# Patient Record
Sex: Male | Born: 1950 | Race: Black or African American | Hispanic: No | Marital: Married | Smoking: Current every day smoker
Health system: Southern US, Community
[De-identification: ages and names within clinical notes are randomized; demographics above are authoritative.]

## PROBLEM LIST (undated history)

## (undated) DIAGNOSIS — I10 Essential (primary) hypertension: Secondary | ICD-10-CM

## (undated) DIAGNOSIS — I639 Cerebral infarction, unspecified: Secondary | ICD-10-CM

## (undated) HISTORY — PX: TOTAL HIP ARTHROPLASTY: SHX124

---

## 1998-07-22 ENCOUNTER — Encounter: Admission: RE | Admit: 1998-07-22 | Discharge: 1998-08-04 | Payer: Self-pay

## 2003-02-20 ENCOUNTER — Encounter: Payer: Self-pay | Admitting: Occupational Medicine

## 2003-02-20 ENCOUNTER — Encounter: Admission: RE | Admit: 2003-02-20 | Discharge: 2003-02-20 | Payer: Self-pay | Admitting: Occupational Medicine

## 2004-05-20 ENCOUNTER — Ambulatory Visit: Payer: Self-pay | Admitting: Urology

## 2005-01-14 ENCOUNTER — Ambulatory Visit: Payer: Self-pay | Admitting: Urology

## 2007-03-15 ENCOUNTER — Ambulatory Visit: Payer: Self-pay | Admitting: General Practice

## 2007-04-04 ENCOUNTER — Ambulatory Visit: Payer: Self-pay | Admitting: Orthopaedic Surgery

## 2007-04-12 ENCOUNTER — Ambulatory Visit: Payer: Self-pay | Admitting: Orthopaedic Surgery

## 2007-04-18 ENCOUNTER — Ambulatory Visit: Payer: Self-pay | Admitting: Orthopaedic Surgery

## 2011-03-09 ENCOUNTER — Emergency Department: Payer: Self-pay | Admitting: Emergency Medicine

## 2011-03-12 ENCOUNTER — Emergency Department: Payer: Self-pay | Admitting: Internal Medicine

## 2012-01-04 ENCOUNTER — Ambulatory Visit: Payer: Self-pay | Admitting: Unknown Physician Specialty

## 2013-06-28 ENCOUNTER — Ambulatory Visit: Payer: Self-pay | Admitting: Unknown Physician Specialty

## 2017-03-25 ENCOUNTER — Encounter: Payer: Self-pay | Admitting: Emergency Medicine

## 2017-03-25 ENCOUNTER — Emergency Department
Admission: EM | Admit: 2017-03-25 | Discharge: 2017-03-25 | Disposition: A | Discharge status: Home / Self Care | Payer: BLUE CROSS/BLUE SHIELD | Attending: Emergency Medicine | Admitting: Emergency Medicine

## 2017-03-25 DIAGNOSIS — H9203 Otalgia, bilateral: Secondary | ICD-10-CM | POA: Diagnosis present

## 2017-03-25 DIAGNOSIS — F17210 Nicotine dependence, cigarettes, uncomplicated: Secondary | ICD-10-CM | POA: Insufficient documentation

## 2017-03-25 DIAGNOSIS — H60503 Unspecified acute noninfective otitis externa, bilateral: Secondary | ICD-10-CM | POA: Diagnosis not present

## 2017-03-25 DIAGNOSIS — J019 Acute sinusitis, unspecified: Secondary | ICD-10-CM | POA: Insufficient documentation

## 2017-03-25 MED ORDER — AMOXICILLIN-POT CLAVULANATE 875-125 MG PO TABS
1.0000 | ORAL_TABLET | Freq: Once | ORAL | Status: AC
  Administered 2017-03-25: 1 via ORAL
  Filled 2017-03-25: qty 1

## 2017-03-25 MED ORDER — CIPROFLOXACIN-DEXAMETHASONE 0.3-0.1 % OT SUSP
4.0000 [drp] | Freq: Once | OTIC | Status: AC
  Administered 2017-03-25: 4 [drp] via OTIC
  Filled 2017-03-25: qty 7.5

## 2017-03-25 MED ORDER — AMOXICILLIN-POT CLAVULANATE 875-125 MG PO TABS: 1.0000 | ORAL_TABLET | Freq: Two times a day (BID) | ORAL | 0 refills | Status: AC

## 2017-03-25 NOTE — ED Notes (Signed)
Reviewed d/c instructions, follow-up care, prescriptions with patient. Patient verbalized understanding.  

## 2017-03-25 NOTE — ED Provider Notes (Signed)
[facility] Provider Note    First MD Initiated Contact with Patient 03/25/17 [phone]     (approximate)  I have reviewed the triage vital signs and the nursing notes.   HISTORY  Chief Complaint Otalgia   HPI Mark Durham is a 66 y.o. male presents to the emergency department with bilateral earache 1 week as well as nasal congestion. Patient denies any cough no dyspnea. Patient denies any fever. Patient states that there are "sores" in his ear.Patient states his current pain score is 10 out of 10   Past medical history none There are no active problems to display for this patient.   Past surgical history None  Prior to Admission medications   Medication Sig Start Date End Date Taking? Authorizing Provider  amoxicillin-clavulanate (AUGMENTIN) 875-125 MG tablet Take 1 tablet by mouth 2 (two) times daily. 03/25/17 04/04/17  Darci Current, MD    Allergies no known drug allergies  No family history on file.  Social History Social History  Substance Use Topics  . Smoking status: Current Every Day Smoker  . Smokeless tobacco: Never Used  . Alcohol use No    Review of Systems Constitutional: No fever/chills Eyes: No visual changes. ENT: No sore throat. Cardiovascular: Denies chest pain. Respiratory: Denies shortness of breath. Gastrointestinal: No abdominal pain.  No nausea, no vomiting.  No diarrhea.  No constipation. Genitourinary: Negative for dysuria. Musculoskeletal: Negative for neck pain.  Negative for back pain. Integumentary: Negative for rash. Neurological: Negative for headaches, focal weakness or numbness.   ____________________________________________   PHYSICAL EXAM:  VITAL SIGNS: ED Triage Vitals  Enc Vitals Group     BP 03/25/17 0431 (!) 173/105     Pulse Rate 03/25/17 0431 96     Resp 03/25/17 0431 19     Temp 03/25/17 0432 98.6 F (37 C)     Temp Source 03/25/17 0432 Oral     SpO2  03/25/17 0431 97 %     Weight 03/25/17 0426 97.5 kg (215 lb)     Height 03/25/17 0426 1.778 m ( )     Head Circumference --      Peak Flow --      Pain Score 03/25/17 0426 10     Pain Loc --      Pain Edu? --      Excl. in GC? --     Constitutional: Alert and oriented. Well appearing and in no acute distress. Eyes: Conjunctivae are normal.  Head: Atraumatic. Ears:  external auditory canal erythema and exudate noted bilaterally. Nose: No congestion/rhinnorhea. Mouth/Throat: Mucous membranes are moist.  Oropharynx non-erythematous. Neck: No stridor.   Cardiovascular: Normal rate, regular rhythm. Good peripheral circulation. Grossly normal heart sounds. Respiratory: Normal respiratory effort.  No retractions. Lungs CTAB. Gastrointestinal: Soft and nontender. No distention.  Musculoskeletal: No lower extremity tenderness nor edema. No gross deformities of extremities. Neurologic:  Normal speech and language. No gross focal neurologic deficits are appreciated.  Skin:  Skin is warm, dry and intact. No rash noted.    Procedures   ____________________________________________   INITIAL IMPRESSION / ASSESSMENT AND PLAN / ED COURSE  Pertinent labs & imaging results that were available during my care of the patient were reviewed by me and considered in my medical decision making (see chart for details).  Ciprodex and Augmentin given.      ____________________________________________  FINAL CLINICAL IMPRESSION(S) / ED DIAGNOSES  Final diagnoses:  Acute otitis externa  of both ears, unspecified type  Acute sinusitis, recurrence not specified, unspecified location     MEDICATIONS GIVEN DURING THIS VISIT:  Medications  ciprofloxacin-dexamethasone (CIPRODEX) 0.3-0.1 % OTIC (EAR) suspension 4 drop (not administered)  amoxicillin-clavulanate (AUGMENTIN) 875-125 MG per tablet 1 tablet (not administered)     NEW OUTPATIENT MEDICATIONS STARTED DURING THIS VISIT:  New  Prescriptions   AMOXICILLIN-CLAVULANATE (AUGMENTIN) 875-125 MG TABLET    Take 1 tablet by mouth 2 (two) times daily.    Modified Medications   No medications on file    Discontinued Medications   No medications on file     Note:  This document was prepared using Dragon voice recognition software and may include unintentional dictation errors.    Darci Current, MD 03/25/17 [phone]

## 2017-03-25 NOTE — ED Triage Notes (Signed)
Patient ambulatory to triage with steady gait, without difficulty or distress noted; pt reports bilat earache x week with no recent illness; st "feels stopped up"

## 2017-09-20 ENCOUNTER — Encounter: Payer: Self-pay | Admitting: Emergency Medicine

## 2017-09-20 ENCOUNTER — Other Ambulatory Visit: Payer: Self-pay

## 2017-09-20 DIAGNOSIS — R2231 Localized swelling, mass and lump, right upper limb: Secondary | ICD-10-CM | POA: Insufficient documentation

## 2017-09-20 DIAGNOSIS — Z5321 Procedure and treatment not carried out due to patient leaving prior to being seen by health care provider: Secondary | ICD-10-CM | POA: Diagnosis not present

## 2017-09-20 LAB — CBC WITH DIFFERENTIAL/PLATELET
BASOS ABS: 0.1 10*3/uL (ref 0–0.1)
BASOS PCT: 1 %
Eosinophils Absolute: 0.2 10*3/uL (ref 0–0.7)
Eosinophils Relative: 2 %
HCT: 42.8 % (ref 40.0–52.0)
Hemoglobin: 14 g/dL (ref 13.0–18.0)
Lymphocytes Relative: 34 %
Lymphs Abs: 2.9 10*3/uL (ref 1.0–3.6)
MCH: 27.5 pg (ref 26.0–34.0)
MCHC: 32.7 g/dL (ref 32.0–36.0)
MCV: 84 fL (ref 80.0–100.0)
MONO ABS: 0.6 10*3/uL (ref 0.2–1.0)
Monocytes Relative: 7 %
NEUTROS ABS: 4.8 10*3/uL (ref 1.4–6.5)
NEUTROS PCT: 56 %
Platelets: 260 10*3/uL (ref 150–440)
RBC: 5.1 MIL/uL (ref 4.40–5.90)
RDW: 14.2 % (ref 11.5–14.5)
WBC: 8.5 10*3/uL (ref 3.8–10.6)

## 2017-09-20 NOTE — ED Triage Notes (Signed)
Patient ambulatory to triage with steady gait, without difficulty or distress noted; pt reports possible spider bite after moving boxes last wk; area of redness noted to right FA just above wrist with scabbed center; area of redness marked in triage

## 2017-09-21 ENCOUNTER — Emergency Department
Admission: EM | Admit: 2017-09-21 | Discharge: 2017-09-21 | Disposition: A | Discharge status: Home / Self Care | Payer: BLUE CROSS/BLUE SHIELD | Attending: Emergency Medicine | Admitting: Emergency Medicine

## 2017-09-21 LAB — COMPREHENSIVE METABOLIC PANEL
ALT: 24 U/L (ref 17–63)
ANION GAP: 10 (ref 5–15)
AST: 23 U/L (ref 15–41)
Albumin: 3.9 g/dL (ref 3.5–5.0)
Alkaline Phosphatase: 98 U/L (ref 38–126)
BILIRUBIN TOTAL: 0.7 mg/dL (ref 0.3–1.2)
BUN: 15 mg/dL (ref 6–20)
CO2: 26 mmol/L (ref 22–32)
Calcium: 8.9 mg/dL (ref 8.9–10.3)
Chloride: 104 mmol/L (ref 101–111)
Creatinine, Ser: 1.18 mg/dL (ref 0.61–1.24)
GFR calc Af Amer: >60 mL/min (ref >60)
Glucose, Bld: 111 mg/dL — ABNORMAL HIGH (ref 65–99)
POTASSIUM: 3.4 mmol/L — AB (ref 3.5–5.1)
Sodium: 140 mmol/L (ref 135–145)
TOTAL PROTEIN: 8.3 g/dL — AB (ref 6.5–8.1)

## 2018-02-14 DIAGNOSIS — Z86010 Personal history of colonic polyps: Secondary | ICD-10-CM | POA: Insufficient documentation

## 2018-02-14 DIAGNOSIS — Z8719 Personal history of other diseases of the digestive system: Secondary | ICD-10-CM | POA: Insufficient documentation

## 2018-11-13 DIAGNOSIS — M179 Osteoarthritis of knee, unspecified: Secondary | ICD-10-CM | POA: Insufficient documentation

## 2019-11-23 DIAGNOSIS — G8929 Other chronic pain: Secondary | ICD-10-CM | POA: Insufficient documentation

## 2020-01-24 DIAGNOSIS — M1611 Unilateral primary osteoarthritis, right hip: Secondary | ICD-10-CM | POA: Insufficient documentation

## 2020-03-04 DIAGNOSIS — Z96641 Presence of right artificial hip joint: Secondary | ICD-10-CM | POA: Insufficient documentation

## 2020-07-31 ENCOUNTER — Encounter: Payer: Self-pay | Admitting: Emergency Medicine

## 2020-07-31 ENCOUNTER — Emergency Department
Admission: EM | Admit: 2020-07-31 | Discharge: 2020-07-31 | Disposition: A | Discharge status: Home / Self Care | Payer: BC Managed Care – PPO | Attending: Emergency Medicine | Admitting: Emergency Medicine

## 2020-07-31 ENCOUNTER — Other Ambulatory Visit: Payer: Self-pay

## 2020-07-31 ENCOUNTER — Emergency Department: Payer: BC Managed Care – PPO

## 2020-07-31 DIAGNOSIS — Z96641 Presence of right artificial hip joint: Secondary | ICD-10-CM | POA: Insufficient documentation

## 2020-07-31 DIAGNOSIS — Z79899 Other long term (current) drug therapy: Secondary | ICD-10-CM | POA: Insufficient documentation

## 2020-07-31 DIAGNOSIS — I10 Essential (primary) hypertension: Secondary | ICD-10-CM | POA: Diagnosis not present

## 2020-07-31 DIAGNOSIS — H60332 Swimmer's ear, left ear: Secondary | ICD-10-CM | POA: Insufficient documentation

## 2020-07-31 DIAGNOSIS — R42 Dizziness and giddiness: Secondary | ICD-10-CM | POA: Insufficient documentation

## 2020-07-31 DIAGNOSIS — F17200 Nicotine dependence, unspecified, uncomplicated: Secondary | ICD-10-CM | POA: Insufficient documentation

## 2020-07-31 DIAGNOSIS — H9202 Otalgia, left ear: Secondary | ICD-10-CM | POA: Diagnosis present

## 2020-07-31 HISTORY — DX: Essential (primary) hypertension: I10

## 2020-07-31 LAB — COMPREHENSIVE METABOLIC PANEL
ALT: 16 U/L (ref 0–44)
AST: 16 U/L (ref 15–41)
Albumin: 3.5 g/dL (ref 3.5–5.0)
Alkaline Phosphatase: 106 U/L (ref 38–126)
Anion gap: 8 (ref 5–15)
BUN: 14 mg/dL (ref 8–23)
CO2: 27 mmol/L (ref 22–32)
Calcium: 9.3 mg/dL (ref 8.9–10.3)
Chloride: 106 mmol/L (ref 98–111)
Creatinine, Ser: 0.9 mg/dL (ref 0.61–1.24)
GFR, Estimated: >60 mL/min (ref >60)
Glucose, Bld: 105 mg/dL — ABNORMAL HIGH (ref 70–99)
Potassium: 3.2 mmol/L — ABNORMAL LOW (ref 3.5–5.1)
Sodium: 141 mmol/L (ref 135–145)
Total Bilirubin: 0.9 mg/dL (ref 0.3–1.2)
Total Protein: 8 g/dL (ref 6.5–8.1)

## 2020-07-31 LAB — CBC WITH DIFFERENTIAL/PLATELET
Abs Immature Granulocytes: 0.02 10*3/uL (ref 0.00–0.07)
Basophils Absolute: 0 10*3/uL (ref 0.0–0.1)
Basophils Relative: 0 %
Eosinophils Absolute: 0.2 10*3/uL (ref 0.0–0.5)
Eosinophils Relative: 3 %
HCT: 37.7 % — ABNORMAL LOW (ref 39.0–52.0)
Hemoglobin: 11.3 g/dL — ABNORMAL LOW (ref 13.0–17.0)
Immature Granulocytes: 0 %
Lymphocytes Relative: 39 %
Lymphs Abs: 2.8 10*3/uL (ref 0.7–4.0)
MCH: 23 pg — ABNORMAL LOW (ref 26.0–34.0)
MCHC: 30 g/dL (ref 30.0–36.0)
MCV: 76.8 fL — ABNORMAL LOW (ref 80.0–100.0)
Monocytes Absolute: 0.8 10*3/uL (ref 0.1–1.0)
Monocytes Relative: 11 %
Neutro Abs: 3.4 10*3/uL (ref 1.7–7.7)
Neutrophils Relative %: 47 %
Platelets: 269 10*3/uL (ref 150–400)
RBC: 4.91 MIL/uL (ref 4.22–5.81)
RDW: 15.6 % — ABNORMAL HIGH (ref 11.5–15.5)
WBC: 7.3 10*3/uL (ref 4.0–10.5)
nRBC: 0 % (ref 0.0–0.2)

## 2020-07-31 LAB — APTT: aPTT: 32 seconds (ref 24–36)

## 2020-07-31 LAB — TROPONIN I (HIGH SENSITIVITY)
Troponin I (High Sensitivity): 7 ng/L (ref <18)
Troponin I (High Sensitivity): 7 ng/L (ref <18)

## 2020-07-31 LAB — PROTIME-INR
INR: 1.1 (ref 0.8–1.2)
Prothrombin Time: 14.1 seconds (ref 11.4–15.2)

## 2020-07-31 MED ORDER — FENTANYL CITRATE (PF) 100 MCG/2ML IJ SOLN
50.0000 ug | Freq: Once | INTRAMUSCULAR | Status: AC
  Administered 2020-07-31: 50 ug via INTRAVENOUS
  Filled 2020-07-31: qty 2

## 2020-07-31 MED ORDER — ONDANSETRON HCL 4 MG/2ML IJ SOLN
4.0000 mg | Freq: Once | INTRAMUSCULAR | Status: AC
  Administered 2020-07-31: 4 mg via INTRAVENOUS
  Filled 2020-07-31: qty 2

## 2020-07-31 MED ORDER — CIPROFLOXACIN-DEXAMETHASONE 0.3-0.1 % OT SUSP: 4.0000 [drp] | Freq: Two times a day (BID) | OTIC | 0 refills | Status: AC

## 2020-07-31 MED ORDER — AMLODIPINE BESYLATE 5 MG PO TABS: 5.0000 mg | ORAL_TABLET | Freq: Every day | ORAL | 11 refills | Status: DC

## 2020-07-31 MED ORDER — IOHEXOL 350 MG/ML SOLN
75.0000 mL | Freq: Once | INTRAVENOUS | Status: AC | PRN
  Administered 2020-07-31: 75 mL via INTRAVENOUS

## 2020-07-31 NOTE — ED Provider Notes (Signed)
[facility] Emergency Department Provider Note   ____________________________________________   Event Date/Time   First MD Initiated Contact with Patient 07/31/20 [phone]     (approximate)  I have reviewed the triage vital signs and the nursing notes.   HISTORY  Chief Complaint No chief complaint on file.    HPI Mark Durham is a 70 y.o. male with a stated past medical history of hypertension who presents for left earache.  Patient states that his left ear has been aching/throbbing for the last 2 days.  Patient states this pain has been worsening over this time and is now 8/10 and up the left side of his head.  Patient has been trying over-the-counter eardrops for symptomatic control with minimal improvement.  Patient states that palpation worsens this pain.  Patient also endorses transient episodic orthostatic lightheadedness.  Patient states that it lasts approximately 2-4 minutes and resolve spontaneously after standing up from seated position.  Patient states that he is only on lisinopril for his blood pressure and he does take it every day.  Patient states that he takes his blood pressure every day and notes that the systolic blood pressures are usually 180-220.  Patient currently denies any vision changes, tinnitus, difficulty speaking, facial droop, sore throat, chest pain, shortness of breath, abdominal pain, nausea/vomiting/diarrhea, dysuria, or weakness/numbness/paresthesias in any extremity         Past Medical History:  Diagnosis Date  . Hypertension     There are no problems to display for this patient.   Past Surgical History:  Procedure Laterality Date  . TOTAL HIP ARTHROPLASTY Right     Prior to Admission medications   Medication Sig Start Date End Date Taking? Authorizing Provider  amLODipine (NORVASC) 5 MG tablet Take 1 tablet (5 mg total) by mouth daily. 07/31/20 07/31/21 Yes Johathon Overturf, Clent Jacks, MD  ciprofloxacin-dexamethasone  (CIPRODEX) OTIC suspension Place 4 drops into the left ear 2 (two) times daily for 10 days. 07/31/20 08/10/20 Yes Merwyn Katos, MD    Allergies Patient has no known allergies.  No family history on file.  Social History Social History   Tobacco Use  . Smoking status: Current Every Day Smoker  . Smokeless tobacco: Never Used  Vaping Use  . Vaping Use: Never used  Substance Use Topics  . Alcohol use: No    Review of Systems Constitutional: No fever/chills Eyes: No visual changes. ENT: No sore throat. Ears: Endorses left ear pain Cardiovascular: Denies chest pain. Respiratory: Denies shortness of breath. Gastrointestinal: No abdominal pain.  No nausea, no vomiting.  No diarrhea. Genitourinary: Negative for dysuria. Musculoskeletal: Negative for acute arthralgias Skin: Negative for rash. Neurological: Negative for headaches, weakness/numbness/paresthesias in any extremity Psychiatric: Negative for suicidal ideation/homicidal ideation   ____________________________________________   PHYSICAL EXAM:  VITAL SIGNS: ED Triage Vitals  Enc Vitals Group     BP 07/31/20 0342 (!) 203/110     Pulse Rate 07/31/20 0342 88     Resp 07/31/20 0342 18     Temp 07/31/20 0342 97.8 F (36.6 C)     Temp Source 07/31/20 0342 Oral     SpO2 07/31/20 0342 96 %     Weight 07/31/20 0346 220 lb (99.8 kg)     Height 07/31/20 0346 5\' 10"  (1.778 m)     Head Circumference --      Peak Flow --      Pain Score 07/31/20 0345 10     Pain Loc --  Pain Edu? --      Excl. in GC? --    Constitutional: Alert and oriented. Well appearing and in no acute distress. Eyes: Conjunctivae are normal. PERRL. Head: Atraumatic. Ears: Left ear with external auditory canal erythematous, edematous, indurated, and with severe tenderness to palpation Nose: No congestion/rhinnorhea. Mouth/Throat: Mucous membranes are moist. Neck: No stridor Cardiovascular: Grossly normal heart sounds.  Good peripheral  circulation. Respiratory: Normal respiratory effort.  No retractions. Gastrointestinal: Soft and nontender. No distention. Musculoskeletal: No obvious deformities Neurologic:  Normal speech and language. No gross focal neurologic deficits are appreciated. Skin:  Skin is warm and dry. No rash noted. Psychiatric: Mood and affect are normal. Speech and behavior are normal.  ____________________________________________   LABS (all labs ordered are listed, but only abnormal results are displayed)  Labs Reviewed  CBC WITH DIFFERENTIAL/PLATELET - Abnormal; Notable for the following components:      Result Value   Hemoglobin 11.3 (*)    HCT 37.7 (*)    MCV 76.8 (*)    MCH 23.0 (*)    RDW 15.6 (*)    All other components within normal limits  COMPREHENSIVE METABOLIC PANEL - Abnormal; Notable for the following components:   Potassium 3.2 (*)    Glucose, Bld 105 (*)    All other components within normal limits  PROTIME-INR  APTT  TROPONIN I (HIGH SENSITIVITY)  TROPONIN I (HIGH SENSITIVITY)   ____________________________________________  EKG  ED ECG REPORT I, Merwyn KatosEvan K Torina Ey, the attending physician, personally viewed and interpreted this ECG.  Date: 07/31/2020 EKG Time: 0343 Rate: 85 Rhythm: normal sinus rhythm QRS Axis: normal Intervals: normal ST/T Wave abnormalities: normal Narrative Interpretation: no evidence of acute ischemia  ____________________________________________  RADIOLOGY  ED MD interpretation: CT angiography of the head and neck shows high-grade left V4, bilateral MCA, and left PCA narrowings  CT without contrast of the head shows no evidence of acute abnormalities including no active hemorrhage, edema, or obvious mass.  Patient does show signs of chronic microvascular disease  Official radiology report(s): CT Angio Head W or Wo Contrast  Result Date: 07/31/2020 CLINICAL DATA:  Nonspecific dizziness.  Headache. EXAM: CT ANGIOGRAPHY HEAD AND NECK  TECHNIQUE: Multidetector CT imaging of the head and neck was performed using the standard protocol during bolus administration of intravenous contrast. Multiplanar CT image reconstructions and MIPs were obtained to evaluate the vascular anatomy. Carotid stenosis measurements (when applicable) are obtained utilizing NASCET criteria, using the distal internal carotid diameter as the denominator. CONTRAST:  75mL OMNIPAQUE IOHEXOL 350 MG/ML SOLN COMPARISON:  Head CT 03/10/2011 FINDINGS: CT HEAD FINDINGS Brain: No evidence of acute infarction, hemorrhage, hydrocephalus, extra-axial collection or mass lesion/mass effect. Brain atrophy with ventriculomegaly, progressed from 2012. Chronic small vessel ischemic type low-density in the cerebral white matter which is confluent in some locations, also progressed. Vascular: See below Skull: Normal. Negative for fracture or focal lesion. Sinuses: Clear. Orbits: History of left ear pain. No convincing mastoid or middle ear opacification. Review of the MIP images confirms the above findings CTA NECK FINDINGS Aortic arch: No acute finding. Normal diameter. Coronary atherosclerosis is present. Right carotid system: Low-density atheromatous wall thickening of the common carotid and proximal ICA. There is a medially directed outpouching from the proximal left ICA which measures 3 mm in diameter. No dissection or flow limiting stenosis. Left carotid system: Atheromatous wall thickening of the common carotid and proximal ICA. No stenosis or ulceration. Vertebral arteries: Mild atheromatous changes to the proximal subclavian arteries. Right  vertebral artery dominance. No vertebral dissection, beading, or flow limiting stenosis. Skeleton: Ordinary degenerative changes in the cervical spine. Other neck: Larger right thyroid lobe without discrete visualized mass. Upper chest: No acute finding. Paraseptal emphysema. Partially covered nodule in the left upper lobe measuring 8 mm. There is an  internal calcification which appears small and eccentric based on the coverage. Review of the MIP images confirms the above findings CTA HEAD FINDINGS Anterior circulation: Diffuse atheromatous change to the carotid siphons. No measured flow reducing stenosis. High-grade bilateral M1 segment stenosis fusiform appearance attributed to atherosclerosis, see coronal reformats. No major branch occlusion is seen. Evaluation of medium and distal vessels is limited by venous timing. Posterior circulation: Right dominant vertebral artery. Atheromatous irregularity of the left V4 segment with high-grade stenosis before the basilar. Mild atheromatous narrowing of the basilar. Bilateral PE a 2/3 segment stenosis, high-grade appearing on the left. No major branch occlusion or aneurysm seen. Venous sinuses: Diffusely patent Anatomic variants: None significant Review of the MIP images confirms the above findings IMPRESSION: Head CT: 1. No acute finding. 2. 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 CTA of the neck: 1. No emergent finding. 2. Atherosclerosis of the cervical carotids without flow reducing stenosis. There is a notable outpouching from the right ICA bulb consistent with ulcerated plaque. 3. Partially covered 8 mm nodule in the left upper lobe. There is an internal calcification favoring granulomatous disease, but partially covered. Recommend non emergent chest CT without contrast. CTA of the head: 1. No emergent finding. 2. Atherosclerosis with high-grade left V4, bilateral MCA and left PCA narrowings. Electronically Signed   By: Marnee Spring M.D.   On: 07/31/2020 06:21   CT Angio Neck W and/or Wo Contrast  Result Date: 07/31/2020 CLINICAL DATA:  Nonspecific dizziness.  Headache. EXAM: CT ANGIOGRAPHY HEAD AND NECK TECHNIQUE: Multidetector CT imaging of the head and neck was performed using the standard protocol during bolus administration of intravenous  contrast. Multiplanar CT image reconstructions and MIPs were obtained to evaluate the vascular anatomy. Carotid stenosis measurements (when applicable) are obtained utilizing NASCET criteria, using the distal internal carotid diameter as the denominator. CONTRAST:  16mL OMNIPAQUE IOHEXOL 350 MG/ML SOLN COMPARISON:  Head CT 03/10/2011 FINDINGS: CT HEAD FINDINGS Brain: No evidence of acute infarction, hemorrhage, hydrocephalus, extra-axial collection or mass lesion/mass effect. Brain atrophy with ventriculomegaly, progressed from 2012. Chronic small vessel ischemic type low-density in the cerebral white matter which is confluent in some locations, also progressed. Vascular: See below Skull: Normal. Negative for fracture or focal lesion. Sinuses: Clear. Orbits: History of left ear pain. No convincing mastoid or middle ear opacification. Review of the MIP images confirms the above findings CTA NECK FINDINGS Aortic arch: No acute finding. Normal diameter. Coronary atherosclerosis is present. Right carotid system: Low-density atheromatous wall thickening of the common carotid and proximal ICA. There is a medially directed outpouching from the proximal left ICA which measures 3 mm in diameter. No dissection or flow limiting stenosis. Left carotid system: Atheromatous wall thickening of the common carotid and proximal ICA. No stenosis or ulceration. Vertebral arteries: Mild atheromatous changes to the proximal subclavian arteries. Right vertebral artery dominance. No vertebral dissection, beading, or flow limiting stenosis. Skeleton: Ordinary degenerative changes in the cervical spine. Other neck: Larger right thyroid lobe without discrete visualized mass. Upper chest: No acute finding. Paraseptal emphysema. Partially covered nodule in the left upper lobe measuring 8 mm. There is an internal calcification which appears small  and eccentric based on the coverage. Review of the MIP images confirms the above findings CTA HEAD  FINDINGS Anterior circulation: Diffuse atheromatous change to the carotid siphons. No measured flow reducing stenosis. High-grade bilateral M1 segment stenosis fusiform appearance attributed to atherosclerosis, see coronal reformats. No major branch occlusion is seen. Evaluation of medium and distal vessels is limited by venous timing. Posterior circulation: Right dominant vertebral artery. Atheromatous irregularity of the left V4 segment with high-grade stenosis before the basilar. Mild atheromatous narrowing of the basilar. Bilateral PE a 2/3 segment stenosis, high-grade appearing on the left. No major branch occlusion or aneurysm seen. Venous sinuses: Diffusely patent Anatomic variants: None significant Review of the MIP images confirms the above findings IMPRESSION: Head CT: 1. No acute finding. 2. 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 CTA of the neck: 1. No emergent finding. 2. Atherosclerosis of the cervical carotids without flow reducing stenosis. There is a notable outpouching from the right ICA bulb consistent with ulcerated plaque. 3. Partially covered 8 mm nodule in the left upper lobe. There is an internal calcification favoring granulomatous disease, but partially covered. Recommend non emergent chest CT without contrast. CTA of the head: 1. No emergent finding. 2. Atherosclerosis with high-grade left V4, bilateral MCA and left PCA narrowings. Electronically Signed   By: Marnee SpringJonathon  Watts M.D.   On: 07/31/2020 06:21    ____________________________________________   PROCEDURES  Procedure(s) performed (including Critical Care):  .1-3 Lead EKG Interpretation Performed by: Merwyn KatosBradler, Talayah Picardi K, MD Authorized by: Merwyn KatosBradler, Cortnee Steinmiller K, MD     Interpretation: normal     ECG rate:  78   ECG rate assessment: normal     Rhythm: sinus rhythm     Ectopy: none     Conduction: normal       ____________________________________________   INITIAL  IMPRESSION / ASSESSMENT AND PLAN / ED COURSE  As part of my medical decision making, I reviewed the following data within the electronic MEDICAL RECORD NUMBER Nursing notes reviewed and incorporated, Labs reviewed, EKG interpreted, Old chart reviewed, Radiograph reviewed and Notes from prior ED visits reviewed and incorporated      Exam and history are most consistent with Otitis Externa. No diabetes, immunosuppression. Low suspicion for mastoiditis, malignant otitis externa, AOM, herpes. Patient also has evidence of high blood pressure. Patient is otherwise asymptomatic without confusion, chest pain, hematuria, or SOB. DDx: CV, AMI, heart failure, renal infarction or failure or other end organ damage.  Disposition: Discussed with patient their elevated blood pressure and need for close outpatient management of their hypertension. Will provide a prescription for amlodipine 5mg  PO daily and arrange for the patient to follow up in a primary care clinic Rx: CiproDex [4 drops instilled into the affected ear twice daily for seven days] for inflammatory relief and infection control.       ____________________________________________   FINAL CLINICAL IMPRESSION(S) / ED DIAGNOSES  Final diagnoses:  Primary hypertension  Lightheadedness  Acute swimmer's ear of left side     ED Discharge Orders         Ordered    amLODipine (NORVASC) 5 MG tablet  Daily        07/31/20 0805    ciprofloxacin-dexamethasone (CIPRODEX) OTIC suspension  2 times daily        07/31/20 0805           Note:  This document was prepared using Dragon voice recognition software and may include unintentional dictation errors.  Merwyn Katos, MD 07/31/20 [phone]

## 2020-07-31 NOTE — ED Triage Notes (Addendum)
Patient ambulatory to triage with steady gait, without difficulty or distress noted; pt reports left earache x 2 days then began radiating to into left side of head "like a headache" at midnight tonight; denies any recent illness; denies any accomp symptoms; pt A&Ox3, MAEW, grips + & strong

## 2020-07-31 NOTE — ED Notes (Signed)
ED Provider at bedside. 

## 2021-02-26 DIAGNOSIS — K219 Gastro-esophageal reflux disease without esophagitis: Secondary | ICD-10-CM | POA: Insufficient documentation

## 2021-04-15 ENCOUNTER — Ambulatory Visit (INDEPENDENT_AMBULATORY_CARE_PROVIDER_SITE_OTHER): Payer: BC Managed Care – PPO | Admitting: Urology

## 2021-04-15 ENCOUNTER — Encounter: Payer: Self-pay | Admitting: Urology

## 2021-04-15 ENCOUNTER — Other Ambulatory Visit: Payer: Self-pay

## 2021-04-15 VITALS — BP 175/88 | HR 88 | Ht 70.5 in | Wt 205.0 lb

## 2021-04-15 DIAGNOSIS — R350 Frequency of micturition: Secondary | ICD-10-CM

## 2021-04-15 DIAGNOSIS — Z125 Encounter for screening for malignant neoplasm of prostate: Secondary | ICD-10-CM

## 2021-04-15 DIAGNOSIS — R399 Unspecified symptoms and signs involving the genitourinary system: Secondary | ICD-10-CM | POA: Diagnosis not present

## 2021-04-15 DIAGNOSIS — N529 Male erectile dysfunction, unspecified: Secondary | ICD-10-CM

## 2021-04-15 LAB — BLADDER SCAN AMB NON-IMAGING

## 2021-04-15 MED ORDER — TADALAFIL 5 MG PO TABS: 5.0000 mg | ORAL_TABLET | Freq: Every day | ORAL | 11 refills | Status: DC

## 2021-04-15 NOTE — Progress Notes (Signed)
   04/15/21 9:29 AM   Mark Durham 1950-08-23 [id number]  CC: Lower urinary tract symptoms, ED, PSA screening  HPI: 70 year old male with the above issues.  He reports about a year of trouble with erections, he has never tried medications for this.  He is unable to achieve erection sufficient for sexual activity at this time.  He also reports a few years of urinary symptoms including nocturia 3 times per night, and some mild to moderate urgency during the day.  He gets mild to moderate ankle swelling at the end of the day.  He denies any gross hematuria, UTIs, or history of retention.  He drinks primarily water during the day with some ginger ale, minimal alcohol intake.  Last PSA was normal at 0.47 in September 2020.  He denies any family history of prostate cancer  Urinalysis today is completely benign, and PVR is normal at 65 mL.   PMH: Past Medical History:  Diagnosis Date   Hypertension     Surgical History: Past Surgical History:  Procedure Laterality Date   TOTAL HIP ARTHROPLASTY Right      Social History:  reports that he has been smoking. He has never used smokeless tobacco. He reports that he does not drink alcohol. No history on file for drug use.  Physical Exam: BP (!) 175/88   Pulse 88   Ht 5' 10.5" (1.791 m)   Wt 205 lb (93 kg)   BMI 29.00 kg/m    Constitutional:  Alert and oriented, No acute distress. Cardiovascular: No clubbing, cyanosis, or edema. Respiratory: Normal respiratory effort, no increased work of breathing. GI: Abdomen is soft, nontender, nondistended, no abdominal masses GU: Uncircumcised phallus with patent meatus, no lesions, testicles 20 cc and descended bilaterally without masses DRE: 30 g, smooth, no nodules or masses   Assessment & Plan:   70 year old male with 1 year of erectile dysfunction as well as a few years of urinary symptoms with urinary frequency/urgency during the day, nocturia 3 times overnight.  Urinalysis and PVR  benign.  I recommended a trial of Cialis for both his erections and urinary symptoms.  We also discussed behavioral strategies at length including avoiding bladder irritants, compression stockings, elevating the legs before bedtime, minimizing fluids before bed, and double voiding prior to bedtime.  Trial of Cialis for ED and BPH 5 mg daily Consider Flomax or OAB medication in the future if worsening urinary symptoms despite Cialis RTC 6 weeks symptom check and PVR, consider a.m. testosterone if persistent ED   Legrand Rams, MD 04/15/2021  [facility] [address] [place], [place] [address] [phone]

## 2021-04-15 NOTE — Patient Instructions (Signed)
Minimize fluids 4 to 5 hours before bedtime, and urinate prior to going to bed.  Avoid sodas and diet drinks, as these can cause urinary urgency and frequency.  Consider wearing compression socks on your lower legs during the day, as this can also help prevent overnight urination.  Also consider elevating your legs in the afternoon which can help prevent overnight urination.  Cialis can help with both the erections and the urinary symptoms.  We will start at the lowest dose of 5 mg, but can consider increasing if needed.  Follow-up in 1 month for symptom check

## 2021-04-16 LAB — URINALYSIS, COMPLETE
Bilirubin, UA: NEGATIVE
Glucose, UA: NEGATIVE
Ketones, UA: NEGATIVE
Leukocytes,UA: NEGATIVE
Nitrite, UA: NEGATIVE
Protein,UA: NEGATIVE
RBC, UA: NEGATIVE
Specific Gravity, UA: 1.02 (ref 1.005–1.030)
Urobilinogen, Ur: 4 mg/dL — ABNORMAL HIGH (ref 0.2–1.0)
pH, UA: 7 (ref 5.0–7.5)

## 2021-04-16 LAB — MICROSCOPIC EXAMINATION
Bacteria, UA: NONE SEEN
Epithelial Cells (non renal): NONE SEEN /hpf (ref 0–10)

## 2021-05-27 ENCOUNTER — Ambulatory Visit: Payer: BC Managed Care – PPO | Admitting: Urology

## 2021-05-28 ENCOUNTER — Encounter: Payer: Self-pay | Admitting: Urology

## 2021-11-16 ENCOUNTER — Other Ambulatory Visit: Payer: Self-pay

## 2021-11-16 DIAGNOSIS — R1032 Left lower quadrant pain: Secondary | ICD-10-CM | POA: Diagnosis present

## 2021-11-16 DIAGNOSIS — E876 Hypokalemia: Secondary | ICD-10-CM | POA: Insufficient documentation

## 2021-11-16 DIAGNOSIS — I10 Essential (primary) hypertension: Secondary | ICD-10-CM | POA: Insufficient documentation

## 2021-11-16 DIAGNOSIS — L03311 Cellulitis of abdominal wall: Secondary | ICD-10-CM | POA: Insufficient documentation

## 2021-11-16 DIAGNOSIS — F17200 Nicotine dependence, unspecified, uncomplicated: Secondary | ICD-10-CM | POA: Insufficient documentation

## 2021-11-16 LAB — COMPREHENSIVE METABOLIC PANEL
ALT: 18 U/L (ref 0–44)
AST: 23 U/L (ref 15–41)
Albumin: 3.8 g/dL (ref 3.5–5.0)
Alkaline Phosphatase: 91 U/L (ref 38–126)
Anion gap: 6 (ref 5–15)
BUN: 12 mg/dL (ref 8–23)
CO2: 26 mmol/L (ref 22–32)
Calcium: 9.3 mg/dL (ref 8.9–10.3)
Chloride: 106 mmol/L (ref 98–111)
Creatinine, Ser: 1.14 mg/dL (ref 0.61–1.24)
GFR, Estimated: >60 mL/min (ref >60)
Glucose, Bld: 130 mg/dL — ABNORMAL HIGH (ref 70–99)
Potassium: 3.3 mmol/L — ABNORMAL LOW (ref 3.5–5.1)
Sodium: 138 mmol/L (ref 135–145)
Total Bilirubin: 1 mg/dL (ref 0.3–1.2)
Total Protein: 8 g/dL (ref 6.5–8.1)

## 2021-11-16 LAB — CBC
HCT: 38.3 % — ABNORMAL LOW (ref 39.0–52.0)
Hemoglobin: 11.1 g/dL — ABNORMAL LOW (ref 13.0–17.0)
MCH: 21.4 pg — ABNORMAL LOW (ref 26.0–34.0)
MCHC: 29 g/dL — ABNORMAL LOW (ref 30.0–36.0)
MCV: 73.8 fL — ABNORMAL LOW (ref 80.0–100.0)
Platelets: 208 10*3/uL (ref 150–400)
RBC: 5.19 MIL/uL (ref 4.22–5.81)
RDW: 16.1 % — ABNORMAL HIGH (ref 11.5–15.5)
WBC: 7.7 10*3/uL (ref 4.0–10.5)
nRBC: 0 % (ref 0.0–0.2)

## 2021-11-16 NOTE — ED Triage Notes (Signed)
Ambulatory to triage with c/o Spider bite to left lower abd. Pt thinks it Happened last week. Pt states he initially thought it was a mosquito bite. Open wound present, with warmth, redness, and edema noted to surrounding area. Pt states wound does drain a bloody like fluid.  ?

## 2021-11-17 ENCOUNTER — Emergency Department
Admission: EM | Admit: 2021-11-17 | Discharge: 2021-11-17 | Disposition: A | Discharge status: Home / Self Care | Payer: BC Managed Care – PPO | Attending: Emergency Medicine | Admitting: Emergency Medicine

## 2021-11-17 DIAGNOSIS — E876 Hypokalemia: Secondary | ICD-10-CM

## 2021-11-17 DIAGNOSIS — L03311 Cellulitis of abdominal wall: Secondary | ICD-10-CM

## 2021-11-17 DIAGNOSIS — I10 Essential (primary) hypertension: Secondary | ICD-10-CM

## 2021-11-17 MED ORDER — AMLODIPINE BESYLATE 5 MG PO TABS: 5.0000 mg | ORAL_TABLET | Freq: Every day | ORAL | 11 refills | Status: DC

## 2021-11-17 MED ORDER — DOXYCYCLINE HYCLATE 100 MG PO CAPS: 100.0000 mg | ORAL_CAPSULE | Freq: Two times a day (BID) | ORAL | 0 refills | Status: AC

## 2021-11-17 MED ORDER — CEFTRIAXONE SODIUM 1 G IJ SOLR
1.0000 g | Freq: Once | INTRAMUSCULAR | Status: AC
  Administered 2021-11-17: 1 g via INTRAMUSCULAR
  Filled 2021-11-17: qty 10

## 2021-11-17 MED ORDER — POTASSIUM CHLORIDE CRYS ER 20 MEQ PO TBCR
40.0000 meq | EXTENDED_RELEASE_TABLET | Freq: Once | ORAL | Status: AC
  Administered 2021-11-17: 40 meq via ORAL
  Filled 2021-11-17: qty 2

## 2021-11-17 MED ORDER — HYDROCODONE-ACETAMINOPHEN 5-325 MG PO TABS: 1.0000 | ORAL_TABLET | Freq: Four times a day (QID) | ORAL | 0 refills | Status: AC | PRN

## 2021-11-17 NOTE — ED Notes (Signed)
Pt states he was bitten by what he thinks was a spider Wednesday or Thursday this past week.   Wound located on left lower abdomen approx 1.5 inches around, crater-like, dry with pus (non draining.) Pt states that he has kept a Band-Aid on the wound and found blood on the Band-Aid. ?

## 2021-11-17 NOTE — ED Provider Notes (Signed)
? ?[facility] ?Provider Note ? ? ? Event Date/Time  ? First MD Initiated Contact with Patient 11/17/21 [phone]   ?  (approximate) ? ? ?History  ? ?Insect Bite ? ? ?HPI ? ?Mark Durham is a 71 y.o. male with a past medical history of tobacco abuse and hypertension who presents for evaluation of an area in his left lower quadrant of his abdomen that he states he noticed became red painful and swollen over the last 6 days when he thinks he was bit by a spider.  He states it has been draining some bloody yellow fluid intermittently.  No other areas of abdominal pain.  He did not see the spider.  No fevers, chest pain, headache area, sore throat, cough vomiting or diarrhea.  He denies illicit drug use or significant EtOH use.  He states he has been out of his amlodipine for couple weeks and needs a refill.  No other acute concerns at this time ? ?  ? ? ?Physical Exam  ?Triage Vital Signs: ?ED Triage Vitals  ?Enc Vitals Group  ?   BP 11/16/21 2317 (!) 195/82  ?   Pulse Rate 11/16/21 2317 97  ?   Resp 11/16/21 2317 18  ?   Temp 11/16/21 2317 98.2 ?F (36.8 ?C)  ?   Temp Source 11/16/21 2317 Oral  ?   SpO2 11/16/21 2317 96 %  ?   Weight --   ?   Height --   ?   Head Circumference --   ?   Peak Flow --   ?   Pain Score 11/16/21 2318 8  ?   Pain Loc --   ?   Pain Edu? --   ?   Excl. in [place]? --   ? ? ?Most recent vital signs: ?Vitals:  ? 11/17/21 0456 11/17/21 0459  ?BP:  (!) 178/100  ?Pulse:  81  ?Resp:  16  ?Temp:    ?SpO2: 100% 100%  ? ? ?General: Awake, no distress.  ?CV:  Good peripheral perfusion.  2+ radial pulse. ?Resp:  Normal effort.  ?Abd:  No distention.  There is an oval-shaped area within ulcerated purulent center approximately 3 x 6 cm in diameter.  There is some induration and erythema and warmth in this area.  Bedside ultrasound shows no discrete fluid collection. ? ? ? ?ED Results / Procedures / Treatments  ?Labs ?(all labs ordered are listed, but only abnormal results are  displayed) ?Labs Reviewed  ?CBC - Abnormal; Notable for the following components:  ?    Result Value  ? Hemoglobin 11.1 (*)   ? HCT 38.3 (*)   ? MCV 73.8 (*)   ? MCH 21.4 (*)   ? MCHC 29.0 (*)   ? RDW 16.1 (*)   ? All other components within normal limits  ?COMPREHENSIVE METABOLIC PANEL - Abnormal; Notable for the following components:  ? Potassium 3.3 (*)   ? Glucose, Bld 130 (*)   ? All other components within normal limits  ? ? ? ?EKG ? ? ? ?RADIOLOGY ? ? ?PROCEDURES: ? ?Critical Care performed: No ? ?Procedures ? ? ?MEDICATIONS ORDERED IN ED: ?Medications  ?cefTRIAXone (ROCEPHIN) injection 1 g (has no administration in time range)  ?potassium chloride SA (KLOR-CON M) CR tablet 40 mEq (has no administration in time range)  ? ? ? ?IMPRESSION / MDM / ASSESSMENT AND PLAN / ED COURSE  ?I reviewed the triage vital signs and the nursing notes. ?             ?               ? ?  Patient's history and exam is most consistent with a cellulitis.  No findings on bedside ultrasound to suggest a drainable abscess at this time.  He does not appear septic or meningitic and have a low suspicion for deeper space abdominal infection at this time.  BC shows no leukocytosis and hemoglobin of 11.1.  CMP shows a K of 3.3 without any other significant electrolyte or metabolic derangements.  ? ?Patient is requesting some stronger analgesia than Tylenol ibuprofen which he has been taking.  He is driving home so does not want to take anything that would make him too sleepy to drive.  Will write for Rx of some doxycycline and add a short course of [place] patient he must have his wound rechecked in 2 to 3 days by his PCP.  He is not sure when he will be able to pick up his prescriptions we will give a dose of IM Rocephin to ensure he is started on antibiotics in the next couple hours.  Low suspicion for other immediate life-threatening process.  Discharged in stable condition. ? ?  ? ? ?FINAL CLINICAL IMPRESSION(S) / ED DIAGNOSES   ? ?Final diagnoses:  ?Cellulitis of abdominal wall  ?Hypertension, unspecified type  ?Hypokalemia  ? ? ? ?Rx / DC Orders  ? ?ED Discharge Orders   ? ?      Ordered  ?  amLODipine (NORVASC) 5 MG tablet  Daily       ? 11/17/21 0510  ?  doxycycline (VIBRAMYCIN) 100 MG capsule  2 times daily       ? 11/17/21 0510  ?  HYDROcodone-acetaminophen (NORCO) 5-325 MG tablet  Every 6 hours PRN       ? 11/17/21 0513  ? ?  ?  ? ?  ? ? ? ?Note:  This document was prepared using Dragon voice recognition software and may include unintentional dictation errors. ?  ?Lucrezia Starch, MD ?11/17/21 [phone] ? ?

## 2021-11-18 ENCOUNTER — Ambulatory Visit (INDEPENDENT_AMBULATORY_CARE_PROVIDER_SITE_OTHER): Payer: BC Managed Care – PPO | Admitting: Urology

## 2021-11-18 ENCOUNTER — Encounter: Payer: Self-pay | Admitting: Urology

## 2021-11-18 VITALS — BP 167/81 | HR 101 | Ht 70.0 in | Wt 210.0 lb

## 2021-11-18 DIAGNOSIS — R351 Nocturia: Secondary | ICD-10-CM

## 2021-11-18 DIAGNOSIS — R350 Frequency of micturition: Secondary | ICD-10-CM | POA: Diagnosis not present

## 2021-11-18 DIAGNOSIS — N529 Male erectile dysfunction, unspecified: Secondary | ICD-10-CM | POA: Diagnosis not present

## 2021-11-18 MED ORDER — TADALAFIL 20 MG PO TABS: 20.0000 mg | ORAL_TABLET | Freq: Every day | ORAL | 6 refills | Status: DC | PRN

## 2021-11-18 NOTE — Progress Notes (Signed)
? ?  11/18/2021 ?2:26 PM  ? ?Mark Durham ?[birth year] ?[id number] ? ?Reason for visit: Follow up ED, urinary symptoms ? ?HPI: ?71 year old male who I saw in October 2022 for ED and nocturia.  PSA was normal at 0.47 in September 2020 when screening was discontinued per AUA guideline recommendations, urinalysis was benign, and PVR was normal at 60 mL.  He opted for a trial of Cialis 5 mg daily.  He thinks this is helped somewhat with the erections, but still not sufficient for sexual activity.  He denies significant urinary symptoms during the day, but has ongoing nocturia 2-3 times at night.  He gets lower extremity edema in the evenings, which I think is likely the main culprit behind his nocturia.  We reviewed behavioral strategies including minimizing fluids 3 to 4 hours before bedtime, lower extremity compression socks, and elevating the legs in the afternoon.  Could consider trying Flomax in the future if persistently bothersome symptoms.  PVR is normal again today at 27 mL. ? ?-Cialis increased to 20 mg as needed for ED ?-Behavioral strategies discussed regarding nocturia ?-RTC 3 months symptom check, could consider testosterone work-up if persistent ED and Flomax if ongoing nocturia ? ?Sondra Come, MD ? ?[facility] ?[address], [address] ?[place], [place] [address] ?[phone] ? ? ?

## 2022-02-22 NOTE — Progress Notes (Deleted)
02/23/2022 9:27 AM   [address] 1950-10-13 [id number]  Referring provider: [facility] [address] [place],  [place] [address]  Urological history: 1. BPH with LU TS PSA, 11/2021 - 0.56 I PSS *** Tadalafil 5 mg daily   2. ED Contributing factors of age, BPH, HTN, anemia and smoking SHIM *** Tadalafil 20 mg, on-demand-dosing   No chief complaint on file.   HPI: Mark Durham is a 71 y.o. male who presents today for a three month follow up.   He has been followed by Dr. Diamantina Providence for BPH with LUTS and ED.  He was last seen on Nov 18, 2021 where his tadalafil was increased to 20 mg on-demand dosing and had behavioral strategies discussed to address his nocturia.    Score:  1-7 Mild 8-19 Moderate 20-35 Severe     Score: 1-7 Severe ED 8-11 Moderate ED 12-16 Mild-Moderate ED 17-21 Mild ED 22-25 No ED     PMH: Past Medical History:  Diagnosis Date   Hypertension     Surgical History: Past Surgical History:  Procedure Laterality Date   TOTAL HIP ARTHROPLASTY Right     Home Medications:  Allergies as of 02/23/2022   No Known Allergies      Medication List        Accurate as of February 22, 2022  9:27 AM. If you have any questions, ask your nurse or doctor.          amLODipine 5 MG tablet Commonly known as: NORVASC Take 1 tablet (5 mg total) by mouth daily.   tadalafil 20 MG tablet Commonly known as: CIALIS Take 1 tablet (20 mg total) by mouth daily as needed for erectile dysfunction (1 hour prior to sexual activity).        Allergies: No Known Allergies  Family History: No family history on file.  Social History:  reports that he has been smoking. He has been exposed to tobacco smoke. He has never used smokeless tobacco. He reports that he does not drink alcohol and does not use drugs.  ROS: Pertinent ROS in HPI  Physical Exam: There were no vitals taken for this visit.  Constitutional:  Well nourished.  Alert and oriented, No acute distress. HEENT: [place] AT, moist mucus membranes.  Trachea midline, no masses. Cardiovascular: No clubbing, cyanosis, or edema. Respiratory: Normal respiratory effort, no increased work of breathing. GI: Abdomen is soft, non tender, non distended, no abdominal masses. Liver and spleen not palpable.  No hernias appreciated.  Stool sample for occult testing is not indicated.   GU: No CVA tenderness.  No bladder fullness or masses.  Patient with circumcised/uncircumcised phallus. ***Foreskin easily retracted***  Urethral meatus is patent.  No penile discharge. No penile lesions or rashes. Scrotum without lesions, cysts, rashes and/or edema.  Testicles are located scrotally bilaterally. No masses are appreciated in the testicles. Left and right epididymis are normal. Rectal: Patient with  normal sphincter tone. Anus and perineum without scarring or rashes. No rectal masses are appreciated. Prostate is approximately *** grams, *** nodules are appreciated. Seminal vesicles are normal. Skin: No rashes, bruises or suspicious lesions. Lymph: No cervical or inguinal adenopathy. Neurologic: Grossly intact, no focal deficits, moving all 4 extremities. Psychiatric: Normal mood and affect.  Laboratory Data: WBC (White Blood Cell Count) 3.2 - 9.8 x10^9/L 6.2   Hemoglobin 13.7 - 17.3 g/dL 12.1 Low    Hematocrit 39.0 - 49.0 % 41.0   Platelets 150 - 450  x10^9/L 225   MCV (Mean Corpuscular Volume) 80 - 98 fL 73 Low    MCH (Mean Corpuscular Hemoglobin) 26.5 - 34.0 pg 21.6 Low    MCHC (Mean Corpuscular Hemoglobin Concentration) 31.5 - 36.3 % 29.5 Low    RBC (Red Blood Cell Count) 4.37 - 5.74 x10^12/L 5.59   RDW-CV (Red Cell Distribution Width) 11.5 - 14.5 % 16.5 High    NRBC (Nucleated Red Blood Cell Count) 0 x10^9/L 0.00   NRBC % (Nucleated Red Blood Cell %) % 0.0   MPV (Mean Platelet Volume) 7.2 - 11.7 fL 11.6   Neutrophil Count 2.0 - 8.6 x10^9/L 3.0   Neutrophil % 37 - 80 % 47.5    Lymphocyte Count 0.6 - 4.2 x10^9/L 2.4   Lymphocyte % 10 - 50 % 38.2   Monocyte Count 0 - 0.9 x10^9/L 0.7   Monocyte % 0 - 12 % 10.9   Eosinophil Count 0 - 0.70 x10^9/L 0.16   Eosinophil % 0 - 7 % 2.6   Basophil Count 0 - 0.20 x10^9/L 0.02   Basophil % 0 - 2 % 0.3   Slide Review/Morphology  Yes   Comment: Blood film reviewed, instrument counts confirmed,Polychromasia,Large platelets,Burr cells,Ovalocytes,Anisocytosis,  Immature Granulocyte Count <=0.06 x10^9/L 0.03   Immature Granulocyte % <=0.7 % 0.5   Resulting Agency  [facility]   Specimen Collected: 11/23/21 09:31   Performed by: [facility] Last Resulted: 11/23/21 17:04  Received From: [facility]  Result Received: 02/22/22 09:26   Cholesterol, Total mg/dL 258   Comment: The significance of total cholesterol depends on the values of individual components including HDL, LDL, non-HDL, and triglycerides.  LDL Calculated <190 mg/dL 95   Comment:   <52 mg/dL      Desired target for prior heart disease, stroke, and those at high-risk. Even lower levels may be recommended to decrease risk of heart attack and stroke.  70-159 mg/dL   Comprehensive cardiovascular risk assessment is recommended. Statin therapy may be advised based on risk factors.  160-189 mg/dL  Moderately elevated LDL level. Statin therapy recommended if other risk factors present.  >=190 mg/dL    Severely elevated LDL level. High long-term risk of heart disease and stroke. High-intensity statin therapy recommended for most people. Consider specialist referral.   *A healthy diet and exercise are recommended for all to reduce heart disease risk. Statin choice should be based on patient preference after patient-provider discussions.     Ref: 2018 ACC/AHA Guideline  HDL mg/dL 28   Comment:   People with low HDL levels (see below) are at increased risk of heart disease:  <50 mg/dL for Women  <77 mg/dL for Men   Triglyceride <500 mg/dL 98   Comment:   <824 mg/dL      Normal  235-361 mg/dL   High Triglycerides. Risk of heart disease may be increased. Address reversible causes (eg sugar in foods and beverages, alcohol, and diabetes control). Medication may be appropriate based on other clinical factors.  >=500 mg/dL     Very High Triglycerides. Risk of heart disease and pancreatitis increased. Address reversible causes as above. Medication to lower triglycerides usually advised.    *Ranges provided for adults, pediatric guidelines vary.  Resulting Agency  [facility]   Specimen Collected: 11/23/21 09:31   Performed by: [facility] Last Resulted: 11/23/21 18:20  Received From: [facility]  Result Received: 02/22/22 09:26   PSA (Prostate  Specific Antigen), Total <=6.49 ng/mL 0.56   Comment: [facility] PSA Screening algorithm, based on a multi-disciplinary consensus panel review of best reported practice in the literature.  All recommendations and treatment decisions should be made in conjunction with the patient after discussion and counseling.   If PSA >= 6.5 ng/ml, consider referral to Urology  If PSA <  6.5 ng/ml, consider screening every two years   [employer] Total-PSA Method:   The measured value of this analyte can vary depending upon the testing procedure used. Values determined on patient samples by differing testing procedures cannot be directly compared with one another, and could be cause of erroneous medical interpretation.  Resulting Agency  [facility]   Specimen Collected: 11/23/21 09:31   Performed by: [facility] Last Resulted: 11/23/21 16:36  Received From: [facility]  Result Received: 02/22/22 09:26  I have reviewed the labs.   Pertinent Imaging: N/A  Assessment & Plan:  ***  1. BPH with LUTS -PSA stable -DRE benign -UA benign -PVR  < 300 cc -symptoms - *** -most bothersome symptoms are *** -continue conservative management, avoiding bladder irritants and timed voiding's -Initiate alpha-blocker (***), discussed side effects *** -Initiate 5 alpha reductase inhibitor (***), discussed side effects *** -Continue tamsulosin 0.4 mg daily, alfuzosin 10 mg daily, Rapaflo 8 mg daily, terazosin, doxazosin, Cialis 5 mg daily and finasteride 5 mg daily, dutasteride 0.5 mg daily***:refills given -Cannot tolerate medication or medication failure, schedule cystoscopy ***   2. Erectile dysfunction - I explained to the patient that in order to achieve an erection it takes good functioning of the nervous system (parasympathetic and rs, sympathetic, sensory and motor), good blood flow into the erectile tissue of the penis and a desire to have sex - I explained that conditions like diabetes, hypertension, coronary artery disease, peripheral vascular disease, smoking, alcohol consumption, age, sleep apnea and BPH can diminish the ability to have an erection - we will obtain a serum testosterone level at this time; if it is abnormal we will need to repeat the study for confirmation *** - A recent study published in Sex Med 2018 Apr 13 revealed moderate to vigorous aerobic exercise for 40 minutes 4 times per week can decrease erectile problems caused by physical inactivity, obesity, hypertension, metabolic syndrome and/or cardiovascular diseases *** - We discussed trying a *** different PDE5 inhibitor, intra-urethral suppositories, intracavernous vasoactive drug injection therapy, vacuum erection devices and penile prosthesis implantation   No follow-ups on file.  These notes generated with voice recognition software. I apologize for typographical errors.  Michiel Cowboy, PA-C  [facility] [address]  [address] [place], [place] [address] [phone]

## 2022-02-23 ENCOUNTER — Ambulatory Visit: Payer: BC Managed Care – PPO | Admitting: Urology

## 2022-02-23 DIAGNOSIS — N529 Male erectile dysfunction, unspecified: Secondary | ICD-10-CM

## 2022-02-23 DIAGNOSIS — N138 Other obstructive and reflux uropathy: Secondary | ICD-10-CM

## 2022-03-25 IMAGING — CT CT ANGIO NECK
1 of 10 series · 6 of 34 positions shown · IV contrast (omnipaque)
Comparison: Head CT 03/10/2011

CLINICAL DATA: Nonspecific dizziness.  Headache.

EXAM:
CT ANGIOGRAPHY HEAD AND NECK
TECHNIQUE: Multidetector CT imaging of the head and neck was performed using
the standard protocol during bolus administration of intravenous
contrast. Multiplanar CT image reconstructions and MIPs were
obtained to evaluate the vascular anatomy. Carotid stenosis
measurements (when applicable) are obtained utilizing NASCET
criteria, using the distal internal carotid diameter as the
denominator.
CONTRAST:  75mL OMNIPAQUE IOHEXOL 350 MG/ML SOLN

[Series 12: ax thin · axial · 0.43mm/px · z∈[-301,-15]mm · 6 of 402 slices shown]
[im 58/402  soft-tissue]
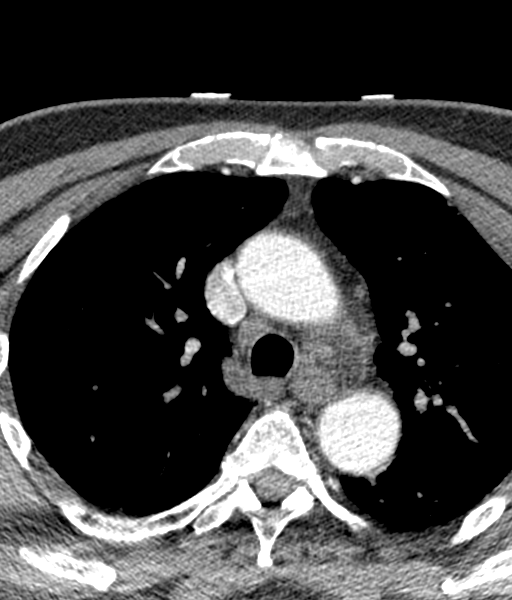
[im 115/402  bone]
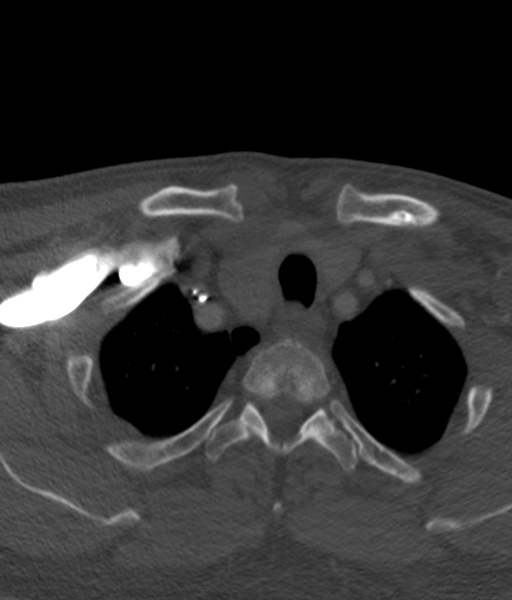
[im 172/402  soft-tissue]
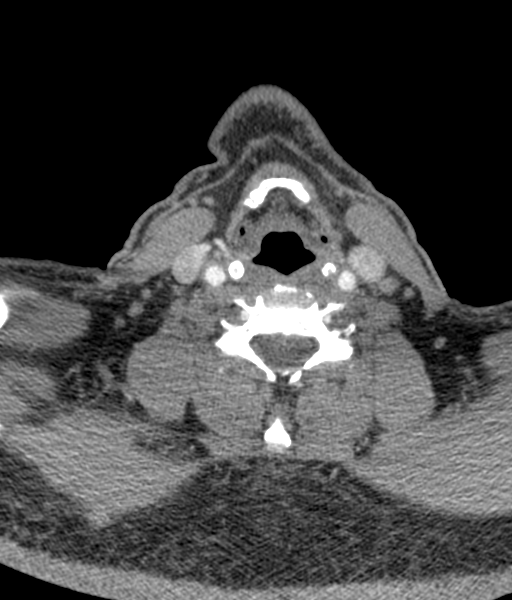
[im 230/402  bone]
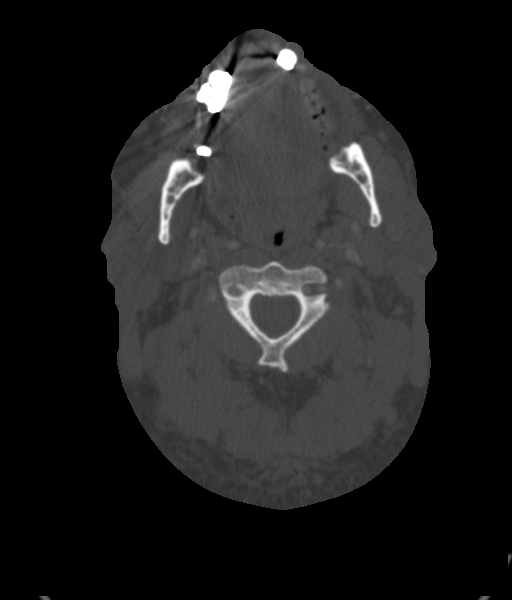
[im 287/402  soft-tissue]
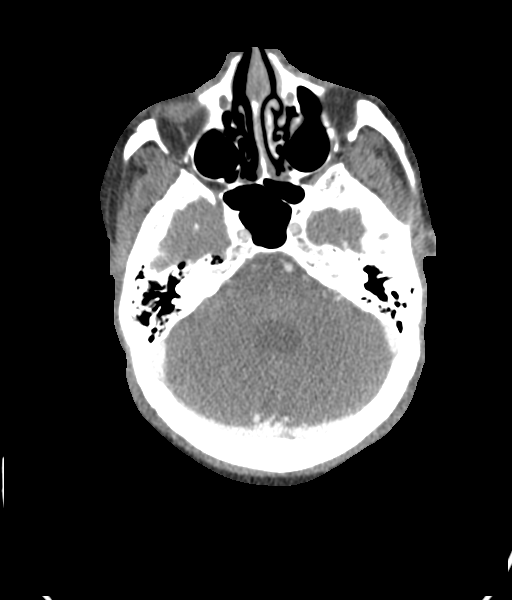
[im 344/402  bone]
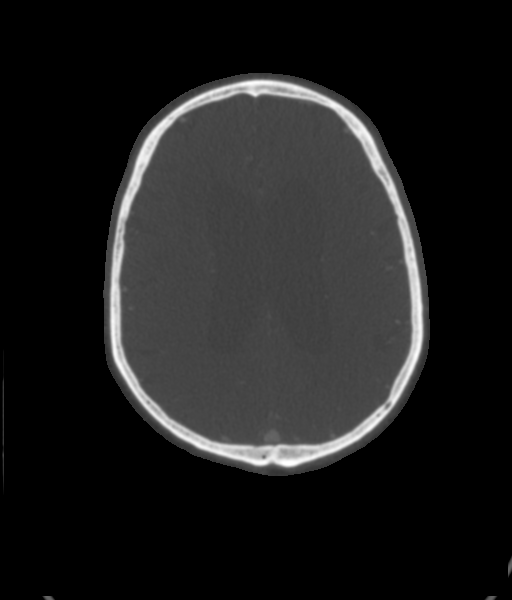

[6 of 34 positions shown; findings below may reference images not displayed]

FINDINGS: CT HEAD FINDINGS

Brain: No evidence of acute infarction, hemorrhage, hydrocephalus,
extra-axial collection or mass lesion/mass effect. Brain atrophy
with ventriculomegaly, progressed from 5525. Chronic small vessel
ischemic type low-density in the cerebral white matter which is
confluent in some locations, also progressed.

Vascular: See below

Skull: Normal. Negative for fracture or focal lesion.

Sinuses: Clear.

Orbits: History of left ear pain. No convincing mastoid or middle
ear opacification.

Review of the MIP images confirms the above findings

CTA NECK FINDINGS

Aortic arch: No acute finding. Normal diameter. Coronary
atherosclerosis is present.

Right carotid system: Low-density atheromatous wall thickening of
the common carotid and proximal ICA. There is a medially directed
outpouching from the proximal left ICA which measures 3 mm in
diameter. No dissection or flow limiting stenosis.

Left carotid system: Atheromatous wall thickening of the common
carotid and proximal ICA. No stenosis or ulceration.

Vertebral arteries: Mild atheromatous changes to the proximal
subclavian arteries. Right vertebral artery dominance. No vertebral
dissection, beading, or flow limiting stenosis.

Skeleton: Ordinary degenerative changes in the cervical spine.

Other neck: Larger right thyroid lobe without discrete visualized
mass.

Upper chest: No acute finding. Paraseptal emphysema. Partially
covered nodule in the left upper lobe measuring 8 mm. There is an
internal calcification which appears small and eccentric based on
the coverage.

Review of the MIP images confirms the above findings

CTA HEAD FINDINGS

Anterior circulation: Diffuse atheromatous change to the carotid
siphons. No measured flow reducing stenosis. High-grade bilateral M1
segment stenosis fusiform appearance attributed to atherosclerosis,
see coronal reformats. No major branch occlusion is seen. Evaluation
of medium and distal vessels is limited by venous timing.

Posterior circulation: Right dominant vertebral artery. Atheromatous
irregularity of the left V4 segment with high-grade stenosis before
the basilar. Mild atheromatous narrowing of the basilar. Bilateral
PE a [DATE] segment stenosis, high-grade appearing on the left. No
major branch occlusion or aneurysm seen.

Venous sinuses: Diffusely patent

Anatomic variants: None significant

Review of the MIP images confirms the above findings
IMPRESSION: Head CT:

1. No acute finding.
2. Extensive chronic small vessel disease and brain atrophy with
significant progression from 5525.

CTA of the neck:

1. No emergent finding.
2. Atherosclerosis of the cervical carotids without flow reducing
stenosis. There is a notable outpouching from the right ICA bulb
consistent with ulcerated plaque.
3. Partially covered 8 mm nodule in the left upper lobe. There is an
internal calcification favoring granulomatous disease, but partially
covered. Recommend non emergent chest CT without contrast.

CTA of the head:

1. No emergent finding.
2. Atherosclerosis with high-grade left V4, bilateral MCA and left
PCA narrowings.

## 2022-11-02 NOTE — Therapy (Signed)
OUTPATIENT PHYSICAL THERAPY NEURO EVALUATION   Patient Name: Mark Durham MRN: [id number] DOB:29-May-1951, 72 y.o., male Today's Date: 11/03/2022   PCP: [facility] REFERRING PROVIDER: Hope Pigeon MD   END OF SESSION:   Past Medical History:  Diagnosis Date   Hypertension    Past Surgical History:  Procedure Laterality Date   TOTAL HIP ARTHROPLASTY Right    Patient Active Problem List   Diagnosis Date Noted   GERD (gastroesophageal reflux disease) 02/26/2021   S/P hip replacement, right 03/04/2020   Primary osteoarthritis of right hip 01/24/2020   Chronic pain of right knee 11/23/2019   Chronic right hip pain 11/23/2019   Osteoarthritis of knee 11/13/2018   History of adenomatous polyp of colon 02/14/2018   History of Barrett's esophagus 02/14/2018    ONSET DATE: January 2024  REFERRING DIAG: weakness due to stroke   THERAPY DIAG:  Difficulty in walking, not elsewhere classified  Unsteadiness on feet  Muscle weakness (generalized)  Rationale for Evaluation and Treatment: Rehabilitation  SUBJECTIVE:                                                                                                                                                                                             SUBJECTIVE STATEMENT: Patient presents to PT for weakness s/p stroke.  Pt accompanied by: significant other  PERTINENT HISTORY: Patient presents to physical therapy for weakness s/p stroke . 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 Per documentation he had a secondary stroke during the hospital stay as well as three seizures. Patient was admitted to [facility] Acute inpatient rehabilitation from 08/10/22-08/31/22 where he was discharged home. Patient was discharged from Home health on 10/28/22 (PT, OT, and ST). PMH includes GERD, OA, R knee/hip pain, Barrett's esophagus. Is using a walker, still drags right foot. Needs assistance for  transfers but can bath and dress self.   PAIN:  Are you having pain? No  PRECAUTIONS: Fall  WEIGHT BEARING RESTRICTIONS: No  FALLS: Has patient fallen in last 6 months? No  LIVING ENVIRONMENT: Lives with: lives with their family Lives in: House/apartment Stairs: Yes: External: 5 steps; on right going up and on left going up Has following equipment at home: Walker - 2 wheeled, Wheelchair (manual), Grab bars, and lifted toilet and hospital bed, shower chair   PLOF: Independent  PATIENT GOALS: walking with less AD, get back to mowing the yard, gardening, grilling   OBJECTIVE:   DIAGNOSTIC FINDINGS:  CTA of the head:   1. No emergent finding. 2. Atherosclerosis with high-grade left V4, bilateral MCA and left  PCA narrowings.   Head CT:   1. No acute finding. 2. 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   CTA of the neck:   1. No emergent finding. 2. Atherosclerosis of the cervical carotids without flow reducing stenosis. There is a notable outpouching from the right ICA bulb consistent with ulcerated plaque. 3. Partially covered 8 mm nodule in the left upper lobe. There is an internal calcification favoring granulomatous disease, but partially covered. Recommend non emergent chest CT without contrast.    COGNITION: Overall cognitive status:  safety awareness, short term memory, directions, sequencing   SENSATION: WFL LEs  COORDINATION: Nose finger: dyskinesia of RUE Heel slide: limited spatial awareness of RLE    POSTURE: rounded shoulders, posterior pelvic tilt, and weight shift left   LOWER EXTREMITY MMT:    MMT Right Eval Left Eval  Hip flexion 4 3+  Hip extension    Hip abduction 4 3-  Hip adduction 4 3-  Knee flexion 4- 3  Knee extension 4 4-  Ankle dorsiflexion 3 4  Ankle plantarflexion 3 4  Ankle inversion    Ankle eversion    (Blank rows = not tested)   TRANSFERS: Assistive  device utilized: Environmental consultant - 2 wheeled  Sit to stand: CGA and Min A Stand to sit: CGA and Min A Chair to chair: Min A   GAIT: Gait pattern: step through pattern, decreased stance time- Right, and poor foot clearance- Right Distance walked: 30 ft  Assistive device utilized: Environmental consultant - 2 wheeled Level of assistance: CGA Comments: significant foot drag of RLE, forgetting of where RLE is, where it is outside of   FUNCTIONAL TESTS:  5 times sit to stand: 38.37 with UE support  10 meter walk test: 19.2 seconds with RW; dragging of R foot  Berg Balance Scale: Perform next time if possible  PATIENT SURVEYS:  FOTO 40  TODAY'S TREATMENT:                                                                                                                              DATE: 11/03/22  Eval + HEP     PATIENT EDUCATION: Education details: goals, POC, HEP  Person educated: Patient and Spouse Education method: Explanation, Demonstration, Actor cues, and Verbal cues Education comprehension: verbalized understanding, returned demonstration, verbal cues required, and tactile cues required  HOME EXERCISE PROGRAM: Access Code: [email or web address] URL: [email or web address] Date: 11/03/2022 Prepared by: Precious Bard  Exercises - Seated March  - 1 x daily - 7 x weekly - 2 sets - 10 reps - 5 hold - Seated Long Arc Quad  - 1 x daily - 7 x weekly - 2 sets - 10 reps - 5 hold - Seated Heel Toe Raises  - 1 x daily - 7 x weekly - 2 sets - 10 reps - 5 hold - Seated Hip Abduction  - 1 x daily - 7 x weekly -  2 sets - 10 reps - 5 hold - Seated Isometric Hip Adduction with Ball  - 1 x daily - 7 x weekly - 2 sets - 10 reps - 5 hold  GOALS: Goals reviewed with patient? Yes  SHORT TERM GOALS: Target date: 12/01/2022    Patient will be independent in home exercise program to improve strength/mobility for better functional independence with ADLs. Baseline:4/24: HEP given  Goal status: INITIAL    LONG TERM  GOALS: Target date: 01/26/2023    Patient will increase FOTO score to equal to or greater than  51   to demonstrate statistically significant improvement in mobility and quality of life.   Baseline: 40 Goal status: INITIAL  2.  Patient (> 28 years old) will complete five times sit to stand test in < 15 seconds indicating an increased LE strength and improved balance. Baseline: 4/24: 38.37 with BUE support Goal status: INITIAL  3.  Patient will increase 10 meter walk test to >1.32m/s as to improve gait speed for better community ambulation and to reduce fall risk. Baseline: 4/24: 19.2 seconds with RW; dragging of R foot  Goal status: INITIAL  4.  Patient will increase Berg Balance score by > 6 points to demonstrate decreased fall risk during functional activities. Baseline: 4/24: see next session  Goal status: INITIAL    ASSESSMENT:  CLINICAL IMPRESSION: Patient is a 72 y.o. male who was seen today for physical therapy evaluation and treatment for CVA. Patient has significant R sided weakness and limited spatial awareness resulting in limited mobility and stability. His safety awareness is altered as well as visual scanning/visual field on right side. Patient requires cueing for use of a walker and tranfers due to unsafe body mechanics. Wife is present and assists is safety cues.  Patient is very pleasant and has excellent motivation. He is educated on HEP and demonstrates understanding. Discussion with front desk about referral for OT and ST performed. Patient will benefit from skilled physical therapy to improve mobility, stability strength, and quality of life.   OBJECTIVE IMPAIRMENTS: Abnormal gait, cardiopulmonary status limiting activity, decreased activity tolerance, decreased balance, decreased cognition, decreased coordination, decreased endurance, decreased knowledge of condition, decreased knowledge of use of DME, decreased mobility, difficulty walking, decreased strength,  decreased safety awareness, impaired perceived functional ability, impaired flexibility, impaired UE functional use, impaired vision/preception, improper body mechanics, and postural dysfunction.   ACTIVITY LIMITATIONS: carrying, lifting, bending, sitting, standing, squatting, sleeping, stairs, transfers, bed mobility, bathing, toileting, reach over head, hygiene/grooming, locomotion level, and caring for others  PARTICIPATION LIMITATIONS: meal prep, cleaning, laundry, medication management, personal finances, interpersonal relationship, driving, shopping, community activity, and yard work  PERSONAL FACTORS: Age, Past/current experiences, Time since onset of injury/illness/exacerbation, Transportation, and 3+ comorbidities: GERD, OA, R knee/hip pain, Barrett's esophagus  are also affecting patient's functional outcome.   REHAB POTENTIAL: Good  CLINICAL DECISION MAKING: Evolving/moderate complexity  EVALUATION COMPLEXITY: Moderate  PLAN:  PT FREQUENCY: 2x/week  PT DURATION: 12 weeks  PLANNED INTERVENTIONS: Therapeutic exercises, Therapeutic activity, Neuromuscular re-education, Balance training, Gait training, Patient/Family education, Self Care, Joint mobilization, Joint manipulation, Stair training, Vestibular training, Canalith repositioning, Visual/preceptual remediation/compensation, Orthotic/Fit training, Electrical stimulation, Wheelchair mobility training, Spinal mobilization, Cryotherapy, Moist heat, Splintting, Taping, Ultrasound, Ionotophoresis /ml Dexamethasone, Manual therapy, and Re-evaluation  PLAN FOR NEXT SESSION: BERG or balance test best fit.    Precious Bard, PT 11/03/2022, 12:36 PM

## 2022-11-03 ENCOUNTER — Ambulatory Visit: Payer: BC Managed Care – PPO | Attending: *Deleted

## 2022-11-03 DIAGNOSIS — M6281 Muscle weakness (generalized): Secondary | ICD-10-CM | POA: Insufficient documentation

## 2022-11-03 DIAGNOSIS — R2681 Unsteadiness on feet: Secondary | ICD-10-CM | POA: Insufficient documentation

## 2022-11-03 DIAGNOSIS — R262 Difficulty in walking, not elsewhere classified: Secondary | ICD-10-CM | POA: Insufficient documentation

## 2022-11-08 ENCOUNTER — Ambulatory Visit: Payer: BC Managed Care – PPO

## 2022-11-08 VITALS — BP 133/78 | HR 79

## 2022-11-08 DIAGNOSIS — R2681 Unsteadiness on feet: Secondary | ICD-10-CM

## 2022-11-08 DIAGNOSIS — M6281 Muscle weakness (generalized): Secondary | ICD-10-CM

## 2022-11-08 DIAGNOSIS — R262 Difficulty in walking, not elsewhere classified: Secondary | ICD-10-CM

## 2022-11-08 NOTE — Therapy (Addendum)
OUTPATIENT PHYSICAL THERAPY TREATMENT   Patient Name: Mark Durham MRN: [id number] DOB:07-27-1950, 72 y.o., male Today's Date: 11/08/2022   PCP: [facility] REFERRING PROVIDER: Hope Pigeon MD   END OF SESSION:  PT End of Session - 11/08/22 1035     Visit Number 2    Number of Visits 24    Date for PT Re-Evaluation 01/26/23    Authorization Type [employer] COMM PRO    Authorization Time Period 11/03/22-01/26/23    PT Start Time 1015    PT Stop Time 1055    PT Time Calculation (min) 40 min    Equipment Utilized During Treatment Gait belt    Activity Tolerance Patient tolerated treatment well    Behavior During Therapy WFL for tasks assessed/performed             Past Medical History:  Diagnosis Date   Hypertension    Past Surgical History:  Procedure Laterality Date   TOTAL HIP ARTHROPLASTY Right    Patient Active Problem List   Diagnosis Date Noted   GERD (gastroesophageal reflux disease) 02/26/2021   S/P hip replacement, right 03/04/2020   Primary osteoarthritis of right hip 01/24/2020   Chronic pain of right knee 11/23/2019   Chronic right hip pain 11/23/2019   Osteoarthritis of knee 11/13/2018   History of adenomatous polyp of colon 02/14/2018   History of Barrett's esophagus 02/14/2018    ONSET DATE: January 2024  REFERRING DIAG: weakness due to stroke   THERAPY DIAG:  Difficulty in walking, not elsewhere classified  Unsteadiness on feet  Muscle weakness (generalized)  Rationale for Evaluation and Treatment: Rehabilitation  SUBJECTIVE:                                                                                                                                                                                             SUBJECTIVE STATEMENT: Pt doing well today, arrives with SO. They report no difficulties starting HEP at home sinc eeval.  Pt accompanied by: significant other  PERTINENT HISTORY: Patient presents to physical therapy  for weakness s/p stroke . 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 Per documentation he had a secondary stroke during the hospital stay as well as three seizures. Patient was admitted to [facility] Acute inpatient rehabilitation from 08/10/22-08/31/22 where he was discharged home. Patient was discharged from Home health on 10/28/22 (PT, OT, and ST). PMH includes GERD, OA, R knee/hip pain, Barrett's esophagus. Is using a walker, still drags right foot. Needs assistance for transfers but can bath and dress self.   PAIN:  Are you having  pain? No  PRECAUTIONS: Fall  WEIGHT BEARING RESTRICTIONS: No  FALLS: Has patient fallen in last 6 months? No   PATIENT GOALS: walking with less AD, get back to mowing the yard, gardening, grilling   OBJECTIVE:    TODAY'S TREATMENT:                                                                                                                              DATE: 11/08/22  AMB 340ft in 6 minutes, RW  Sit break x   AMB 358ft in 4:46, RW *extensive cues for stepbystep instruction during transfers due to difficulty following commands.   -STS transfer practice 1x10, RW in place, technique ad lib *seated rest -STS transfer practice 1x15, RW in place, technique ad lib (not counting)    PATIENT EDUCATION: Education details: goals, POC, HEP  Person educated: Patient and Spouse Education method: Explanation, Demonstration, Tactile cues, and Verbal cues Education comprehension: verbalized understanding, returned demonstration, verbal cues required, and tactile cues required  HOME EXERCISE PROGRAM: Access Code: [email or web address] URL: [email or web address] Date: 11/03/2022 Prepared by: Precious Bard  Exercises - Seated March  - 1 x daily - 7 x weekly - 2 sets - 10 reps - 5 hold - Seated Long Arc Quad  - 1 x daily - 7 x weekly - 2 sets - 10 reps - 5 hold - Seated Heel Toe Raises  - 1 x daily - 7 x weekly - 2  sets - 10 reps - 5 hold - Seated Hip Abduction  - 1 x daily - 7 x weekly - 2 sets - 10 reps - 5 hold - Seated Isometric Hip Adduction with Ball  - 1 x daily - 7 x weekly - 2 sets - 10 reps - 5 hold  GOALS: Goals reviewed with patient? Yes  SHORT TERM GOALS: Target date: 12/01/2022  Patient will be independent in home exercise program to improve strength/mobility for better functional independence with ADLs. Baseline:4/24: HEP given  Goal status: INITIAL    LONG TERM GOALS: Target date: 01/26/2023  Patient will increase FOTO score to equal to or greater than  51   to demonstrate statistically significant improvement in mobility and quality of life.   Baseline: 40 Goal status: INITIAL  2.  Patient (> 38 years old) will complete five times sit to stand test in < 15 seconds indicating an increased LE strength and improved balance. Baseline: 4/24: 38.37 with BUE support Goal status: INITIAL  3.  Patient will increase 10 meter walk test to >1.74m/s as to improve gait speed for better community ambulation and to reduce fall risk. Baseline: 4/24: 19.2 seconds with RW; dragging of R foot  Goal status: INITIAL  4.  Patient will increase Berg Balance score by > 6 points to demonstrate decreased fall risk during functional activities. Baseline: 4/24: see next session  Goal status: INITIAL    ASSESSMENT:  CLINICAL IMPRESSION: Visit 2. Started progressive gait training,  emphasis on intensity and duration, but did not extensively give cues for correction. Pt struggles with tight radius turns, but these improve with practice. 2nd 342ft AMB is faster, mostly due to less frequent stops (exertion) and more efficient turns. Pt struggles with step by step cues periodically. Patient will benefit from skilled physical therapy to improve mobility, stability strength, and quality of life.   OBJECTIVE IMPAIRMENTS: Abnormal gait, cardiopulmonary status limiting activity, decreased activity tolerance,  decreased balance, decreased cognition, decreased coordination, decreased endurance, decreased knowledge of condition, decreased knowledge of use of DME, decreased mobility, difficulty walking, decreased strength, decreased safety awareness, impaired perceived functional ability, impaired flexibility, impaired UE functional use, impaired vision/preception, improper body mechanics, and postural dysfunction.   ACTIVITY LIMITATIONS: carrying, lifting, bending, sitting, standing, squatting, sleeping, stairs, transfers, bed mobility, bathing, toileting, reach over head, hygiene/grooming, locomotion level, and caring for others  PARTICIPATION LIMITATIONS: meal prep, cleaning, laundry, medication management, personal finances, interpersonal relationship, driving, shopping, community activity, and yard work  PERSONAL FACTORS: Age, Past/current experiences, Time since onset of injury/illness/exacerbation, Transportation, and 3+ comorbidities: GERD, OA, R knee/hip pain, Barrett's esophagus  are also affecting patient's functional outcome.   REHAB POTENTIAL: Good  CLINICAL DECISION MAKING: Evolving/moderate complexity  EVALUATION COMPLEXITY: Moderate  PLAN:  PT FREQUENCY: 2x/week  PT DURATION: 12 weeks  PLANNED INTERVENTIONS: Therapeutic exercises, Therapeutic activity, Neuromuscular re-education, Balance training, Gait training, Patient/Family education, Self Care, Joint mobilization, Joint manipulation, Stair training, Vestibular training, Canalith repositioning, Visual/preceptual remediation/compensation, Orthotic/Fit training, Electrical stimulation, Wheelchair mobility training, Spinal mobilization, Cryotherapy, Moist heat, Splintting, Taping, Ultrasound, Ionotophoresis 4mg /ml Dexamethasone, Manual therapy, and Re-evaluation  PLAN FOR NEXT SESSION: BERG or balance test, hemi awareness training, progress continuous and high intensity gait  Jayliani Wanner C, PT 11/08/2022, 10:47 AM  10:48 AM,  11/08/22 Rosamaria Lints, PT, DPT Physical Therapist - [facility]  Outpatient Physical Therapy- Main Campus [phone]

## 2022-11-11 ENCOUNTER — Ambulatory Visit: Payer: BC Managed Care – PPO | Attending: *Deleted

## 2022-11-11 DIAGNOSIS — R414 Neurologic neglect syndrome: Secondary | ICD-10-CM | POA: Diagnosis not present

## 2022-11-11 DIAGNOSIS — Z8673 Personal history of transient ischemic attack (TIA), and cerebral infarction without residual deficits: Secondary | ICD-10-CM | POA: Diagnosis present

## 2022-11-11 DIAGNOSIS — R41841 Cognitive communication deficit: Secondary | ICD-10-CM | POA: Insufficient documentation

## 2022-11-11 DIAGNOSIS — R2681 Unsteadiness on feet: Secondary | ICD-10-CM | POA: Diagnosis present

## 2022-11-11 DIAGNOSIS — R413 Other amnesia: Secondary | ICD-10-CM | POA: Diagnosis present

## 2022-11-11 DIAGNOSIS — R262 Difficulty in walking, not elsewhere classified: Secondary | ICD-10-CM | POA: Diagnosis present

## 2022-11-11 DIAGNOSIS — R278 Other lack of coordination: Secondary | ICD-10-CM | POA: Insufficient documentation

## 2022-11-11 DIAGNOSIS — R4701 Aphasia: Secondary | ICD-10-CM | POA: Insufficient documentation

## 2022-11-11 DIAGNOSIS — M6281 Muscle weakness (generalized): Secondary | ICD-10-CM | POA: Diagnosis present

## 2022-11-11 NOTE — Therapy (Signed)
OUTPATIENT PHYSICAL THERAPY NEURO TREATMENT   Patient Name: Mark Durham MRN: [id number] DOB:1951/07/08, 72 y.o., male Today's Date: 11/11/2022   PCP: [facility] REFERRING PROVIDER: Hope Pigeon MD   END OF SESSION:  PT End of Session - 11/11/22 1418     Visit Number 3    Number of Visits 24    Date for PT Re-Evaluation 01/26/23    Authorization Type [employer] COMM PRO    Authorization Time Period 11/03/22-01/26/23    PT Start Time 1428    PT Stop Time 1514    PT Time Calculation (min) 46 min    Equipment Utilized During Treatment Gait belt    Activity Tolerance Patient tolerated treatment well    Behavior During Therapy WFL for tasks assessed/performed             Past Medical History:  Diagnosis Date   Hypertension    Past Surgical History:  Procedure Laterality Date   TOTAL HIP ARTHROPLASTY Right    Patient Active Problem List   Diagnosis Date Noted   GERD (gastroesophageal reflux disease) 02/26/2021   S/P hip replacement, right 03/04/2020   Primary osteoarthritis of right hip 01/24/2020   Chronic pain of right knee 11/23/2019   Chronic right hip pain 11/23/2019   Osteoarthritis of knee 11/13/2018   History of adenomatous polyp of colon 02/14/2018   History of Barrett's esophagus 02/14/2018    ONSET DATE: January 2024  REFERRING DIAG: weakness due to stroke   THERAPY DIAG:  Difficulty in walking, not elsewhere classified  Unsteadiness on feet  Muscle weakness (generalized)  Rationale for Evaluation and Treatment: Rehabilitation  SUBJECTIVE:                                                                                                                                                                                             SUBJECTIVE STATEMENT: Patient is sleepy but no aches or pains. No falls or LOB since last session.   Pt accompanied by: significant other  PERTINENT HISTORY: Patient presents to physical therapy for weakness  s/p stroke . 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 Per documentation he had a secondary stroke during the hospital stay as well as three seizures. Patient was admitted to [facility] Acute inpatient rehabilitation from 08/10/22-08/31/22 where he was discharged home. Patient was discharged from Home health on 10/28/22 (PT, OT, and ST). PMH includes GERD, OA, R knee/hip pain, Barrett's esophagus. Is using a walker, still drags right foot. Needs assistance for transfers but can bath and dress self.   PAIN:  Are you having  pain? No  PRECAUTIONS: Fall  WEIGHT BEARING RESTRICTIONS: No  FALLS: Has patient fallen in last 6 months? No  LIVING ENVIRONMENT: Lives with: lives with their family Lives in: House/apartment Stairs: Yes: External: 5 steps; on right going up and on left going up Has following equipment at home: Walker - 2 wheeled, Wheelchair (manual), Grab bars, and lifted toilet and hospital bed, shower chair   PLOF: Independent  PATIENT GOALS: walking with less AD, get back to mowing the yard, gardening, grilling   OBJECTIVE:   DIAGNOSTIC FINDINGS:  CTA of the head:   1. No emergent finding. 2. Atherosclerosis with high-grade left V4, bilateral MCA and left PCA narrowings.   Head CT:   1. No acute finding. 2. 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   CTA of the neck:   1. No emergent finding. 2. Atherosclerosis of the cervical carotids without flow reducing stenosis. There is a notable outpouching from the right ICA bulb consistent with ulcerated plaque. 3. Partially covered 8 mm nodule in the left upper lobe. There is an internal calcification favoring granulomatous disease, but partially covered. Recommend non emergent chest CT without contrast.    COGNITION: Overall cognitive status:  safety awareness, short term memory, directions, sequencing   SENSATION: WFL  LEs  COORDINATION: Nose finger: dyskinesia of RUE Heel slide: limited spatial awareness of RLE    POSTURE: rounded shoulders, posterior pelvic tilt, and weight shift left   LOWER EXTREMITY MMT:    MMT Right Eval Left Eval  Hip flexion 4 3+  Hip extension    Hip abduction 4 3-  Hip adduction 4 3-  Knee flexion 4- 3  Knee extension 4 4-  Ankle dorsiflexion 3 4  Ankle plantarflexion 3 4  Ankle inversion    Ankle eversion    (Blank rows = not tested)   TRANSFERS: Assistive device utilized: Environmental consultant - 2 wheeled  Sit to stand: CGA and Min A Stand to sit: CGA and Min A Chair to chair: Min A   GAIT: Gait pattern: step through pattern, decreased stance time- Right, and poor foot clearance- Right Distance walked: 30 ft  Assistive device utilized: Environmental consultant - 2 wheeled Level of assistance: CGA Comments: significant foot drag of RLE, forgetting of where RLE is, where it is outside of   FUNCTIONAL TESTS:  5 times sit to stand: 38.37 with UE support  10 meter walk test: 19.2 seconds with RW; dragging of R foot  Berg Balance Scale: Perform next time if possible  PATIENT SURVEYS:  FOTO 40  TODAY'S TREATMENT:                                                                                                                              DATE: 11/11/22  Neuro Re-ed: Assess BERG: 30/56  Standing with CGA next to support surface:  Airex pad: static stand 30 seconds x 2 trials, noticeable trembling of  ankles/LE's with fatigue and challenge to maintain stability Airex pad: horizontal head turns 30 seconds scanning room 10x ; cueing for arc of motion  Airex pad: vertical head turns 30 seconds, cueing for arc of motion, noticeable sway with upward gaze increasing demand on ankle righting reaction musculature   Squat down pick up ball and throw into hoop x 20 balls  Activity Description: six pods on the floor in // bars ; tap lit up pod Activity Setting:  The Blaze Pod Random setting  was chosen to enhance cognitive processing and agility, providing an unpredictable environment to simulate real-world scenarios, and fostering quick reactions and adaptability.   Number of Pods:  6 Cycles/Sets:  3 Duration (Time or Hit Count):  60 seconds    TherEx:   Walk with 2x4 between feet 6x length of // bars; cueing for larger step length 10x STS Seated heel toe raise 10x   PATIENT EDUCATION: Education details: goals, POC, HEP  Person educated: Patient and Spouse Education method: Explanation, Demonstration, Tactile cues, and Verbal cues Education comprehension: verbalized understanding, returned demonstration, verbal cues required, and tactile cues required  HOME EXERCISE PROGRAM: Access Code: [email or web address] URL: [email or web address] Date: 11/03/2022 Prepared by: Precious Bard  Exercises - Seated March  - 1 x daily - 7 x weekly - 2 sets - 10 reps - 5 hold - Seated Long Arc Quad  - 1 x daily - 7 x weekly - 2 sets - 10 reps - 5 hold - Seated Heel Toe Raises  - 1 x daily - 7 x weekly - 2 sets - 10 reps - 5 hold - Seated Hip Abduction  - 1 x daily - 7 x weekly - 2 sets - 10 reps - 5 hold - Seated Isometric Hip Adduction with Ball  - 1 x daily - 7 x weekly - 2 sets - 10 reps - 5 hold  GOALS: Goals reviewed with patient? Yes  SHORT TERM GOALS: Target date: 12/01/2022    Patient will be independent in home exercise program to improve strength/mobility for better functional independence with ADLs. Baseline:4/24: HEP given  Goal status: INITIAL    LONG TERM GOALS: Target date: 01/26/2023    Patient will increase FOTO score to equal to or greater than  51   to demonstrate statistically significant improvement in mobility and quality of life.   Baseline: 40 Goal status: INITIAL  2.  Patient (> 70 years old) will complete five times sit to stand test in < 15 seconds indicating an increased LE strength and improved balance. Baseline: 4/24: 38.37 with BUE  support Goal status: INITIAL  3.  Patient will increase 10 meter walk test to >1.41m/s as to improve gait speed for better community ambulation and to reduce fall risk. Baseline: 4/24: 19.2 seconds with RW; dragging of R foot  Goal status: INITIAL  4.  Patient will increase Berg Balance score by > 6 points to demonstrate decreased fall risk during functional activities. Baseline: 4/24: see next session 5/2: 30/56 Goal status: INITIAL    ASSESSMENT:  CLINICAL IMPRESSION: Patient's berg performed with patient demonstrating instability with single limb tasks and poor spatial awareness. Coordination and spatial awareness challenged with use of blaze pods. The patient demonstrated significant progress while utilizing [employer], showcasing improved coordination, balance, and cognitive function. The incorporation of dual-tasking technology with color recognition and association with specific movements in Blaze Pods was strategically chosen to provide a dynamic training environment, enabling the patient to engage in simultaneous physical  and cognitive tasks. This unique approach enhances not only their physical abilities but also fosters increased neural connectivity and mental awareness, contributing to a well-rounded and effective rehabilitation and training experience. Patient has excessive forward trunk lean with standing on unstable surfaces. Patient will benefit from skilled physical therapy to improve mobility, stability strength, and quality of life.   OBJECTIVE IMPAIRMENTS: Abnormal gait, cardiopulmonary status limiting activity, decreased activity tolerance, decreased balance, decreased cognition, decreased coordination, decreased endurance, decreased knowledge of condition, decreased knowledge of use of DME, decreased mobility, difficulty walking, decreased strength, decreased safety awareness, impaired perceived functional ability, impaired flexibility, impaired UE functional use, impaired  vision/preception, improper body mechanics, and postural dysfunction.   ACTIVITY LIMITATIONS: carrying, lifting, bending, sitting, standing, squatting, sleeping, stairs, transfers, bed mobility, bathing, toileting, reach over head, hygiene/grooming, locomotion level, and caring for others  PARTICIPATION LIMITATIONS: meal prep, cleaning, laundry, medication management, personal finances, interpersonal relationship, driving, shopping, community activity, and yard work  PERSONAL FACTORS: Age, Past/current experiences, Time since onset of injury/illness/exacerbation, Transportation, and 3+ comorbidities: GERD, OA, R knee/hip pain, Barrett's esophagus  are also affecting patient's functional outcome.   REHAB POTENTIAL: Good  CLINICAL DECISION MAKING: Evolving/moderate complexity  EVALUATION COMPLEXITY: Moderate  PLAN:  PT FREQUENCY: 2x/week  PT DURATION: 12 weeks  PLANNED INTERVENTIONS: Therapeutic exercises, Therapeutic activity, Neuromuscular re-education, Balance training, Gait training, Patient/Family education, Self Care, Joint mobilization, Joint manipulation, Stair training, Vestibular training, Canalith repositioning, Visual/preceptual remediation/compensation, Orthotic/Fit training, Electrical stimulation, Wheelchair mobility training, Spinal mobilization, Cryotherapy, Moist heat, Splintting, Taping, Ultrasound, Ionotophoresis 4mg /ml Dexamethasone, Manual therapy, and Re-evaluation  PLAN FOR NEXT SESSION: BERG or balance test best fit.    Precious Bard, PT 11/11/2022, 4:18 PM

## 2022-11-15 ENCOUNTER — Ambulatory Visit: Payer: BC Managed Care – PPO | Admitting: Occupational Therapy

## 2022-11-15 ENCOUNTER — Ambulatory Visit: Payer: BC Managed Care – PPO | Admitting: Physical Therapy

## 2022-11-15 DIAGNOSIS — R262 Difficulty in walking, not elsewhere classified: Secondary | ICD-10-CM

## 2022-11-15 DIAGNOSIS — M6281 Muscle weakness (generalized): Secondary | ICD-10-CM

## 2022-11-15 DIAGNOSIS — R2681 Unsteadiness on feet: Secondary | ICD-10-CM

## 2022-11-15 NOTE — Therapy (Signed)
OUTPATIENT OCCUPATIONAL THERAPY NEURO EVALUATION  Patient Name: JENNER SKAU MRN: [id number] DOB:07-08-1951, 72 y.o., male Today's Date: 11/15/2022  [facility]: [facility] REFERRING PROVIDER: Hope Pigeon, MD  END OF SESSION:  OT End of Session - 11/15/22 1108     Visit Number 1    Number of Visits 24    Date for OT Re-Evaluation 02/07/23    OT Start Time 0930    OT Stop Time 1020    OT Time Calculation (min) 50 min    Activity Tolerance Patient tolerated treatment well    Behavior During Therapy [facility] for tasks assessed/performed             Past Medical History:  Diagnosis Date   Hypertension    Past Surgical History:  Procedure Laterality Date   TOTAL HIP ARTHROPLASTY Right    Patient Active Problem List   Diagnosis Date Noted   GERD (gastroesophageal reflux disease) 02/26/2021   S/P hip replacement, right 03/04/2020   Primary osteoarthritis of right hip 01/24/2020   Chronic pain of right knee 11/23/2019   Chronic right hip pain 11/23/2019   Osteoarthritis of knee 11/13/2018   History of adenomatous polyp of colon 02/14/2018   History of Barrett's esophagus 02/14/2018    ONSET DATE:  07/25/2022  REFERRING DIAG:  CVA  THERAPY DIAG:  Muscle weakness (generalized)  Rationale for Evaluation and Treatment: Rehabilitation  SUBJECTIVE:   SUBJECTIVE STATEMENT:   Pt. was present for the initial  evaluation with his wife   PERTINENT HISTORY:    Patient sustained a left MCA/PCA ischemic stroke on January 2024 with subsequent seizures. Per chart review, Pt. had a secondary stroke during the hospital stay as well as three seizures. Patient was admitted to [facility] Acute inpatient rehabilitation from 08/10/22-08/31/22 where he was discharged home. Patient was discharged from Home health on 10/28/22 (PT, OT, and ST). PMH includes: GERD, OA, R knee/hip pain, Barrett's esophagus.  PRECAUTIONS: None  WEIGHT BEARING RESTRICTIONS: No  PAIN:  Are you having pain?  No  FALLS: Has patient fallen in last 6 months? No  LIVING ENVIRONMENT: Lives with: lives with their spouse Lives in: House/apartment Stairs: 5 steps to enter; one level home Has following equipment at home: Environmental consultant - 2 wheeled, Wheelchair (manual), shower chair, and bed side commode  PLOF: Independent  PATIENT GOALS: To get to walking  OBJECTIVE:   HAND DOMINANCE: Right  ADLs: Transfers/ambulation related to ADLs: Eating: Independent with utensils, assist with cutting food. Grooming: Independent oral care, wife assists with shaving. UB Dressing: Independent LB Dressing: independent Toileting: Independent Bathing: Once in the shower Supervision with bathing Tub Shower transfers: CGA with showere chair   IADLs: Shopping: Wife performs Light housekeeping: Wife performs most IADLs Meal Prep:  Meal preparation Community mobility: Relies on family and friends for driving. Medication management: Wife sets up, and provides medication Financial management: Wife completes Handwriting:  Pt. Wife reports Difficulty with identifying letters, and numbers   MOBILITY STATUS: Uses a rolling walker  POSTURE COMMENTS:  No Significant postural limitations   ACTIVITY TOLERANCE: Activity tolerance:  Fair  FUNCTIONAL OUTCOME MEASURES: FOTO: 53  TR score: 55  UPPER EXTREMITY ROM:    Active ROM Right Eval WFL Left Eval [facility]  Shoulder flexion    Shoulder abduction    Shoulder adduction    Shoulder extension    Shoulder internal rotation    Shoulder external rotation    Elbow flexion    Elbow extension  Wrist flexion    Wrist extension    Wrist ulnar deviation    Wrist radial deviation    Wrist pronation    Wrist supination    (Blank rows = not tested)  UPPER EXTREMITY MMT:     MMT Right Eval 4/5 Left Eval 4+/5  Shoulder flexion 4-/5 4+/5  Shoulder abduction    Shoulder adduction    Shoulder extension    Shoulder internal rotation    Shoulder external  rotation    Middle trapezius    Lower trapezius    Elbow flexion 4+/5 5/5  Elbow extension 4+/5 5/5  Wrist flexion    Wrist extension 4/5 4+/5  Wrist ulnar deviation    Wrist radial deviation    Wrist pronation    Wrist supination    (Blank rows = not tested)  HAND FUNCTION: Grip strength: Right: 35 lbs; Left: 25 lbs, Lateral pinch: Right: 13 lbs, Left: 11 lbs, and 3 point pinch: Right: 11 lbs, Left: Pt. Unable to assume 3pt. Pinch position  COORDINATION: 9 Hole Peg test: Right: placement/removal of 5 pegs in 1 min. & 5 sec; Left: 50 sec Pt. Has difficulty seeing items in the dish on the right side. Pt. Kept moving the vertical pegs between holes with the right  SENSATION: Light touch: WFL Proprioception: Impaired   EDEMA: Intact   MUSCLE TONE: RUE: Mild flexor tone. Wife reports right hand presents with flexion in the right hand  COGNITION: Overall cognitive status:  Pt.'s wife reports noticing the Pt. has difficulty with numbers, and letters.  VISION:    Wife reports the Pt. frequently misses items on the right, and bumping into obstacle on the right   VISION ASSESSMENT: To be further assessed in functional context  Pt. Has a neuro optometrist appointment scheduled for June.   PERCEPTION:  TBD  PRAXIS: Impaired: Motor planning   TODAY'S TREATMENT:                                                                                                                              DATE: 11/15/2022   Pt. Participated in the initial evaluation  PATIENT EDUCATION: Education details: OT, POC, goals Person educated: Patient Education method: Explanation, Demonstration, Tactile cues, and Verbal cues Education comprehension: verbalized understanding and returned demonstration  HOME EXERCISE PROGRAM:  To be determined, and provided.    GOALS: Goals reviewed with patient?  Yes    SHORT TERM GOALS: Target date: 12/27/2022  Pt. Will require Supervision for bilateral UE  HEPs.  Baseline: Eval: No current HEP  Goal status: INITIAL   LONG TERM GOALS: Target date: 02/07/2023    Pt. Will increase FOTO score by 2 points for Pt. perceived improvement with assessment specific ADLs, and IADLs  Baseline: Eval:  FOTO score: 53 TR score: 55 Goal status: INITIAL  2.  Pt. Will increase bilateral UE strength by 2 mm grades overall to assist with ADLs, and IADLs Baseline: Eval:  Right:Shoulder flexion 4/5, abduction 4-/5, elbow flexion 4+/5, extension 4+/5, wrist extension 4/5 Left Shoulder flexion 4+/5, abduction 4+/5, elbow flexion 5/5, extension 5/5, wrist extension: 4+/5 Goal status: INITIAL  3.  Pt. Will improve bilateral hand [facility] skills by 10 sec. In order to be able to manipulate objects during ADLs, and IADLs Baseline: Eval: Right: Pt. Placed/removed 5 pegs in 1 min, & 5 sec. Pt. Kept moving the vertical pegs between holes with the right.  Left: 50 sec.  Goal status: INITIAL  4.  Pt. Will consistently implements visual compensatory strategies for ADLs, and IADLs Baseline: Eval: Pt. Currently does not utilize. Education to be provided. Goal status: INITIAL  5. Pt. Will improve bilateral grip strength to be able to securely hold items for ADLs, and IADLs. [place]: Right: 35#, Left: 25# Goal status: INITIAL 6. Pt. Will improve bilateral lateral pinch strength to be able to securely hold items for ADLs, and IADLs. [place]ne: Right: 13# , Left: 11# Goal status: INITIAL  ASSESSMENT:  CLINICAL IMPRESSION:  Patient is a 72 y.o. male who was seen today for occupational therapy evaluation for CVA. Pt. Presents with visual, and cognitive changes, weakness, decreased grip strength, pinch strength, and FMC skills which limit his ability to complete ADLs to engage in, and perform ADL, and IADL tasks efficiently. Pt.'s FOTO score is 53 with the TR score of 55. Pt. will benefit from OT services to worked on improving bilateral UE strength, grip strength, pinch strength,  motor control, and FMC skills, and review visual, and cognitive compensatory strategies in order to improve, and maximize independence with ADLs, and IADL tasks.   PERFORMANCE DEFICITS: in functional skills including ADLs, IADLs, coordination, dexterity, ROM, strength, Fine motor control, Gross motor control, decreased knowledge of use of DME, and UE functional use, cognitive skills including, and psychosocial skills including.   IMPAIRMENTS: are limiting patient from ADLs, IADLs, play, and leisure.   CO-MORBIDITIES: may have co-morbidities  that affects occupational performance. Patient will benefit from skilled OT to address above impairments and improve overall function.  MODIFICATION OR ASSISTANCE TO COMPLETE EVALUATION: Min-Moderate modification of tasks or assist with assess necessary to complete an evaluation.  OT OCCUPATIONAL PROFILE AND HISTORY: Detailed assessment: Review of records and additional review of physical, cognitive, psychosocial history related to current functional performance.  CLINICAL DECISION MAKING: Moderate - several treatment options, min-mod task modification necessary  REHAB POTENTIAL: Good  EVALUATION COMPLEXITY: Moderate    PLAN:  OT FREQUENCY: 2x/week  OT DURATION: 12 weeks  PLANNED INTERVENTIONS: self care/ADL training, therapeutic exercise, therapeutic activity, neuromuscular re-education, manual therapy, and functional mobility training  RECOMMENDED OTHER SERVICES:  OT and ST  CONSULTED AND AGREED WITH PLAN OF CARE: Patient  PLAN FOR NEXT SESSION: Initiate Treatment   Olegario Messier, MS, OTR/L   5/06/20204

## 2022-11-15 NOTE — Therapy (Signed)
OUTPATIENT PHYSICAL THERAPY NEURO TREATMENT   Patient Name: Mark Durham MRN: [id number] DOB:Nov 09, 1950, 72 y.o., male Today's Date: 11/15/2022   PCP: [facility] REFERRING PROVIDER: Hope Pigeon MD   END OF SESSION:  PT End of Session - 11/15/22 1020     Visit Number 4    Number of Visits 24    Date for PT Re-Evaluation 01/26/23    Authorization Type [employer]BS COMM PRO    Authorization Time Period 11/03/22-01/26/23    PT Start Time 1020    PT Stop Time 1100    PT Time Calculation (min) 40 min    Equipment Utilized During Treatment Gait belt    Activity Tolerance Patient tolerated treatment well    Behavior During Therapy WFL for tasks assessed/performed             Past Medical History:  Diagnosis Date   Hypertension    Past Surgical History:  Procedure Laterality Date   TOTAL HIP ARTHROPLASTY Right    Patient Active Problem List   Diagnosis Date Noted   GERD (gastroesophageal reflux disease) 02/26/2021   S/P hip replacement, right 03/04/2020   Primary osteoarthritis of right hip 01/24/2020   Chronic pain of right knee 11/23/2019   Chronic right hip pain 11/23/2019   Osteoarthritis of knee 11/13/2018   History of adenomatous polyp of colon 02/14/2018   History of Barrett's esophagus 02/14/2018    ONSET DATE: January 2024  REFERRING DIAG: weakness due to stroke   THERAPY DIAG:  Difficulty in walking, not elsewhere classified  Unsteadiness on feet  Muscle weakness (generalized)  Rationale for Evaluation and Treatment: Rehabilitation  SUBJECTIVE:                                                                                                                                                                                             SUBJECTIVE STATEMENT: Patient is sleepy but no aches or pains. No falls or LOB since last session.  No significant updates  Pt accompanied by: significant other  PERTINENT HISTORY: Patient presents to physical  therapy for weakness s/p stroke . 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 Per documentation he had a secondary stroke during the hospital stay as well as three seizures. Patient was admitted to [facility] Acute inpatient rehabilitation from 08/10/22-08/31/22 where he was discharged home. Patient was discharged from Home health on 10/28/22 (PT, OT, and ST). PMH includes GERD, OA, R knee/hip pain, Barrett's esophagus. Is using a walker, still drags right foot. Needs assistance for transfers but can bath and dress self.   PAIN:  Are you having pain? No  PRECAUTIONS: Fall  WEIGHT BEARING RESTRICTIONS: No  FALLS: Has patient fallen in last 6 months? No  LIVING ENVIRONMENT: Lives with: lives with their family Lives in: House/apartment Stairs: Yes: External: 5 steps; on right going up and on left going up Has following equipment at home: Walker - 2 wheeled, Wheelchair (manual), Grab bars, and lifted toilet and hospital bed, shower chair   PLOF: Independent  PATIENT GOALS: walking with less AD, get back to mowing the yard, gardening, grilling   OBJECTIVE:   DIAGNOSTIC FINDINGS:  CTA of the head:   1. No emergent finding. 2. Atherosclerosis with high-grade left V4, bilateral MCA and left PCA narrowings.   Head CT:   1. No acute finding. 2. 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   CTA of the neck:   1. No emergent finding. 2. Atherosclerosis of the cervical carotids without flow reducing stenosis. There is a notable outpouching from the right ICA bulb consistent with ulcerated plaque. 3. Partially covered 8 mm nodule in the left upper lobe. There is an internal calcification favoring granulomatous disease, but partially covered. Recommend non emergent chest CT without contrast.    COGNITION: Overall cognitive status:  safety awareness, short term memory, directions,  sequencing   SENSATION: WFL LEs  COORDINATION: Nose finger: dyskinesia of RUE Heel slide: limited spatial awareness of RLE    POSTURE: rounded shoulders, posterior pelvic tilt, and weight shift left   LOWER EXTREMITY MMT:    MMT Right Eval Left Eval  Hip flexion 4 3+  Hip extension    Hip abduction 4 3-  Hip adduction 4 3-  Knee flexion 4- 3  Knee extension 4 4-  Ankle dorsiflexion 3 4  Ankle plantarflexion 3 4  Ankle inversion    Ankle eversion    (Blank rows = not tested)   TRANSFERS: Assistive device utilized: Environmental consultant - 2 wheeled  Sit to stand: CGA and Min A Stand to sit: CGA and Min A Chair to chair: Min A   GAIT: Gait pattern: step through pattern, decreased stance time- Right, and poor foot clearance- Right Distance walked: 30 ft  Assistive device utilized: Environmental consultant - 2 wheeled Level of assistance: CGA Comments: significant foot drag of RLE, forgetting of where RLE is, where it is outside of   FUNCTIONAL TESTS:  5 times sit to stand: 38.37 with UE support  10 meter walk test: 19.2 seconds with RW; dragging of R foot  Berg Balance Scale: Perform next time if possible  PATIENT SURVEYS:  FOTO 40  TODAY'S TREATMENT:                                                                                                                              DATE: 11/15/22  Neuro Re-ed:   Gait with RW x 89ft with CGA for safety. Moderate cues for attention to the RLE to improve step  height and length.  Stepping over cane in floor with BUE support x 10 and then performed without UE support x 10 BLE  Foot tap on 4inch step x 10 BLE. Performed 1 bout with RW UE support and 1 bout with no UE support  Side stepping R and L at rail on wall x 5 bil with Lateral foot taps on 6inch step x 8 Bil Stair ascent/descent x 4 with step to gait pattern.  Gait with no AD 2x 50ft and GCA. Noted to have increased foot drag for last 41ft  Throughout session PT provided CGA for safety and  moderate cues for attention to the RLE and improve step height/length as well as motor plannign with novel task of lateral stepping and foot tap on 6"inch step. Only mild LR knee instability noted with fatigue without UE support.    PATIENT EDUCATION: Education details: goals, POC, HEP  Person educated: Patient and Spouse Education method: Explanation, Demonstration, Tactile cues, and Verbal cues Education comprehension: verbalized understanding, returned demonstration, verbal cues required, and tactile cues required  HOME EXERCISE PROGRAM: Access Code: [email or web address] URL: [email or web address] Date: 11/03/2022 Prepared by: Precious Bard  Exercises - Seated March  - 1 x daily - 7 x weekly - 2 sets - 10 reps - 5 hold - Seated Long Arc Quad  - 1 x daily - 7 x weekly - 2 sets - 10 reps - 5 hold - Seated Heel Toe Raises  - 1 x daily - 7 x weekly - 2 sets - 10 reps - 5 hold - Seated Hip Abduction  - 1 x daily - 7 x weekly - 2 sets - 10 reps - 5 hold - Seated Isometric Hip Adduction with Ball  - 1 x daily - 7 x weekly - 2 sets - 10 reps - 5 hold  GOALS: Goals reviewed with patient? Yes  SHORT TERM GOALS: Target date: 12/01/2022    Patient will be independent in home exercise program to improve strength/mobility for better functional independence with ADLs. Baseline:4/24: HEP given  Goal status: INITIAL    LONG TERM GOALS: Target date: 01/26/2023    Patient will increase FOTO score to equal to or greater than  51   to demonstrate statistically significant improvement in mobility and quality of life.   Baseline: 40 Goal status: INITIAL  2.  Patient (> 86 years old) will complete five times sit to stand test in < 15 seconds indicating an increased LE strength and improved balance. Baseline: 4/24: 38.37 with BUE support Goal status: INITIAL  3.  Patient will increase 10 meter walk test to >1.31m/s as to improve gait speed for better community ambulation and to reduce fall  risk. Baseline: 4/24: 19.2 seconds with RW; dragging of R foot  Goal status: INITIAL  4.  Patient will increase Berg Balance score by > 6 points to demonstrate decreased fall risk during functional activities. Baseline: 4/24: see next session 5/2: 30/56 Goal status: INITIAL    ASSESSMENT:  CLINICAL IMPRESSION: Pt put forth good effort throughout PT treatment on this day. PT treatment focused on dynamic balance training as well as improved attention to the RLE to increase step length/height in functional movements. Pt able to demonstrate increased step height to reduce foot drag on the RLE for first bout of gait training with RW compared to last bout of gait training without UE support.  Patient will benefit from skilled physical therapy to improve mobility, stability strength, and quality of life.  OBJECTIVE IMPAIRMENTS: Abnormal gait, cardiopulmonary status limiting activity, decreased activity tolerance, decreased balance, decreased cognition, decreased coordination, decreased endurance, decreased knowledge of condition, decreased knowledge of use of DME, decreased mobility, difficulty walking, decreased strength, decreased safety awareness, impaired perceived functional ability, impaired flexibility, impaired UE functional use, impaired vision/preception, improper body mechanics, and postural dysfunction.   ACTIVITY LIMITATIONS: carrying, lifting, bending, sitting, standing, squatting, sleeping, stairs, transfers, bed mobility, bathing, toileting, reach over head, hygiene/grooming, locomotion level, and caring for others  PARTICIPATION LIMITATIONS: meal prep, cleaning, laundry, medication management, personal finances, interpersonal relationship, driving, shopping, community activity, and yard work  PERSONAL FACTORS: Age, Past/current experiences, Time since onset of injury/illness/exacerbation, Transportation, and 3+ comorbidities: GERD, OA, R knee/hip pain, Barrett's esophagus  are also  affecting patient's functional outcome.   REHAB POTENTIAL: Good  CLINICAL DECISION MAKING: Evolving/moderate complexity  EVALUATION COMPLEXITY: Moderate  PLAN:  PT FREQUENCY: 2x/week  PT DURATION: 12 weeks  PLANNED INTERVENTIONS: Therapeutic exercises, Therapeutic activity, Neuromuscular re-education, Balance training, Gait training, Patient/Family education, Self Care, Joint mobilization, Joint manipulation, Stair training, Vestibular training, Canalith repositioning, Visual/preceptual remediation/compensation, Orthotic/Fit training, Electrical stimulation, Wheelchair mobility training, Spinal mobilization, Cryotherapy, Moist heat, Splintting, Taping, Ultrasound, Ionotophoresis 4mg /ml Dexamethasone, Manual therapy, and Re-evaluation  PLAN FOR NEXT SESSION:   Continue dynamic balance and strength training to improve attention to RLE and reduce foot drag.    Golden Pop, PT 11/15/2022, 11:07 AM

## 2022-11-18 ENCOUNTER — Ambulatory Visit: Payer: BC Managed Care – PPO | Admitting: Physical Therapy

## 2022-11-18 DIAGNOSIS — M6281 Muscle weakness (generalized): Secondary | ICD-10-CM

## 2022-11-18 DIAGNOSIS — R262 Difficulty in walking, not elsewhere classified: Secondary | ICD-10-CM | POA: Diagnosis not present

## 2022-11-18 DIAGNOSIS — R2681 Unsteadiness on feet: Secondary | ICD-10-CM

## 2022-11-18 NOTE — Therapy (Signed)
OUTPATIENT PHYSICAL THERAPY NEURO TREATMENT   Patient Name: Mark Durham MRN: [id number] DOB:1951-03-28, 72 y.o., male Today's Date: 11/18/2022   PCP: [facility] REFERRING PROVIDER: Hope Pigeon MD   END OF SESSION:  PT End of Session - 11/18/22 0849     Visit Number 5    Number of Visits 24    Date for PT Re-Evaluation 01/26/23    Authorization Type [employer]BS COMM PRO    Authorization Time Period 11/03/22-01/26/23    PT Start Time 0850    PT Stop Time 0931    PT Time Calculation (min) 41 min    Equipment Utilized During Treatment Gait belt    Activity Tolerance Patient tolerated treatment well    Behavior During Therapy [facility] for tasks assessed/performed             Past Medical History:  Diagnosis Date   Hypertension    Past Surgical History:  Procedure Laterality Date   TOTAL HIP ARTHROPLASTY Right    Patient Active Problem List   Diagnosis Date Noted   GERD (gastroesophageal reflux disease) 02/26/2021   S/P hip replacement, right 03/04/2020   Primary osteoarthritis of right hip 01/24/2020   Chronic pain of right knee 11/23/2019   Chronic right hip pain 11/23/2019   Osteoarthritis of knee 11/13/2018   History of adenomatous polyp of colon 02/14/2018   History of Barrett's esophagus 02/14/2018    ONSET DATE: January 2024  REFERRING DIAG: weakness due to stroke   THERAPY DIAG:  Muscle weakness (generalized)  Difficulty in walking, not elsewhere classified  Unsteadiness on feet  Rationale for Evaluation and Treatment: Rehabilitation  SUBJECTIVE:                                                                                                                                                                                             SUBJECTIVE STATEMENT: Patient arrived early for PT treatment,b   Pt accompanied by: significant other  PERTINENT HISTORY: Patient presents to physical therapy for weakness s/p stroke . 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 Per documentation he had a secondary stroke during the hospital stay as well as three seizures. Patient was admitted to [facility] Acute inpatient rehabilitation from 08/10/22-08/31/22 where he was discharged home. Patient was discharged from Home health on 10/28/22 (PT, OT, and ST). PMH includes GERD, OA, R knee/hip pain, Barrett's esophagus. Is using a walker, still drags right foot. Needs assistance for transfers but can bath and dress self.   PAIN:  Are you having pain? No  PRECAUTIONS: Fall  WEIGHT BEARING RESTRICTIONS:  No  FALLS: Has patient fallen in last 6 months? No  LIVING ENVIRONMENT: Lives with: lives with their family Lives in: House/apartment Stairs: Yes: External: 5 steps; on right going up and on left going up Has following equipment at home: Walker - 2 wheeled, Wheelchair (manual), Grab bars, and lifted toilet and hospital bed, shower chair   PLOF: Independent  PATIENT GOALS: walking with less AD, get back to mowing the yard, gardening, grilling   OBJECTIVE:   DIAGNOSTIC FINDINGS:  CTA of the head:   1. No emergent finding. 2. Atherosclerosis with high-grade left V4, bilateral MCA and left PCA narrowings.   Head CT:   1. No acute finding. 2. 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   CTA of the neck:   1. No emergent finding. 2. Atherosclerosis of the cervical carotids without flow reducing stenosis. There is a notable outpouching from the right ICA bulb consistent with ulcerated plaque. 3. Partially covered 8 mm nodule in the left upper lobe. There is an internal calcification favoring granulomatous disease, but partially covered. Recommend non emergent chest CT without contrast.    COGNITION: Overall cognitive status:  safety awareness, short term memory, directions, sequencing   SENSATION: WFL LEs  COORDINATION: Nose finger:  dyskinesia of RUE Heel slide: limited spatial awareness of RLE    POSTURE: rounded shoulders, posterior pelvic tilt, and weight shift left   LOWER EXTREMITY MMT:    MMT Right Eval Left Eval  Hip flexion 4 3+  Hip extension    Hip abduction 4 3-  Hip adduction 4 3-  Knee flexion 4- 3  Knee extension 4 4-  Ankle dorsiflexion 3 4  Ankle plantarflexion 3 4  Ankle inversion    Ankle eversion    (Blank rows = not tested)   TRANSFERS: Assistive device utilized: Environmental consultant - 2 wheeled  Sit to stand: CGA and Min A Stand to sit: CGA and Min A Chair to chair: Min A   GAIT: Gait pattern: step through pattern, decreased stance time- Right, and poor foot clearance- Right Distance walked: 30 ft  Assistive device utilized: Environmental consultant - 2 wheeled Level of assistance: CGA Comments: significant foot drag of RLE, forgetting of where RLE is, where it is outside of   FUNCTIONAL TESTS:  5 times sit to stand: 38.37 with UE support  10 meter walk test: 19.2 seconds with RW; dragging of R foot  Berg Balance Scale: Perform next time if possible  PATIENT SURVEYS:  FOTO 40  TODAY'S TREATMENT:                                                                                                                              DATE: 11/18/22  Neuro Re-ed:   Gait with RW x 158ft with CGA for safety. Min cues for attention to the RLE to improve step height and length.  Stepping over cane in floor with BUE support  x 8  and then performed without UE support x 5 BLE  Foot tap on 6inch step x 10 BLE. Performed 1 bout with RW UE support and 1 bout with no UE support  Side stepping no UE support  51ft bil 2x with Forward/reverse 44ft x 2 with no UE support  Standing on airex pad. Cross body reach x 10 to touch contralateral handle of RW. CGA for safety to prevent lateral/R LOB intermittently   Gait with no AD 2x 141ft and GCA. Noted to have increased foot drag for last 20 to 24ft, but able to correct with  instruction from PT for attention to the RLE  Throughout session PT provided CGA for safety and min cues for attention to the RLE and improve step height/length as well as motor planning with novel task of lateral stepping and foot tap on 6"inch step. Only mild LR knee instability noted with fatigue without UE support.    PATIENT EDUCATION: Education details: goals, POC, HEP  Person educated: Patient and Spouse Education method: Explanation, Demonstration, Tactile cues, and Verbal cues Education comprehension: verbalized understanding, returned demonstration, verbal cues required, and tactile cues required  HOME EXERCISE PROGRAM: Access Code: [email or web address] URL: [email or web address] Date: 11/03/2022 Prepared by: Precious Bard  Exercises - Seated March  - 1 x daily - 7 x weekly - 2 sets - 10 reps - 5 hold - Seated Long Arc Quad  - 1 x daily - 7 x weekly - 2 sets - 10 reps - 5 hold - Seated Heel Toe Raises  - 1 x daily - 7 x weekly - 2 sets - 10 reps - 5 hold - Seated Hip Abduction  - 1 x daily - 7 x weekly - 2 sets - 10 reps - 5 hold - Seated Isometric Hip Adduction with Ball  - 1 x daily - 7 x weekly - 2 sets - 10 reps - 5 hold  GOALS: Goals reviewed with patient? Yes  SHORT TERM GOALS: Target date: 12/01/2022    Patient will be independent in home exercise program to improve strength/mobility for better functional independence with ADLs. Baseline:4/24: HEP given  Goal status: INITIAL    LONG TERM GOALS: Target date: 01/26/2023    Patient will increase FOTO score to equal to or greater than  51   to demonstrate statistically significant improvement in mobility and quality of life.   Baseline: 40 Goal status: INITIAL  2.  Patient (> 39 years old) will complete five times sit to stand test in < 15 seconds indicating an increased LE strength and improved balance. Baseline: 4/24: 38.37 with BUE support Goal status: INITIAL  3.  Patient will increase 10 meter walk  test to >1.59m/s as to improve gait speed for better community ambulation and to reduce fall risk. Baseline: 4/24: 19.2 seconds with RW; dragging of R foot  Goal status: INITIAL  4.  Patient will increase Berg Balance score by > 6 points to demonstrate decreased fall risk during functional activities. Baseline: 4/24: see next session 5/2: 30/56 Goal status: INITIAL    ASSESSMENT:  CLINICAL IMPRESSION: Pt put forth good effort throughout PT treatment on this day. PT instructed pt in variable gait training to improve neural motor recruitment and attention to the RLE in functional movement patterns. Pt demonstrating improved attention to the RLE throughout session compared to prior session. No knee instability noted and able to improve step length and heel contact on the RLE with instruction from PT for all NMR.  Patient will benefit from skilled physical therapy to improve mobility, stability strength, and quality of life.   OBJECTIVE IMPAIRMENTS: Abnormal gait, cardiopulmonary status limiting activity, decreased activity tolerance, decreased balance, decreased cognition, decreased coordination, decreased endurance, decreased knowledge of condition, decreased knowledge of use of DME, decreased mobility, difficulty walking, decreased strength, decreased safety awareness, impaired perceived functional ability, impaired flexibility, impaired UE functional use, impaired vision/preception, improper body mechanics, and postural dysfunction.   ACTIVITY LIMITATIONS: carrying, lifting, bending, sitting, standing, squatting, sleeping, stairs, transfers, bed mobility, bathing, toileting, reach over head, hygiene/grooming, locomotion level, and caring for others  PARTICIPATION LIMITATIONS: meal prep, cleaning, laundry, medication management, personal finances, interpersonal relationship, driving, shopping, community activity, and yard work  PERSONAL FACTORS: Age, Past/current experiences, Time since onset of  injury/illness/exacerbation, Transportation, and 3+ comorbidities: GERD, OA, R knee/hip pain, Barrett's esophagus  are also affecting patient's functional outcome.   REHAB POTENTIAL: Good  CLINICAL DECISION MAKING: Evolving/moderate complexity  EVALUATION COMPLEXITY: Moderate  PLAN:  PT FREQUENCY: 2x/week  PT DURATION: 12 weeks  PLANNED INTERVENTIONS: Therapeutic exercises, Therapeutic activity, Neuromuscular re-education, Balance training, Gait training, Patient/Family education, Self Care, Joint mobilization, Joint manipulation, Stair training, Vestibular training, Canalith repositioning, Visual/preceptual remediation/compensation, Orthotic/Fit training, Electrical stimulation, Wheelchair mobility training, Spinal mobilization, Cryotherapy, Moist heat, Splintting, Taping, Ultrasound, Ionotophoresis 4mg /ml Dexamethasone, Manual therapy, and Re-evaluation  PLAN FOR NEXT SESSION:   Continue dynamic balance and strength training to improve attention to RLE and reduce foot drag.  Adjust HEP as appropriate.   Grier Rocher PT, DPT  Physical Therapist - [facility]  10:02 AM 11/18/22

## 2022-11-22 ENCOUNTER — Ambulatory Visit: Payer: BC Managed Care – PPO | Admitting: Physical Therapy

## 2022-11-22 DIAGNOSIS — R262 Difficulty in walking, not elsewhere classified: Secondary | ICD-10-CM

## 2022-11-22 DIAGNOSIS — R2681 Unsteadiness on feet: Secondary | ICD-10-CM

## 2022-11-22 DIAGNOSIS — M6281 Muscle weakness (generalized): Secondary | ICD-10-CM

## 2022-11-22 NOTE — Therapy (Signed)
OUTPATIENT PHYSICAL THERAPY NEURO TREATMENT   Patient Name: Mark Durham MRN: [id number] DOB:1950/11/24, 72 y.o., male Today's Date: 11/18/2022   PCP: [facility] REFERRING PROVIDER: Hope Pigeon MD   END OF SESSION:  PT End of Session - 11/18/22 0849     Visit Number 6   Number of Visits 24    Date for PT Re-Evaluation 01/26/23    Authorization Type [employer] COMM PRO    Authorization Time Period 11/03/22-01/26/23    PT Start Time 1019   PT Stop Time 1100   PT Time Calculation (min) 41 min    Equipment Utilized During Treatment Gait belt    Activity Tolerance Patient tolerated treatment well    Behavior During Therapy WFL for tasks assessed/performed             Past Medical History:  Diagnosis Date   Hypertension    Past Surgical History:  Procedure Laterality Date   TOTAL HIP ARTHROPLASTY Right    Patient Active Problem List   Diagnosis Date Noted   GERD (gastroesophageal reflux disease) 02/26/2021   S/P hip replacement, right 03/04/2020   Primary osteoarthritis of right hip 01/24/2020   Chronic pain of right knee 11/23/2019   Chronic right hip pain 11/23/2019   Osteoarthritis of knee 11/13/2018   History of adenomatous polyp of colon 02/14/2018   History of Barrett's esophagus 02/14/2018    ONSET DATE: January 2024  REFERRING DIAG: weakness due to stroke   THERAPY DIAG:  Muscle weakness (generalized)  Difficulty in walking, not elsewhere classified  Unsteadiness on feet  Rationale for Evaluation and Treatment: Rehabilitation  SUBJECTIVE:                                                                                                                                                                                             SUBJECTIVE STATEMENT: Patient arrived early for PT treatment,b   Pt accompanied by: significant other  PERTINENT HISTORY: Patient presents to physical therapy for weakness s/p stroke . 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 Per documentation he had a secondary stroke during the hospital stay as well as three seizures. Patient was admitted to [facility] inpatient rehabilitation from 08/10/22-08/31/22 where he was discharged home. Patient was discharged from Home health on 10/28/22 (PT, OT, and ST). PMH includes GERD, OA, R knee/hip pain, Barrett's esophagus. Is using a walker, still drags right foot. Needs assistance for transfers but can bath and dress self.   PAIN:  Are you having pain? No  PRECAUTIONS: Fall  WEIGHT BEARING RESTRICTIONS: No  FALLS:  Has patient fallen in last 6 months? No  LIVING ENVIRONMENT: Lives with: lives with their family Lives in: House/apartment Stairs: Yes: External: 5 steps; on right going up and on left going up Has following equipment at home: Walker - 2 wheeled, Wheelchair (manual), Grab bars, and lifted toilet and hospital bed, shower chair   PLOF: Independent  PATIENT GOALS: walking with less AD, get back to mowing the yard, gardening, grilling   OBJECTIVE:   DIAGNOSTIC FINDINGS:  CTA of the head:   1. No emergent finding. 2. Atherosclerosis with high-grade left V4, bilateral MCA and left PCA narrowings.   Head CT:   1. No acute finding. 2. 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   CTA of the neck:   1. No emergent finding. 2. Atherosclerosis of the cervical carotids without flow reducing stenosis. There is a notable outpouching from the right ICA bulb consistent with ulcerated plaque. 3. Partially covered 8 mm nodule in the left upper lobe. There is an internal calcification favoring granulomatous disease, but partially covered. Recommend non emergent chest CT without contrast.    COGNITION: Overall cognitive status:  safety awareness, short term memory, directions, sequencing   SENSATION: WFL LEs  COORDINATION: Nose finger: dyskinesia of  RUE Heel slide: limited spatial awareness of RLE    POSTURE: rounded shoulders, posterior pelvic tilt, and weight shift left   LOWER EXTREMITY MMT:    MMT Right Eval Left Eval  Hip flexion 4 3+  Hip extension    Hip abduction 4 3-  Hip adduction 4 3-  Knee flexion 4- 3  Knee extension 4 4-  Ankle dorsiflexion 3 4  Ankle plantarflexion 3 4  Ankle inversion    Ankle eversion    (Blank rows = not tested)   TRANSFERS: Assistive device utilized: Environmental consultant - 2 wheeled  Sit to stand: CGA and Min A Stand to sit: CGA and Min A Chair to chair: Min A   GAIT: Gait pattern: step through pattern, decreased stance time- Right, and poor foot clearance- Right Distance walked: 30 ft  Assistive device utilized: Environmental consultant - 2 wheeled Level of assistance: CGA Comments: significant foot drag of RLE, forgetting of where RLE is, where it is outside of   FUNCTIONAL TESTS:  5 times sit to stand: 38.37 with UE support  10 meter walk test: 19.2 seconds with RW; dragging of R foot  Berg Balance Scale: Perform next time if possible  PATIENT SURVEYS:  FOTO 40  TODAY'S TREATMENT:                                                                                                                              DATE: 11/18/22  Neuro Re-ed:  Lora Paula level 2 x 2.5min Level 3 x 1.5 min  Level 1 cool down x 1 min   Aerobic step: Forward ascent/reverse descent x16. Attempted to have alternating BLE, but pt unable to  sequecne Lateral step up/down x 8 bil with max cues for attention to the RLE and increased step length   Seated LAQ 2.5 ankle weight x 12 with 3 sec hold.  Seated HS curl RTB x 10  Seated hip abduction RTB x 10  Seated hip push down x 10 RTB  Seated isometric hip adduction x 10 with 3 sec hold  Side stepping at rail 62ft x 8 bil with 2,5# ankle weights.     PATIENT EDUCATION: Education details: goals, POC, HEP Pt educated throughout session about proper posture and technique with  exercises. Improved exercise technique, movement at target joints, use of target muscles after min to mod verbal, visual, tactile cues.  Person educated: Patient and Spouse Education method: Explanation, Demonstration, Tactile cues, and Verbal cues Education comprehension: verbalized understanding, returned demonstration, verbal cues required, and tactile cues required  HOME EXERCISE PROGRAM: Access Code: [email or web address] URL: [email or web address] Date: 11/03/2022 Prepared by: Precious Bard  Exercises - Seated March  - 1 x daily - 7 x weekly - 2 sets - 10 reps - 5 hold - Seated Long Arc Quad  - 1 x daily - 7 x weekly - 2 sets - 10 reps - 5 hold - Seated Heel Toe Raises  - 1 x daily - 7 x weekly - 2 sets - 10 reps - 5 hold - Seated Hip Abduction  - 1 x daily - 7 x weekly - 2 sets - 10 reps - 5 hold - Seated Isometric Hip Adduction with Ball  - 1 x daily - 7 x weekly - 2 sets - 10 reps - 5 hold  GOALS: Goals reviewed with patient? Yes  SHORT TERM GOALS: Target date: 12/01/2022    Patient will be independent in home exercise program to improve strength/mobility for better functional independence with ADLs. Baseline:4/24: HEP given  Goal status: INITIAL    LONG TERM GOALS: Target date: 01/26/2023    Patient will increase FOTO score to equal to or greater than  51   to demonstrate statistically significant improvement in mobility and quality of life.   Baseline: 40 Goal status: INITIAL  2.  Patient (> 27 years old) will complete five times sit to stand test in < 15 seconds indicating an increased LE strength and improved balance. Baseline: 4/24: 38.37 with BUE support Goal status: INITIAL  3.  Patient will increase 10 meter walk test to >1.84m/s as to improve gait speed for better community ambulation and to reduce fall risk. Baseline: 4/24: 19.2 seconds with RW; dragging of R foot  Goal status: INITIAL  4.  Patient will increase Berg Balance score by > 6 points to  demonstrate decreased fall risk during functional activities. Baseline: 4/24: see next session 5/2: 30/56 Goal status: INITIAL    ASSESSMENT:  CLINICAL IMPRESSION: Pt put forth good effort throughout PT treatment on this day. PT instructed pt BLE strengthening and dynamic balance training. Noted to have improved step length on the RLE with improved attention to the RLE functionally throughout session.  Patient will benefit from skilled physical therapy to improve mobility, stability strength, and quality of life.   OBJECTIVE IMPAIRMENTS: Abnormal gait, cardiopulmonary status limiting activity, decreased activity tolerance, decreased balance, decreased cognition, decreased coordination, decreased endurance, decreased knowledge of condition, decreased knowledge of use of DME, decreased mobility, difficulty walking, decreased strength, decreased safety awareness, impaired perceived functional ability, impaired flexibility, impaired UE functional use, impaired vision/preception, improper body mechanics, and postural dysfunction.   ACTIVITY LIMITATIONS: carrying, lifting,  bending, sitting, standing, squatting, sleeping, stairs, transfers, bed mobility, bathing, toileting, reach over head, hygiene/grooming, locomotion level, and caring for others  PARTICIPATION LIMITATIONS: meal prep, cleaning, laundry, medication management, personal finances, interpersonal relationship, driving, shopping, community activity, and yard work  PERSONAL FACTORS: Age, Past/current experiences, Time since onset of injury/illness/exacerbation, Transportation, and 3+ comorbidities: GERD, OA, R knee/hip pain, Barrett's esophagus  are also affecting patient's functional outcome.   REHAB POTENTIAL: Good  CLINICAL DECISION MAKING: Evolving/moderate complexity  EVALUATION COMPLEXITY: Moderate  PLAN:  PT FREQUENCY: 2x/week  PT DURATION: 12 weeks  PLANNED INTERVENTIONS: Therapeutic exercises, Therapeutic activity,  Neuromuscular re-education, Balance training, Gait training, Patient/Family education, Self Care, Joint mobilization, Joint manipulation, Stair training, Vestibular training, Canalith repositioning, Visual/preceptual remediation/compensation, Orthotic/Fit training, Electrical stimulation, Wheelchair mobility training, Spinal mobilization, Cryotherapy, Moist heat, Splintting, Taping, Ultrasound, Ionotophoresis 4mg /ml Dexamethasone, Manual therapy, and Re-evaluation  PLAN FOR NEXT SESSION:   Continue dynamic balance and strength training to improve attention to RLE and reduce foot drag.  Adjust HEP as appropriate.   Grier Rocher PT, DPT  Physical Therapist - [facility]  10:02 AM 11/18/22

## 2022-11-24 ENCOUNTER — Ambulatory Visit: Payer: BC Managed Care – PPO

## 2022-11-24 ENCOUNTER — Ambulatory Visit: Payer: BC Managed Care – PPO | Admitting: Speech Pathology

## 2022-11-24 ENCOUNTER — Ambulatory Visit: Payer: BC Managed Care – PPO | Admitting: Physical Therapy

## 2022-11-24 DIAGNOSIS — M6281 Muscle weakness (generalized): Secondary | ICD-10-CM

## 2022-11-24 DIAGNOSIS — Z8673 Personal history of transient ischemic attack (TIA), and cerebral infarction without residual deficits: Secondary | ICD-10-CM

## 2022-11-24 DIAGNOSIS — R262 Difficulty in walking, not elsewhere classified: Secondary | ICD-10-CM | POA: Diagnosis not present

## 2022-11-24 DIAGNOSIS — R41841 Cognitive communication deficit: Secondary | ICD-10-CM

## 2022-11-24 DIAGNOSIS — R2681 Unsteadiness on feet: Secondary | ICD-10-CM

## 2022-11-24 DIAGNOSIS — R413 Other amnesia: Secondary | ICD-10-CM

## 2022-11-24 DIAGNOSIS — R278 Other lack of coordination: Secondary | ICD-10-CM

## 2022-11-24 NOTE — Therapy (Addendum)
OUTPATIENT OCCUPATIONAL THERAPY NEURO TREATMENT NOTE  Patient Name: Mark Durham MRN: [id number] DOB:Mar 26, 1951, 72 y.o., male Today's Date: 11/24/2022  [facility]P: [facility] REFERRING PROVIDER: Hope Pigeon, MD  END OF SESSION:  OT End of Session - 11/24/22 1024     Visit Number 2    Number of Visits 24    Date for OT Re-Evaluation 02/07/23    Progress Note Due on Visit 10    OT Start Time 0915    OT Stop Time 1000    OT Time Calculation (min) 45 min    Equipment Utilized During Treatment transport chair    Activity Tolerance Patient tolerated treatment well    Behavior During Therapy WFL for tasks assessed/performed            Past Medical History:  Diagnosis Date   Hypertension    Past Surgical History:  Procedure Laterality Date   TOTAL HIP ARTHROPLASTY Right    Patient Active Problem List   Diagnosis Date Noted   GERD (gastroesophageal reflux disease) 02/26/2021   S/P hip replacement, right 03/04/2020   Primary osteoarthritis of right hip 01/24/2020   Chronic pain of right knee 11/23/2019   Chronic right hip pain 11/23/2019   Osteoarthritis of knee 11/13/2018   History of adenomatous polyp of colon 02/14/2018   History of Barrett's esophagus 02/14/2018   ONSET DATE:  07/25/2022  REFERRING DIAG:  CVA  THERAPY DIAG:  Muscle weakness (generalized)  Other lack of coordination  Rationale for Evaluation and Treatment: Rehabilitation  SUBJECTIVE:  SUBJECTIVE STATEMENT: Spouse reports that she continues to have to tell patient to look to his right as pt just feels for things on his right side without turning his head.  Accompanied by: spouse  PERTINENT HISTORY:   Patient sustained a left MCA/PCA ischemic stroke on January 2024 with subsequent seizures. Per chart review, Pt. had a secondary stroke during the hospital stay as well as three seizures. Patient was admitted to [facility] inpatient rehabilitation from 08/10/22-08/31/22 where he was  discharged home. Patient was discharged from Home health on 10/28/22 (PT, OT, and ST). PMH includes: GERD, OA, R knee/hip pain, Barrett's esophagus.  PRECAUTIONS: None  WEIGHT BEARING RESTRICTIONS: No  PAIN:  Are you having pain? No  FALLS: Has patient fallen in last 6 months? No  LIVING ENVIRONMENT: Lives with: lives with their spouse Lives in: House/apartment Stairs: 5 steps to enter; one level home Has following equipment at home: Environmental consultant - 2 wheeled, Wheelchair (manual), shower chair, and bed side commode  PLOF: Independent  PATIENT GOALS: To get to walking  OBJECTIVE:   HAND DOMINANCE: Right  ADLs: Transfers/ambulation related to ADLs: Eating: Independent with utensils, assist with cutting food. Grooming: Independent oral care, wife assists with shaving. UB Dressing: Independent LB Dressing: independent Toileting: Independent Bathing: Once in the shower Supervision with bathing Tub Shower transfers: CGA with showere chair   IADLs: Shopping: Wife performs Light housekeeping: Wife performs most IADLs Meal Prep:  Meal preparation Community mobility: Relies on family and friends for driving. Medication management: Wife sets up, and provides medication Financial management: Wife completes Handwriting:  Pt. Wife reports Difficulty with identifying letters, and numbers   MOBILITY STATUS: Uses a rolling walker  POSTURE COMMENTS:  No Significant postural limitations  ACTIVITY TOLERANCE: Activity tolerance:  Fair  FUNCTIONAL OUTCOME MEASURES: FOTO: 53  TR score: 55  UPPER EXTREMITY ROM:    Active ROM Right Eval WFL Left Eval [facility]  Shoulder flexion  Shoulder abduction    Shoulder adduction    Shoulder extension    Shoulder internal rotation    Shoulder external rotation    Elbow flexion    Elbow extension    Wrist flexion    Wrist extension    Wrist ulnar deviation    Wrist radial deviation    Wrist pronation    Wrist supination    (Blank rows =  not tested)  UPPER EXTREMITY MMT:     MMT Right Eval 4/5 Left Eval 4+/5  Shoulder flexion 4-/5 4+/5  Shoulder abduction    Shoulder adduction    Shoulder extension    Shoulder internal rotation    Shoulder external rotation    Middle trapezius    Lower trapezius    Elbow flexion 4+/5 5/5  Elbow extension 4+/5 5/5  Wrist flexion    Wrist extension 4/5 4+/5  Wrist ulnar deviation    Wrist radial deviation    Wrist pronation    Wrist supination    (Blank rows = not tested)  HAND FUNCTION: Grip strength: Right: 35 lbs; Left: 25 lbs, Lateral pinch: Right: 13 lbs, Left: 11 lbs, and 3 point pinch: Right: 11 lbs, Left: Pt. Unable to assume 3pt. Pinch position  COORDINATION: 9 Hole Peg test: Right: placement/removal of 5 pegs in 1 min. & 5 sec; Left: 50 sec Pt. Has difficulty seeing items in the dish on the right side. Pt. Kept moving the vertical pegs between holes with the right  SENSATION: Light touch: WFL Proprioception: Impaired   EDEMA: Intact   MUSCLE TONE: RUE: Mild flexor tone. Wife reports right hand presents with flexion in the right hand  COGNITION: Overall cognitive status:  Pt.'s wife reports noticing the Pt. has difficulty with numbers, and letters.  VISION:    Wife reports the Pt. frequently misses items on the right, and bumping into obstacle on the right   VISION ASSESSMENT: To be further assessed in functional context  11/24/22: impaired smooth pursuits all quadrants, requiring additional head turns; impaired saccades (slow pace, requiring head turns).  Visual fields appear grossly intact, but difficult to assess d/t decreased attention and ability to consistently follow a 2 step command. Moderate-severe R sided inattention noted.  Pt. Has a neuro optometrist appointment scheduled for June.   PERCEPTION:  TBD  PRAXIS: Impaired: Motor planning  TODAY'S TREATMENT:                                                                                                                               DATE: 11/24/2022  Therapeutic Exercise: Issued turquoise theraputty and instructed pt in strengthening and coordination exercises for R/L hands, including gross grasping, lateral/2 point/3 point pinching, digit abd/add, and digging coins out of putty.  Instructed pt to focus primarily on R hand, but can work with L at home, as well, d/t bilat hand weakness.  Pt required intermittent tactile cues to transfer putty to R hand,  and required mod vc and tactile cues to perform exercises with proper form and technique.  Encouraged completion 5-10 min, 1-2x per day.  Handout issued for carry over at home.  Spouse also present for exercises and will be able to help with carryover as needed.  Neuro re-ed: Facilitated [facility] skills and visual scanning across table top, working to flip and move [place] discs in and out of grid.  OT placed pencil on table top in pt's R visual field, and pt was cued to move discs beyond the pencil to the R.  Pt also practiced stacking 3 discs at a time on table top, building with discs to the R of pt's midline at table top level.  Pt required intermittent vc for head turns to the R rather than moving his hand and feeling for discs.    PATIENT EDUCATION: Education details: Increasing R sided visual attention: family to sit to the R of patient at kitchen table; place ADL supplies to pt's R for set up and provide reminders as needed for head turns (ie remote control, cup, toothbrush) Person educated: Patient, spouse Education method: explanation, vc Education comprehension: verbalized understanding and returned demonstration  HOME EXERCISE PROGRAM: Turquoise theraputty; recommended solitaire for increasing R sided visual attention  GOALS: Goals reviewed with patient?  Yes   SHORT TERM GOALS: Target date: 12/27/2022  Pt. Will require Supervision for bilateral UE HEPs.  Baseline: Eval: No current HEP  Goal status: INITIAL  LONG TERM GOALS:  Target date: 02/07/2023    Pt. Will increase FOTO score by 2 points for Pt. perceived improvement with assessment specific ADLs, and IADLs  Baseline: Eval:  FOTO score: 53 TR score: 55 Goal status: INITIAL  2.  Pt. Will increase bilateral UE strength by 2 mm grades overall to assist with ADLs, and IADLs Baseline: Eval: Right:Shoulder flexion 4/5, abduction 4-/5, elbow flexion 4+/5, extension 4+/5, wrist extension 4/5 Left Shoulder flexion 4+/5, abduction 4+/5, elbow flexion 5/5, extension 5/5, wrist extension: 4+/5 Goal status: INITIAL  3.  Pt. Will improve bilateral hand [facility] skills by 10 sec. In order to be able to manipulate objects during ADLs, and IADLs Baseline: Eval: Right: Pt. Placed/removed 5 pegs in 1 min, & 5 sec. Pt. Kept moving the vertical pegs between holes with the right.  Left: 50 sec.  Goal status: INITIAL  4.  Pt. Will consistently implements visual compensatory strategies for ADLs, and IADLs Baseline: Eval: Pt. Currently does not utilize. Education to be provided. Goal status: INITIAL  5. Pt. Will improve bilateral grip strength to be able to securely hold items for ADLs, and IADLs. [place]: Right: 35#, Left: 25# Goal status: INITIAL 6. Pt. Will improve bilateral lateral pinch strength to be able to securely hold items for ADLs, and IADLs. [place]: Right: 13# , Left: 11# Goal status: INITIAL  ASSESSMENT: CLINICAL IMPRESSION: Spouse reports that she continues to have to tell patient to look to his right as pt just feels for things on his right side without turning his head.  Pt required consistent reminders at start of session for head turns to the R visual field when working with [facility] discs across table top, but fewer cues needed by end of session.  Initiated theraputty exercises this date for HEP; pt required intermittent tactile cues to transfer putty to R hand, and required mod vc and tactile cues to perform exercises with proper form and technique.  Handout  issued for better carry over at home, but pt will need supv  to ensure R hand is the focus d/t R neglect.  Pt will continue to benefit from OT services to work on improving bilateral UE strength, grip strength, pinch strength, motor control, and [facility] skills, provide activities for increasing attention to R visual field, and review visual, and cognitive compensatory strategies in order to improve, and maximize independence with ADLs, and IADL tasks.   PERFORMANCE DEFICITS: in functional skills including ADLs, IADLs, coordination, dexterity, ROM, strength, Fine motor control, Gross motor control, decreased knowledge of use of DME, and UE functional use, cognitive skills including, and psychosocial skills including.   IMPAIRMENTS: are limiting patient from ADLs, IADLs, play, and leisure.   CO-MORBIDITIES: may have co-morbidities  that affects occupational performance. Patient will benefit from skilled OT to address above impairments and improve overall function.  MODIFICATION OR ASSISTANCE TO COMPLETE EVALUATION: Min-Moderate modification of tasks or assist with assess necessary to complete an evaluation.  OT OCCUPATIONAL PROFILE AND HISTORY: Detailed assessment: Review of records and additional review of physical, cognitive, psychosocial history related to current functional performance.  CLINICAL DECISION MAKING: Moderate - several treatment options, min-mod task modification necessary  REHAB POTENTIAL: Good  EVALUATION COMPLEXITY: Moderate    PLAN:  OT FREQUENCY: 2x/week  OT DURATION: 12 weeks  PLANNED INTERVENTIONS: self care/ADL training, therapeutic exercise, therapeutic activity, neuromuscular re-education, manual therapy, and functional mobility training  RECOMMENDED OTHER SERVICES:  OT and ST  CONSULTED AND AGREED WITH PLAN OF CARE: Patient  PLAN FOR NEXT SESSION: Initiate Treatment  Danelle Earthly, MS, OTR/L

## 2022-11-24 NOTE — Therapy (Signed)
OUTPATIENT PHYSICAL THERAPY NEURO TREATMENT   Patient Name: Mark Durham MRN: [id number] DOB:Nov 24, 1950, 72 y.o., male Today's Date: 11/24/2022   PCP: [facility] REFERRING PROVIDER: Hope Pigeon MD   END OF SESSION:  PT End of Session - 11/24/22 1010     Visit Number 7    Number of Visits 24    Date for PT Re-Evaluation 01/26/23    Authorization Type [employer] COMM PRO    Authorization Time Period 11/03/22-01/26/23    PT Start Time 1001    PT Stop Time 1042    PT Time Calculation (min) 41 min    Equipment Utilized During Treatment Gait belt    Activity Tolerance Patient tolerated treatment well    Behavior During Therapy WFL for tasks assessed/performed             Past Medical History:  Diagnosis Date   Hypertension    Past Surgical History:  Procedure Laterality Date   TOTAL HIP ARTHROPLASTY Right    Patient Active Problem List   Diagnosis Date Noted   GERD (gastroesophageal reflux disease) 02/26/2021   S/P hip replacement, right 03/04/2020   Primary osteoarthritis of right hip 01/24/2020   Chronic pain of right knee 11/23/2019   Chronic right hip pain 11/23/2019   Osteoarthritis of knee 11/13/2018   History of adenomatous polyp of colon 02/14/2018   History of Barrett's esophagus 02/14/2018    ONSET DATE: January 2024  REFERRING DIAG: weakness due to stroke   THERAPY DIAG:  Muscle weakness (generalized)  Difficulty in walking, not elsewhere classified  Unsteadiness on feet  Rationale for Evaluation and Treatment: Rehabilitation  SUBJECTIVE:                                                                                                                                                                                             SUBJECTIVE STATEMENT: Patient arrived early for PT treatment,b   Pt accompanied by: significant other  PERTINENT HISTORY: Patient presents to physical therapy for weakness s/p stroke . 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 Per documentation he had a secondary stroke during the hospital stay as well as three seizures. Patient was admitted to [facility] inpatient rehabilitation from 08/10/22-08/31/22 where he was discharged home. Patient was discharged from Home health on 10/28/22 (PT, OT, and ST). PMH includes GERD, OA, R knee/hip pain, Barrett's esophagus. Is using a walker, still drags right foot. Needs assistance for transfers but can bath and dress self.   PAIN:  Are you having pain? No  PRECAUTIONS: Fall  WEIGHT BEARING RESTRICTIONS:  No  FALLS: Has patient fallen in last 6 months? No  LIVING ENVIRONMENT: Lives with: lives with their family Lives in: House/apartment Stairs: Yes: External: 5 steps; on right going up and on left going up Has following equipment at home: Walker - 2 wheeled, Wheelchair (manual), Grab bars, and lifted toilet and hospital bed, shower chair   PLOF: Independent  PATIENT GOALS: walking with less AD, get back to mowing the yard, gardening, grilling   OBJECTIVE:   DIAGNOSTIC FINDINGS:  CTA of the head:   1. No emergent finding. 2. Atherosclerosis with high-grade left V4, bilateral MCA and left PCA narrowings.   Head CT:   1. No acute finding. 2. 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   CTA of the neck:   1. No emergent finding. 2. Atherosclerosis of the cervical carotids without flow reducing stenosis. There is a notable outpouching from the right ICA bulb consistent with ulcerated plaque. 3. Partially covered 8 mm nodule in the left upper lobe. There is an internal calcification favoring granulomatous disease, but partially covered. Recommend non emergent chest CT without contrast.    COGNITION: Overall cognitive status:  safety awareness, short term memory, directions, sequencing   SENSATION: WFL LEs  COORDINATION: Nose finger:  dyskinesia of RUE Heel slide: limited spatial awareness of RLE    POSTURE: rounded shoulders, posterior pelvic tilt, and weight shift left   LOWER EXTREMITY MMT:    MMT Right Eval Left Eval  Hip flexion 4 3+  Hip extension    Hip abduction 4 3-  Hip adduction 4 3-  Knee flexion 4- 3  Knee extension 4 4-  Ankle dorsiflexion 3 4  Ankle plantarflexion 3 4  Ankle inversion    Ankle eversion    (Blank rows = not tested)   TRANSFERS: Assistive device utilized: Environmental consultant - 2 wheeled  Sit to stand: CGA and Min A Stand to sit: CGA and Min A Chair to chair: Min A   GAIT: Gait pattern: step through pattern, decreased stance time- Right, and poor foot clearance- Right Distance walked: 30 ft  Assistive device utilized: Environmental consultant - 2 wheeled Level of assistance: CGA Comments: significant foot drag of RLE, forgetting of where RLE is, where it is outside of   FUNCTIONAL TESTS:  5 times sit to stand: 38.37 with UE support  10 meter walk test: 19.2 seconds with RW; dragging of R foot  Berg Balance Scale: Perform next time if possible  PATIENT SURVEYS:  FOTO 40  TODAY'S TREATMENT:                                                                                                                              DATE: 11/24/22  Neuro Re-ed:  Lora Paula level 5 x Cues for improved speed to >50SPM throughout entire reciprocal movement training, with min assist intermittently to improve amplitude of movement.   Stepping over [facility] in floor  performed with BUE support on parallel bars x 10 and then no UE support x 10. Min/CGA for safety with no UE support.   Standing on airex pad.  Normal BOS 2 x 30 sec Standing holding ball 2 x 30 sec Ball tap on parallel bars x 10 bil  Ball tap on target above eye level x 15.   Standing on level surface: reciprocal foot tap on hedgehog on 4inch step x 5 with BUE support and x 6 with no UE support.  Foot tap on 1 of 2 targets, alternating R and L lower  extremity.   Throughout session, pt performed sit<>stand transfers with supervision assist and 1 UE support to push into standing   Gait training without AD to force attention to the RLE x 128ft and 177ft with min-CGA for safety and multimodal cues for attention to task and awareness of RLE in distracting environment.      PATIENT EDUCATION: Education details: goals, POC, HEP Pt educated throughout session about proper posture and technique with exercises. Improved exercise technique, movement at target joints, use of target muscles after min to mod verbal, visual, tactile cues.  Person educated: Patient and Spouse Education method: Explanation, Demonstration, Tactile cues, and Verbal cues Education comprehension: verbalized understanding, returned demonstration, verbal cues required, and tactile cues required  HOME EXERCISE PROGRAM: Access Code: [email or web address] URL: [email or web address] Date: 11/03/2022 Prepared by: Precious Bard  Exercises - Seated March  - 1 x daily - 7 x weekly - 2 sets - 10 reps - 5 hold - Seated Long Arc Quad  - 1 x daily - 7 x weekly - 2 sets - 10 reps - 5 hold - Seated Heel Toe Raises  - 1 x daily - 7 x weekly - 2 sets - 10 reps - 5 hold - Seated Hip Abduction  - 1 x daily - 7 x weekly - 2 sets - 10 reps - 5 hold - Seated Isometric Hip Adduction with Ball  - 1 x daily - 7 x weekly - 2 sets - 10 reps - 5 hold  GOALS: Goals reviewed with patient? Yes  SHORT TERM GOALS: Target date: 12/01/2022    Patient will be independent in home exercise program to improve strength/mobility for better functional independence with ADLs. Baseline:4/24: HEP given  Goal status: INITIAL    LONG TERM GOALS: Target date: 01/26/2023    Patient will increase FOTO score to equal to or greater than  51   to demonstrate statistically significant improvement in mobility and quality of life.   Baseline: 40 Goal status: INITIAL  2.  Patient (> 34 years old) will  complete five times sit to stand test in < 15 seconds indicating an increased LE strength and improved balance. Baseline: 4/24: 38.37 with BUE support Goal status: INITIAL  3.  Patient will increase 10 meter walk test to >1.52m/s as to improve gait speed for better community ambulation and to reduce fall risk. Baseline: 4/24: 19.2 seconds with RW; dragging of R foot  Goal status: INITIAL  4.  Patient will increase Berg Balance score by > 6 points to demonstrate decreased fall risk during functional activities. Baseline: 4/24: see next session 5/2: 30/56 Goal status: INITIAL    ASSESSMENT:  CLINICAL IMPRESSION: Pt put forth good effort throughout PT treatment on this day. Instruction provided for improved balance and functional use of RUE/RLE with dynamic balance tasks and through gait training. Multiple multimodal cues from PT for attention to the RLE when fatigued and  while distracted. Pt reports moderate fatigue at end of session requiring multiple rest breaks.  Patient will benefit from continued skilled physical therapy to improve mobility, stability strength, and quality of life.   OBJECTIVE IMPAIRMENTS: Abnormal gait, cardiopulmonary status limiting activity, decreased activity tolerance, decreased balance, decreased cognition, decreased coordination, decreased endurance, decreased knowledge of condition, decreased knowledge of use of DME, decreased mobility, difficulty walking, decreased strength, decreased safety awareness, impaired perceived functional ability, impaired flexibility, impaired UE functional use, impaired vision/preception, improper body mechanics, and postural dysfunction.   ACTIVITY LIMITATIONS: carrying, lifting, bending, sitting, standing, squatting, sleeping, stairs, transfers, bed mobility, bathing, toileting, reach over head, hygiene/grooming, locomotion level, and caring for others  PARTICIPATION LIMITATIONS: meal prep, cleaning, laundry, medication management,  personal finances, interpersonal relationship, driving, shopping, community activity, and yard work  PERSONAL FACTORS: Age, Past/current experiences, Time since onset of injury/illness/exacerbation, Transportation, and 3+ comorbidities: GERD, OA, R knee/hip pain, Barrett's esophagus  are also affecting patient's functional outcome.   REHAB POTENTIAL: Good  CLINICAL DECISION MAKING: Evolving/moderate complexity  EVALUATION COMPLEXITY: Moderate  PLAN:  PT FREQUENCY: 2x/week  PT DURATION: 12 weeks  PLANNED INTERVENTIONS: Therapeutic exercises, Therapeutic activity, Neuromuscular re-education, Balance training, Gait training, Patient/Family education, Self Care, Joint mobilization, Joint manipulation, Stair training, Vestibular training, Canalith repositioning, Visual/preceptual remediation/compensation, Orthotic/Fit training, Electrical stimulation, Wheelchair mobility training, Spinal mobilization, Cryotherapy, Moist heat, Splintting, Taping, Ultrasound, Ionotophoresis 4mg /ml Dexamethasone, Manual therapy, and Re-evaluation  PLAN FOR NEXT SESSION:   Continue dynamic balance and strength training to improve attention to RLE and reduce foot drag. Add dual task challenge.  Adjust HEP as appropriate.   Grier Rocher PT, DPT  Physical Therapist - [facility]  10:49 AM 11/24/22

## 2022-11-25 NOTE — Therapy (Signed)
OUTPATIENT SPEECH LANGUAGE PATHOLOGY  EVALUATION   Patient Name: Mark Durham MRN: [id number] DOB:Jan 07, 1951, 72 y.o., male Today's Date: 11/24/2022  PCP: [facility] [place] REFERRING PROVIDER: Hope Pigeon, MD   End of Session - 11/25/22 1659     Visit Number 1    Number of Visits 25    Date for SLP Re-Evaluation 02/16/23    Authorization Type [employer] COMM PPO    Authorization Time Period 30 max combined visits    Authorization - Visit Number --    Progress Note Due on Visit 10    SLP Start Time 0800    SLP Stop Time  0900    SLP Time Calculation (min) 60 min    Activity Tolerance Patient tolerated treatment well             No past medical history on file.  The histories are not reviewed yet. Please review them in the "History" navigator section and refresh this SmartLink. Patient Active Problem List   Diagnosis Date Noted   GERD (gastroesophageal reflux disease) 02/26/2021   S/P hip replacement, right 03/04/2020   Primary osteoarthritis of right hip 01/24/2020   Chronic pain of right knee 11/23/2019   Chronic right hip pain 11/23/2019   Osteoarthritis of knee 11/13/2018   History of adenomatous polyp of colon 02/14/2018   History of Barrett's esophagus 02/14/2018    ONSET DATE: 07/15/2022; date of referral 11/03/2022   REFERRING DIAG: Z86.73 (ICD-10-CM) - Personal history of transient ischemic attack (TIA), and cerebral infarction without residual deficits R41.3 (ICD-10-CM) - Other amnesia   THERAPY DIAG:  Cognitive communication deficit  Personal history of transient ischemic attack (TIA), and cerebral infarction without residual deficits  Other amnesia  Rationale for Evaluation and Treatment Rehabilitation  SUBJECTIVE:   SUBJECTIVE STATEMENT: Pt pleasant, tendency to be distracted Pt accompanied by: significant other  PERTINENT HISTORY: Mark Durham is a 72 y.o. 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   Pt was admitted to [facility] from 08/10/2022 thru 02/20/202. Speech Therapy discharge summary states "Pt has made fair progress during AIR stay. Very limited by cognitive, visual, functional mobility deficits. Pt exhibits some insight into deficits in stating "I don't know if my wife can handle it" referring to d/c tomorrow, however grossly decreased insight into impairments e.g. states "I got it" and then not taking any action."  Pt received HHST 09/15/2022 thru 10/27/2022.   DIAGNOSTIC FINDINGS:   MRI 07/25/2022 Watershed infarct between the left MCA and PCA territories.  Atrophy of the midbrain structures is noted.   MRI 07/26/2022 Subtle new focus of restricted diffusion and subtle FLAIR signal abnormality in the left thalamus compatible with interval infarct.  Slight increase in size of the left MCA PCA infarct.   PAIN:  Are you having pain? No   FALLS: Has patient fallen in last 6 months?  See PT evaluation for details  LIVING ENVIRONMENT: Lives with: lives with their spouse Lives in: House/apartment  PLOF:  Level of assistance: Independent with ADLs, Independent with IADLs Employment: Retired   PATIENT GOALS   to improve memory  OBJECTIVE:   COGNITIVE COMMUNICATION: Overall cognitive status: Impaired Areas of impairment:  Oriented to person Oriented to place  Oriented to situation Attention: Impaired: Selective Memory: Impaired: Immediate Working Short term Prospective Awareness: Impaired: Intellectual, Emergent, and Anticipatory Impaired  AUDITORY COMPREHENSION: Overall auditory comprehension: Impaired: moderately complex YES/NO questions: Impaired: moderately complex and  complex Following directions: Impaired: moderately complex Conversation: Simple Interfering components: attention, awareness, visual impairments, and working memory Effective technique: repetition/stressing words   READING COMPREHENSION: not assessed  EXPRESSION: verbal  VERBAL EXPRESSION: Level of generative/spontaneous verbalization: phrase Automatic speech: name: intact and social response: intact  Repetition: Appears intact Naming: Confrontation: 76-100% and Divergent: 0-25% Pragmatics: Impaired: abnormal effect, eye contact, and monotone Interfering components: attention and cognitive deficits impacting memory Effective technique: open ended questions and semantic cues Non-verbal means of communication: N/A   WRITTEN EXPRESSION: Dominant hand: right   Written expression: Not tested  MOTOR SPEECH: Overall motor speech: Appears intact Level of impairment: N/A Respiration: diaphragmatic/abdominal breathing Phonation: normal Resonance: WFL Articulation: Appears intact Intelligibility: Intelligible Motor planning: Appears intact Motor speech errors: N/A Interfering components: N/A Effective technique: N/A  ORAL MOTOR EXAMINATION: Facial : WFL Lingual: WFL Velum: WFL Mandible: WFL Cough: WFL Voice: WFL   STANDARDIZED ASSESSMENTS:   Western Aphasia Battery- Revised  Spontaneous Speech                           Information content               10/10                                            Fluency                          5/10                                          Comprehension     Yes/No questions                 57/60                                           Auditory Word Recognition  51/60                                Sequential Commands       6/80                              Repetition                             90/100                                        Naming    Object Naming                     60/60  Word Fluency                        8/20                                            Sentence Completion          10/10                                             Responsive Speech              8/10                                         Aphasia Quotient                  76.6/100         Pt's severity rating was mild as indicated by an Aphasia Quotient of 76.6 (0-25=very severe, 26-50=severe, 51-75=moderate, 76 and above is mild). 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   Pt's phonemic and semantic verbal fluency was measured by the abilitiy to generate words beginning with a specific letter (e.g., FAS) and semantic category (e.g., animals). Verbal Fluency has been demonstrated to be sensitive to lesions in the frontal lobe, temporal lobe and caudate nucleus (Butters et al. Cathie Beams), Alzheimer's disease Sandria Bales, Cherlynn Perches, Eddie Candle, & [place] 1996), Huntington's disease Mary Sella et al. Cathie Beams), amnesia Mary Sella et. al 1987), and traumatic brain injury (Rasking & Rearick, 1996).   Patients were asked to generate as words as possible in 1 minute for the letters F, A, and S (phonemic fluency) with further instructions that nouns and multiple words using the same stem with a different suffix (e.g., friend, friends, friendly) were not acceptable.   When compared to peers of the same and educational level, pt's abilities were considered:   F: 4 A:1 S:1  Total Number of Words Recalled: 6;  giving patient a percentile score <10 when compared to male ages 60-79 years years with an education level of 9-12.   Animals: 8 Giving patient a percentile score of <10 when compared to male ages 33-79 years with an education level of 9-12    PATIENT REPORTED OUTCOME MEASURES (PROM):  The Communication Effectiveness Survey is a patient-reported outcome measure in which the patient rates their own effectiveness in different communication situations. A higher score indicates greater  effectiveness.   Pt's self-rating was 14/32. Patient reported difficulty across all settings.    TODAY'S TREATMENT:  N/A   PATIENT EDUCATION: Education details: results of this assessment; ST POC Person educated: Patient and Spouse Education method: Explanation Education comprehension: verbalized understanding and needs further education  HOME EXERCISE PROGRAM:        N/A    GOALS:  Goals reviewed with patient? Yes  SHORT TERM GOALS: Target date: 10 sessions  Patient will demonstrate ability to follow 2-step directions (including L vs R) with 90% accuracy given min A cues. Baseline: Goal status: INITIAL   2.  Patient will be able to employ problem solving strategies to simple daily tasks for improved overall safety to 90%  by 10/09/22 Baseline:  Goal status: INITIAL  3.  With Min A assistance, pt will use internal and external compensatory memory strategies to recall appts/daily information with 50% accuracy.  Baseline:  Goal status: INITIAL   LONG TERM GOALS: Target date: 02/16/2023  Patient will be able to employ problem solving skills to simple daily tasks for improved overall safety to 90%.  Baseline:  Goal status: INITIAL  2.  Patient will demonstrate improved awareness and safety by accurately problem solving scenarios form daily living in 5 out of 7 opportunities.  Baseline:  Goal status: INITIAL   ASSESSMENT:  CLINICAL IMPRESSION:  Patient is a 72 y.o. male who was seen today for a cognitive communication evaluation. Pt presents with moderate transcortical aphasia that is further complicated by visual field deficits and short memory impairments.    OBJECTIVE IMPAIRMENTS include attention, memory, awareness, executive functioning, expressive language, receptive language, and aphasia. These impairments are limiting patient from managing medications, managing appointments, managing finances, ADLs/IADLs, and effectively communicating at home and in  community. Factors affecting potential to achieve goals and functional outcome are ability to learn/carryover information, co-morbidities, medical prognosis, and severity of impairments.. Patient will benefit from skilled SLP services to address above impairments and improve overall function.  REHAB POTENTIAL: Good  PLAN: SLP FREQUENCY: 1-2x/week  SLP DURATION: 12 weeks  PLANNED INTERVENTIONS: Language facilitation, Cueing hierachy, Cognitive reorganization, Internal/external aids, Functional tasks, SLP instruction and feedback, Compensatory strategies, and Patient/family education    Gust Eugene B. Dreama Saa, M.S., CCC-SLP, Tree surgeon Certified Brain Injury Specialist [facility]  [facility] Rehabilitation Services Office [phone] Ascom [phone] Fax [phone]

## 2022-11-29 ENCOUNTER — Ambulatory Visit: Payer: BC Managed Care – PPO | Admitting: Speech Pathology

## 2022-11-29 ENCOUNTER — Ambulatory Visit: Payer: BC Managed Care – PPO | Admitting: Physical Therapy

## 2022-11-29 DIAGNOSIS — M6281 Muscle weakness (generalized): Secondary | ICD-10-CM

## 2022-11-29 DIAGNOSIS — R2681 Unsteadiness on feet: Secondary | ICD-10-CM

## 2022-11-29 DIAGNOSIS — R4701 Aphasia: Secondary | ICD-10-CM

## 2022-11-29 DIAGNOSIS — R41841 Cognitive communication deficit: Secondary | ICD-10-CM

## 2022-11-29 DIAGNOSIS — R413 Other amnesia: Secondary | ICD-10-CM

## 2022-11-29 DIAGNOSIS — R278 Other lack of coordination: Secondary | ICD-10-CM

## 2022-11-29 DIAGNOSIS — Z8673 Personal history of transient ischemic attack (TIA), and cerebral infarction without residual deficits: Secondary | ICD-10-CM

## 2022-11-29 DIAGNOSIS — R262 Difficulty in walking, not elsewhere classified: Secondary | ICD-10-CM

## 2022-11-29 NOTE — Therapy (Signed)
OUTPATIENT PHYSICAL THERAPY NEURO TREATMENT   Patient Name: Mark Durham MRN: [id number] DOB:09/23/1950, 72 y.o., male Today's Date: 11/29/2022   PCP: [facility] REFERRING PROVIDER: Hope Pigeon MD   END OF SESSION:  PT End of Session - 11/29/22 1023     Visit Number 8    Number of Visits 24    Date for PT Re-Evaluation 01/26/23    Authorization Type [employer] COMM PRO    Authorization Time Period 11/03/22-01/26/23    PT Start Time 1020    PT Stop Time 1100    PT Time Calculation (min) 40 min    Equipment Utilized During Treatment Gait belt    Activity Tolerance Patient tolerated treatment well    Behavior During Therapy WFL for tasks assessed/performed             Past Medical History:  Diagnosis Date   Hypertension    Past Surgical History:  Procedure Laterality Date   TOTAL HIP ARTHROPLASTY Right    Patient Active Problem List   Diagnosis Date Noted   GERD (gastroesophageal reflux disease) 02/26/2021   S/P hip replacement, right 03/04/2020   Primary osteoarthritis of right hip 01/24/2020   Chronic pain of right knee 11/23/2019   Chronic right hip pain 11/23/2019   Osteoarthritis of knee 11/13/2018   History of adenomatous polyp of colon 02/14/2018   History of Barrett's esophagus 02/14/2018    ONSET DATE: January 2024  REFERRING DIAG: weakness due to stroke   THERAPY DIAG:  Other lack of coordination  Muscle weakness (generalized)  Difficulty in walking, not elsewhere classified  Unsteadiness on feet  Rationale for Evaluation and Treatment: Rehabilitation  SUBJECTIVE:                                                                                                                                                                                             SUBJECTIVE STATEMENT: Patient arrived early for PT treatment,b   Pt accompanied by: significant other  PERTINENT HISTORY: Patient presents to physical therapy for weakness s/p  stroke . 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 Per documentation he had a secondary stroke during the hospital stay as well as three seizures. Patient was admitted to [facility] inpatient rehabilitation from 08/10/22-08/31/22 where he was discharged home. Patient was discharged from Home health on 10/28/22 (PT, OT, and ST). PMH includes GERD, OA, R knee/hip pain, Barrett's esophagus. Is using a walker, still drags right foot. Needs assistance for transfers but can bath and dress self.   PAIN:  Are you having pain? No  PRECAUTIONS:  Fall  WEIGHT BEARING RESTRICTIONS: No  FALLS: Has patient fallen in last 6 months? No  LIVING ENVIRONMENT: Lives with: lives with their family Lives in: House/apartment Stairs: Yes: External: 5 steps; on right going up and on left going up Has following equipment at home: Walker - 2 wheeled, Wheelchair (manual), Grab bars, and lifted toilet and hospital bed, shower chair   PLOF: Independent  PATIENT GOALS: walking with less AD, get back to mowing the yard, gardening, grilling   OBJECTIVE:   DIAGNOSTIC FINDINGS:  CTA of the head:   1. No emergent finding. 2. Atherosclerosis with high-grade left V4, bilateral MCA and left PCA narrowings.   Head CT:   1. No acute finding. 2. 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   CTA of the neck:   1. No emergent finding. 2. Atherosclerosis of the cervical carotids without flow reducing stenosis. There is a notable outpouching from the right ICA bulb consistent with ulcerated plaque. 3. Partially covered 8 mm nodule in the left upper lobe. There is an internal calcification favoring granulomatous disease, but partially covered. Recommend non emergent chest CT without contrast.    COGNITION: Overall cognitive status:  safety awareness, short term memory, directions, sequencing   SENSATION: WFL  LEs  COORDINATION: Nose finger: dyskinesia of RUE Heel slide: limited spatial awareness of RLE    POSTURE: rounded shoulders, posterior pelvic tilt, and weight shift left   LOWER EXTREMITY MMT:    MMT Right Eval Left Eval  Hip flexion 4 3+  Hip extension    Hip abduction 4 3-  Hip adduction 4 3-  Knee flexion 4- 3  Knee extension 4 4-  Ankle dorsiflexion 3 4  Ankle plantarflexion 3 4  Ankle inversion    Ankle eversion    (Blank rows = not tested)   TRANSFERS: Assistive device utilized: Environmental consultant - 2 wheeled  Sit to stand: CGA and Min A Stand to sit: CGA and Min A Chair to chair: Min A   GAIT: Gait pattern: step through pattern, decreased stance time- Right, and poor foot clearance- Right Distance walked: 30 ft  Assistive device utilized: Environmental consultant - 2 wheeled Level of assistance: CGA Comments: significant foot drag of RLE, forgetting of where RLE is, where it is outside of   FUNCTIONAL TESTS:  5 times sit to stand: 38.37 with UE support  10 meter walk test: 19.2 seconds with RW; dragging of R foot  Berg Balance Scale: Perform next time if possible  PATIENT SURVEYS:  FOTO 40  TODAY'S TREATMENT:                                                                                                                              DATE: 11/29/22   Gait training without AD to force attention to the RLE x 2 x74ft and 115ft with min-CGA for safety and multimodal cues for attention to task and awareness of  RLE in distracting environment.    Foot tap on 6inch step. X 10 bil with BUE support  Standing with 1 foot on 6 inch step 2 x 30 sec bil no UE support  Dynamic gait training to navigate obstacles in floor ( 2 canes and half bolster) performed 2 x 4.   Sit<>stand 2x 6 pushing from thighs.  Foot tap on 4inch step with no UE support x 8 bil  Cues from PT for improved attention to the RLE and increased step length to reduce fall risk in stimulating environment.    PATIENT  EDUCATION: Education details: goals, POC, HEP Pt educated throughout session about proper posture and technique with exercises. Improved exercise technique, movement at target joints, use of target muscles after min to mod verbal, visual, tactile cues.  Person educated: Patient and Spouse Education method: Explanation, Demonstration, Tactile cues, and Verbal cues Education comprehension: verbalized understanding, returned demonstration, verbal cues required, and tactile cues required  HOME EXERCISE PROGRAM: Access Code: [email or web address] URL: [email or web address] Date: 11/03/2022 Prepared by: Precious Bard  Exercises - Seated March  - 1 x daily - 7 x weekly - 2 sets - 10 reps - 5 hold - Seated Long Arc Quad  - 1 x daily - 7 x weekly - 2 sets - 10 reps - 5 hold - Seated Heel Toe Raises  - 1 x daily - 7 x weekly - 2 sets - 10 reps - 5 hold - Seated Hip Abduction  - 1 x daily - 7 x weekly - 2 sets - 10 reps - 5 hold - Seated Isometric Hip Adduction with Ball  - 1 x daily - 7 x weekly - 2 sets - 10 reps - 5 hold  GOALS: Goals reviewed with patient? Yes  SHORT TERM GOALS: Target date: 12/01/2022    Patient will be independent in home exercise program to improve strength/mobility for better functional independence with ADLs. Baseline:4/24: HEP given  Goal status: INITIAL    LONG TERM GOALS: Target date: 01/26/2023    Patient will increase FOTO score to equal to or greater than  51   to demonstrate statistically significant improvement in mobility and quality of life.   Baseline: 40 Goal status: INITIAL  2.  Patient (> 55 years old) will complete five times sit to stand test in < 15 seconds indicating an increased LE strength and improved balance. Baseline: 4/24: 38.37 with BUE support Goal status: INITIAL  3.  Patient will increase 10 meter walk test to >1.30m/s as to improve gait speed for better community ambulation and to reduce fall risk. Baseline: 4/24: 19.2 seconds  with RW; dragging of R foot  Goal status: INITIAL  4.  Patient will increase Berg Balance score by > 6 points to demonstrate decreased fall risk during functional activities. Baseline: 4/24: see next session 5/2: 30/56 Goal status: INITIAL    ASSESSMENT:  CLINICAL IMPRESSION:  Pt put forth good effort throughout PT treatment on this day. Moderate cues for improved attention to the RLE to improve step length when distracted. Noted to have increased step length on the RLE when stepping over obstacles, compared to no visual target through rehab gym. Patient will benefit from continued skilled physical therapy to improve mobility, stability strength, and quality of life.   OBJECTIVE IMPAIRMENTS: Abnormal gait, cardiopulmonary status limiting activity, decreased activity tolerance, decreased balance, decreased cognition, decreased coordination, decreased endurance, decreased knowledge of condition, decreased knowledge of use of DME, decreased mobility, difficulty walking, decreased strength, decreased  safety awareness, impaired perceived functional ability, impaired flexibility, impaired UE functional use, impaired vision/preception, improper body mechanics, and postural dysfunction.   ACTIVITY LIMITATIONS: carrying, lifting, bending, sitting, standing, squatting, sleeping, stairs, transfers, bed mobility, bathing, toileting, reach over head, hygiene/grooming, locomotion level, and caring for others  PARTICIPATION LIMITATIONS: meal prep, cleaning, laundry, medication management, personal finances, interpersonal relationship, driving, shopping, community activity, and yard work  PERSONAL FACTORS: Age, Past/current experiences, Time since onset of injury/illness/exacerbation, Transportation, and 3+ comorbidities: GERD, OA, R knee/hip pain, Barrett's esophagus  are also affecting patient's functional outcome.   REHAB POTENTIAL: Good  CLINICAL DECISION MAKING: Evolving/moderate  complexity  EVALUATION COMPLEXITY: Moderate  PLAN:  PT FREQUENCY: 2x/week  PT DURATION: 12 weeks  PLANNED INTERVENTIONS: Therapeutic exercises, Therapeutic activity, Neuromuscular re-education, Balance training, Gait training, Patient/Family education, Self Care, Joint mobilization, Joint manipulation, Stair training, Vestibular training, Canalith repositioning, Visual/preceptual remediation/compensation, Orthotic/Fit training, Electrical stimulation, Wheelchair mobility training, Spinal mobilization, Cryotherapy, Moist heat, Splintting, Taping, Ultrasound, Ionotophoresis 4mg /ml Dexamethasone, Manual therapy, and Re-evaluation  PLAN FOR NEXT SESSION:   Continue dynamic balance and strength training to improve attention to RLE and reduce foot drag.  Add dual task challenge.  Adjust HEP as appropriate.   Grier Rocher PT, DPT  Physical Therapist - [place]  [facility]  2:20 PM 11/29/22

## 2022-11-29 NOTE — Therapy (Signed)
OUTPATIENT SPEECH LANGUAGE PATHOLOGY  TREATMENT NOTE   Patient Name: Mark Durham MRN: [id number] DOB:12-Aug-1950, 72 y.o., male Today's Date: 11/29/2022   PCP: [facility] [place] REFERRING PROVIDER: Hope Pigeon, MD   End of Session - 11/29/22 1101     Visit Number 2    Number of Visits 25    Date for SLP Re-Evaluation 02/16/23    Authorization Type [employer] COMM PPO    Authorization Time Period 30 max combined visits    Progress Note Due on Visit 10    SLP Start Time 1100    SLP Stop Time  1200    SLP Time Calculation (min) 60 min    Activity Tolerance Patient tolerated treatment well             No past medical history on file.  The histories are not reviewed yet. Please review them in the "History" navigator section and refresh this SmartLink. Patient Active Problem List   Diagnosis Date Noted   GERD (gastroesophageal reflux disease) 02/26/2021   S/P hip replacement, right 03/04/2020   Primary osteoarthritis of right hip 01/24/2020   Chronic pain of right knee 11/23/2019   Chronic right hip pain 11/23/2019   Osteoarthritis of knee 11/13/2018   History of adenomatous polyp of colon 02/14/2018   History of Barrett's esophagus 02/14/2018    ONSET DATE: 07/15/2022; date of referral 11/03/2022   REFERRING DIAG: Z86.73 (ICD-10-CM) - Personal history of transient ischemic attack (TIA), and cerebral infarction without residual deficits R41.3 (ICD-10-CM) - Other amnesia   THERAPY DIAG:  Cognitive communication deficit  Aphasia  Personal history of transient ischemic attack (TIA), and cerebral infarction without residual deficits  Other amnesia  Rationale for Evaluation and Treatment Rehabilitation  SUBJECTIVE:   SUBJECTIVE STATEMENT: Pt pleasant, tendency to be distracted Pt accompanied by: significant other  PERTINENT HISTORY: Mark Durham is a 72 y.o. 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   Pt was admitted to [facility] from 08/10/2022 thru 02/20/202. Speech Therapy discharge summary states "Pt has made fair progress during AIR stay. Very limited by cognitive, visual, functional mobility deficits. Pt exhibits some insight into deficits in stating "I don't know if my wife can handle it" referring to d/c tomorrow, however grossly decreased insight into impairments e.g. states "I got it" and then not taking any action."  Pt received HHST 09/15/2022 thru 10/27/2022.   DIAGNOSTIC FINDINGS:   MRI 07/25/2022 Watershed infarct between the left MCA and PCA territories.  Atrophy of the midbrain structures is noted.   MRI 07/26/2022 Subtle new focus of restricted diffusion and subtle FLAIR signal abnormality in the left thalamus compatible with interval infarct.  Slight increase in size of the left MCA PCA infarct.   PAIN:  Are you having pain? No   FALLS: Has patient fallen in last 6 months?  See PT evaluation for details  LIVING ENVIRONMENT: Lives with: lives with their spouse Lives in: House/apartment  PLOF:  Level of assistance: Independent with ADLs, Independent with IADLs Employment: Retired   PATIENT GOALS   to improve memory "I would like to be able to pay my own bills"  OBJECTIVE:   TODAY'S TREATMENT:  Skilled treatment session focused on pt's  cognitive communication goals. SLP facilitated session by providing the following interventions:  Use of external memory aids:  Appointments: pt's wife reports that he has calendar at home but is uncertain if pt can read the information listed on the calendar. Instructions provided to ask questions about activities and bring calendar into next session.   Medications: SLP made list of current medicines;  Basic problem solving: medication management task - maximal multimodal assistance required to effectively discern AM vs PM; organize pill box as pt sat beads on top of organize requiring cues to open the different compartments,  Basic math - maximal assistance to achieve 50% accuracy    PATIENT EDUCATION: Education details: see above Person educated: Patient and Spouse Education method: Explanation Education comprehension: verbalized understanding and needs further education  HOME EXERCISE PROGRAM:        Practice using calendar  Using play money or real money to practice basic counting and making change    GOALS:  Goals reviewed with patient? Yes  SHORT TERM GOALS: Target date: 10 sessions  Patient will demonstrate ability to follow 2-step directions (including L vs R) with 90% accuracy given min A cues. Baseline: Goal status: INITIAL   2.  Patient will be able to employ problem solving strategies to simple daily tasks for improved overall safety to 90% by 10/09/22 Baseline:  Goal status: INITIAL  3.  With Min A assistance, pt will use internal and external compensatory memory strategies to recall appts/daily information with 50% accuracy.  Baseline:  Goal status: INITIAL   LONG TERM GOALS: Target date: 02/16/2023  Patient will be able to employ problem solving skills to simple daily tasks for improved overall safety to 90%.  Baseline:  Goal status: INITIAL  2.  Patient will demonstrate improved awareness and safety by accurately problem solving scenarios form daily living in 5 out of 7 opportunities.  Baseline:  Goal status: INITIAL   ASSESSMENT:  CLINICAL IMPRESSION: Pt presents with moderate cognitive communication impairments impacting his ability to complete basic money and medication tasks.    OBJECTIVE IMPAIRMENTS include attention, memory, awareness, executive functioning, expressive language, receptive  language, and aphasia. These impairments are limiting patient from managing medications, managing appointments, managing finances, ADLs/IADLs, and effectively communicating at home and in community. Factors affecting potential to achieve goals and functional outcome are ability to learn/carryover information, co-morbidities, medical prognosis, and severity of impairments.. Patient will benefit from skilled SLP services to address above impairments and improve overall function.  REHAB POTENTIAL: Good  PLAN: SLP FREQUENCY: 1-2x/week  SLP DURATION: 12 weeks  PLANNED INTERVENTIONS: Language facilitation, Cueing hierachy, Cognitive reorganization, Internal/external aids, Functional tasks, SLP instruction and feedback, Compensatory strategies, and Patient/family education    Vernie Piet B. Dreama Saa, M.S., CCC-SLP, Tree surgeon Certified Brain Injury Specialist [facility]  [facility] Office [phone] Ascom [phone] Fax [phone]

## 2022-12-01 ENCOUNTER — Ambulatory Visit: Payer: BC Managed Care – PPO | Admitting: Occupational Therapy

## 2022-12-01 ENCOUNTER — Ambulatory Visit: Payer: BC Managed Care – PPO | Admitting: Speech Pathology

## 2022-12-01 DIAGNOSIS — M6281 Muscle weakness (generalized): Secondary | ICD-10-CM

## 2022-12-01 DIAGNOSIS — R278 Other lack of coordination: Secondary | ICD-10-CM

## 2022-12-01 DIAGNOSIS — Z8673 Personal history of transient ischemic attack (TIA), and cerebral infarction without residual deficits: Secondary | ICD-10-CM

## 2022-12-01 DIAGNOSIS — R41841 Cognitive communication deficit: Secondary | ICD-10-CM

## 2022-12-01 DIAGNOSIS — R262 Difficulty in walking, not elsewhere classified: Secondary | ICD-10-CM | POA: Diagnosis not present

## 2022-12-01 NOTE — Therapy (Signed)
OUTPATIENT OCCUPATIONAL THERAPY NEURO TREATMENT NOTE  Patient Name: Mark Durham MRN: [id number] DOB:Aug 23, 1950, 72 y.o., male Today's Date: 12/01/2022  PCP: [facility] REFERRING PROVIDER: Hope Pigeon, MD  END OF SESSION:  OT End of Session - 12/01/22 1219     Visit Number 3    Number of Visits 24    Date for OT Re-Evaluation 02/07/23    OT Start Time 0915    OT Stop Time 1000    OT Time Calculation (min) 45 min    Activity Tolerance Patient tolerated treatment well    Behavior During Therapy [facility] for tasks assessed/performed            Past Medical History:  Diagnosis Date   Hypertension    Past Surgical History:  Procedure Laterality Date   TOTAL HIP ARTHROPLASTY Right    Patient Active Problem List   Diagnosis Date Noted   GERD (gastroesophageal reflux disease) 02/26/2021   S/P hip replacement, right 03/04/2020   Primary osteoarthritis of right hip 01/24/2020   Chronic pain of right knee 11/23/2019   Chronic right hip pain 11/23/2019   Osteoarthritis of knee 11/13/2018   History of adenomatous polyp of colon 02/14/2018   History of Barrett's esophagus 02/14/2018   ONSET DATE:  07/25/2022  REFERRING DIAG:  CVA  THERAPY DIAG:  Muscle weakness (generalized)  Other lack of coordination  Rationale for Evaluation and Treatment: Rehabilitation  SUBJECTIVE:  SUBJECTIVE STATEMENT:  Pt. Reports doing well today.   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 Per chart review, Pt. had a secondary stroke during the hospital stay as well as three seizures. Patient was admitted to [facility] Acute inpatient rehabilitation from 08/10/22-08/31/22 where he was discharged home. Patient was discharged from Home health on 10/28/22 (PT, OT, and ST). PMH includes: GERD, OA, R knee/hip pain, Barrett's esophagus.  PRECAUTIONS: None  WEIGHT BEARING RESTRICTIONS:  No  PAIN:  Are you having pain? No  FALLS: Has patient fallen in last 6 months? No  LIVING ENVIRONMENT: Lives with: lives with their spouse Lives in: House/apartment Stairs: 5 steps to enter; one level home Has following equipment at home: Environmental consultant - 2 wheeled, Wheelchair (manual), shower chair, and bed side commode  PLOF: Independent  PATIENT GOALS: To get to walking  OBJECTIVE:   HAND DOMINANCE: Right  ADLs: Transfers/ambulation related to ADLs: Eating: Independent with utensils, assist with cutting food. Grooming: Independent oral care, wife assists with shaving. UB Dressing: Independent LB Dressing: independent Toileting: Independent Bathing: Once in the shower Supervision with bathing Tub Shower transfers: CGA with showere chair   IADLs: Shopping: Wife performs Light housekeeping: Wife performs most IADLs Meal Prep:  Meal preparation Community mobility: Relies on family and friends for driving. Medication management: Wife sets up, and provides medication Financial management: Wife completes Handwriting:  Pt. Wife reports Difficulty with identifying letters, and numbers   MOBILITY STATUS: Uses a rolling walker  POSTURE COMMENTS:  No Significant postural limitations  ACTIVITY TOLERANCE: Activity tolerance:  Fair  FUNCTIONAL OUTCOME MEASURES: FOTO: 53  TR score: 55  UPPER EXTREMITY ROM:    Active ROM Right Eval WFL Left Eval [facility]  Shoulder flexion    Shoulder abduction    Shoulder adduction    Shoulder extension    Shoulder internal rotation    Shoulder external rotation    Elbow flexion    Elbow extension  Wrist flexion    Wrist extension    Wrist ulnar deviation    Wrist radial deviation    Wrist pronation    Wrist supination    (Blank rows = not tested)  UPPER EXTREMITY MMT:     MMT Right Eval 4/5 Left Eval 4+/5  Shoulder flexion 4-/5 4+/5  Shoulder abduction    Shoulder adduction    Shoulder extension    Shoulder internal  rotation    Shoulder external rotation    Middle trapezius    Lower trapezius    Elbow flexion 4+/5 5/5  Elbow extension 4+/5 5/5  Wrist flexion    Wrist extension 4/5 4+/5  Wrist ulnar deviation    Wrist radial deviation    Wrist pronation    Wrist supination    (Blank rows = not tested)  HAND FUNCTION: Grip strength: Right: 35 lbs; Left: 25 lbs, Lateral pinch: Right: 13 lbs, Left: 11 lbs, and 3 point pinch: Right: 11 lbs, Left: Pt. Unable to assume 3pt. Pinch position  COORDINATION: 9 Hole Peg test: Right: placement/removal of 5 pegs in 1 min. & 5 sec; Left: 50 sec Pt. Has difficulty seeing items in the dish on the right side. Pt. Kept moving the vertical pegs between holes with the right  SENSATION: Light touch: WFL Proprioception: Impaired   EDEMA: Intact   MUSCLE TONE: RUE: Mild flexor tone. Wife reports right hand presents with flexion in the right hand  COGNITION: Overall cognitive status:  Pt.'s wife reports noticing the Pt. has difficulty with numbers, and letters.  VISION:    Wife reports the Pt. frequently misses items on the right, and bumping into obstacle on the right   VISION ASSESSMENT: To be further assessed in functional context  11/24/22: impaired smooth pursuits all quadrants, requiring additional head turns; impaired saccades (slow pace, requiring head turns).  Visual fields appear grossly intact, but difficult to assess d/t decreased attention and ability to consistently follow a 2 step command. Moderate-severe R sided inattention noted.  Pt. Has a neuro optometrist appointment scheduled for June.   PERCEPTION:  TBD  PRAXIS: Impaired: Motor planning  TODAY'S TREATMENT:                                                                                                                              DATE: 12/01/2022    Therapeutic Activities:  Pt. worked on visual scanning activities using a  simple single letter search, and random complex circles search.  Pt. required increased time to complete each scanning task.  Pt. worked on Therapist, sports patterns using the [employer] requiring step-by-step visual, and verbal cues.   PATIENT EDUCATION: Education details: Increasing R sided visual attention: family to sit to the R of patient at kitchen table; place ADL supplies to pt's R for set up and provide reminders as needed for head turns (ie remote control, cup, toothbrush) Person educated: Patient, spouse Education  method: explanation, vc Education comprehension: verbalized understanding and returned demonstration  HOME EXERCISE PROGRAM: Turquoise theraputty; recommended solitaire for increasing R sided visual attention  GOALS: Goals reviewed with patient?  Yes   SHORT TERM GOALS: Target date: 12/27/2022  Pt. Will require Supervision for bilateral UE HEPs.  Baseline: Eval: No current HEP  Goal status: INITIAL  LONG TERM GOALS: Target date: 02/07/2023    Pt. Will increase FOTO score by 2 points for Pt. perceived improvement with assessment specific ADLs, and IADLs  Baseline: Eval:  FOTO score: 53 TR score: 55 Goal status: INITIAL  2.  Pt. Will increase bilateral UE strength by 2 mm grades overall to assist with ADLs, and IADLs Baseline: Eval: Right:Shoulder flexion 4/5, abduction 4-/5, elbow flexion 4+/5, extension 4+/5, wrist extension 4/5 Left Shoulder flexion 4+/5, abduction 4+/5, elbow flexion 5/5, extension 5/5, wrist extension: 4+/5 Goal status: INITIAL  3.  Pt. Will improve bilateral hand [facility] skills by 10 sec. In order to be able to manipulate objects during ADLs, and IADLs Baseline: Eval: Right: Pt. Placed/removed 5 pegs in 1 min, & 5 sec. Pt. Kept moving the vertical pegs between holes with the right.  Left: 50 sec.  Goal status: INITIAL  4.  Pt. Will consistently implements visual compensatory strategies for ADLs, and IADLs Baseline: Eval: Pt. Currently does not utilize. Education to be  provided. Goal status: INITIAL  5. Pt. Will improve bilateral grip strength to be able to securely hold items for ADLs, and IADLs. [place]: Right: 35#, Left: 25# Goal status: INITIAL 6. Pt. Will improve bilateral lateral pinch strength to be able to securely hold items for ADLs, and IADLs. Baselne: Right: 13# , Left: 11# Goal status: INITIAL  ASSESSMENT:  CLINICAL IMPRESSION:  Pt. required increased time to complete visual scanning tasks with 6 omissions on the single letter search with 2 on the left, and 4 to the right side of the page. Pt. omitted 1 to the right side of the page on the random complex circles search. Pt. required fewer verbal, and visual cues when following the peg design from left to right, however had increased difficulty working in the reverse.Pt. required cues to place the container on the right side of the board, as Pt. frequently attempts to place the container to the left. Pt. continues to benefit from OT services to work on improving bilateral UE strength, grip strength, pinch strength, motor control, and [facility] skills, provide activities for increasing attention to R visual field, and review visual, and cognitive compensatory strategies in order to improve, and maximize independence with ADLs, and IADL tasks.   PERFORMANCE DEFICITS: in functional skills including ADLs, IADLs, coordination, dexterity, ROM, strength, Fine motor control, Gross motor control, decreased knowledge of use of DME, and UE functional use, cognitive skills including, and psychosocial skills including.   IMPAIRMENTS: are limiting patient from ADLs, IADLs, play, and leisure.   CO-MORBIDITIES: may have co-morbidities  that affects occupational performance. Patient will benefit from skilled OT to address above impairments and improve overall function.  MODIFICATION OR ASSISTANCE TO COMPLETE EVALUATION: Min-Moderate modification of tasks or assist with assess necessary to complete an evaluation.  OT  OCCUPATIONAL PROFILE AND HISTORY: Detailed assessment: Review of records and additional review of physical, cognitive, psychosocial history related to current functional performance.  CLINICAL DECISION MAKING: Moderate - several treatment options, min-mod task modification necessary  REHAB POTENTIAL: Good  EVALUATION COMPLEXITY: Moderate    PLAN:  OT FREQUENCY: 2x/week  OT DURATION: 12 weeks  PLANNED  INTERVENTIONS: self care/ADL training, therapeutic exercise, therapeutic activity, neuromuscular re-education, manual therapy, and functional mobility training  RECOMMENDED OTHER SERVICES:  OT and ST  CONSULTED AND AGREED WITH PLAN OF CARE: Patient  PLAN FOR NEXT SESSION: Initiate Treatment  Olegario Messier, MS, OTR/L  12/01/2022 12:30pm

## 2022-12-01 NOTE — Therapy (Signed)
OUTPATIENT SPEECH LANGUAGE PATHOLOGY  TREATMENT NOTE   Patient Name: Mark Durham MRN: [id number] DOB:May 30, 1951, 72 y.o., male Today's Date: 12/01/2022   PCP: [facility] [place] REFERRING PROVIDER: Hope Pigeon, MD   End of Session - 12/01/22 0802     Visit Number 3    Number of Visits 25    Date for SLP Re-Evaluation 02/16/23    Authorization Type [employer] COMM PPO    Authorization Time Period 30 max combined visits    Progress Note Due on Visit 10    SLP Start Time 0800    SLP Stop Time  0900    SLP Time Calculation (min) 60 min    Activity Tolerance Patient tolerated treatment well             No past medical history on file.  The histories are not reviewed yet. Please review them in the "History" navigator section and refresh this SmartLink. Patient Active Problem List   Diagnosis Date Noted   GERD (gastroesophageal reflux disease) 02/26/2021   S/P hip replacement, right 03/04/2020   Primary osteoarthritis of right hip 01/24/2020   Chronic pain of right knee 11/23/2019   Chronic right hip pain 11/23/2019   Osteoarthritis of knee 11/13/2018   History of adenomatous polyp of colon 02/14/2018   History of Barrett's esophagus 02/14/2018    ONSET DATE: 07/15/2022; date of referral 11/03/2022   REFERRING DIAG: Z86.73 (ICD-10-CM) - Personal history of transient ischemic attack (TIA), and cerebral infarction without residual deficits R41.3 (ICD-10-CM) - Other amnesia   THERAPY DIAG:  Cognitive communication deficit  Personal history of transient ischemic attack (TIA), and cerebral infarction without residual deficits  Rationale for Evaluation and Treatment Rehabilitation  SUBJECTIVE:   SUBJECTIVE STATEMENT: Pt pleasant, tendency to be distracted Pt accompanied by: significant other  PERTINENT HISTORY: Mark Durham is a 72 y.o. 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   Pt was admitted to [facility] from 08/10/2022 thru 02/20/202. Speech Therapy discharge summary states "Pt has made fair progress during AIR stay. Very limited by cognitive, visual, functional mobility deficits. Pt exhibits some insight into deficits in stating "I don't know if my wife can handle it" referring to d/c tomorrow, however grossly decreased insight into impairments e.g. states "I got it" and then not taking any action."  Pt received HHST 09/15/2022 thru 10/27/2022.   DIAGNOSTIC FINDINGS:   MRI 07/25/2022 Watershed infarct between the left MCA and PCA territories.  Atrophy of the midbrain structures is noted.   MRI 07/26/2022 Subtle new focus of restricted diffusion and subtle FLAIR signal abnormality in the left thalamus compatible with interval infarct.  Slight increase in size of the left MCA PCA infarct.   PAIN:  Are you having pain? No   FALLS: Has patient fallen in last 6 months?  See PT evaluation for details  LIVING ENVIRONMENT: Lives with: lives with their spouse Lives in: House/apartment  PLOF:  Level of assistance: Independent with ADLs, Independent with IADLs Employment: Retired   PATIENT GOALS   to improve memory "I would like to be able to pay my own bills"  OBJECTIVE:   TODAY'S TREATMENT:  Skilled treatment session focused on pt's cognitive communication goals. SLP facilitated  session by providing the following interventions:  Use of external memory aids:  Appointments: forgot to bring in current calendar will assess functionality during next session Medications: pt's wife referring to list and directions to organize pt's 0930 medicines Basic problem solving: medication management task - improved ability minimal  assistance required to effectively discern AM vs PM; once daily vs BID; pt required 45 minutes to organize total of 4 basic medicines Improved selective attention for basic simple sorting task with no re-direction required for 15 minute interval    PATIENT EDUCATION: Education details: see above Person educated: Patient and Spouse Education method: Explanation Education comprehension: verbalized understanding and needs further education  HOME EXERCISE PROGRAM:        Practice using calendar  Using play money or real money to practice basic counting and making change    GOALS:  Goals reviewed with patient? Yes  SHORT TERM GOALS: Target date: 10 sessions  Patient will demonstrate ability to follow 2-step directions (including L vs R) with 90% accuracy given min A cues. Baseline: Goal status: INITIAL   2.  Patient will be able to employ problem solving strategies to simple daily tasks for improved overall safety to 90% by 10/09/22 Baseline:  Goal status: INITIAL  3.  With Min A assistance, pt will use internal and external compensatory memory strategies to recall appts/daily information with 50% accuracy.  Baseline:  Goal status: INITIAL   LONG TERM GOALS: Target date: 02/16/2023  Patient will be able to employ problem solving skills to simple daily tasks for improved overall safety to 90%.  Baseline:  Goal status: INITIAL  2.  Patient will demonstrate improved awareness and safety by accurately problem solving scenarios form daily living in 5 out of 7 opportunities.  Baseline:  Goal status: INITIAL   ASSESSMENT:  CLINICAL IMPRESSION: Pt presents with moderate cognitive communication impairments impacting his ability to complete basic money and medication tasks.    OBJECTIVE IMPAIRMENTS include attention, memory, awareness, executive functioning, expressive language, receptive language, and aphasia. These impairments are limiting patient from managing medications,  managing appointments, managing finances, ADLs/IADLs, and effectively communicating at home and in community. Factors affecting potential to achieve goals and functional outcome are ability to learn/carryover information, co-morbidities, medical prognosis, and severity of impairments.. Patient will benefit from skilled SLP services to address above impairments and improve overall function.  REHAB POTENTIAL: Good  PLAN: SLP FREQUENCY: 1-2x/week  SLP DURATION: 12 weeks  PLANNED INTERVENTIONS: Language facilitation, Cueing hierachy, Cognitive reorganization, Internal/external aids, Functional tasks, SLP instruction and feedback, Compensatory strategies, and Patient/family education    Daking Westervelt B. Dreama Saa, M.S., CCC-SLP, Tree surgeon Certified Brain Injury Specialist [facility]  [facility] Rehabilitation Services Office [phone] Ascom [phone] Fax [phone]

## 2022-12-02 ENCOUNTER — Ambulatory Visit: Payer: BC Managed Care – PPO | Admitting: Physical Therapy

## 2022-12-07 ENCOUNTER — Ambulatory Visit: Payer: BC Managed Care – PPO | Admitting: Speech Pathology

## 2022-12-07 DIAGNOSIS — Z8673 Personal history of transient ischemic attack (TIA), and cerebral infarction without residual deficits: Secondary | ICD-10-CM

## 2022-12-07 DIAGNOSIS — R41841 Cognitive communication deficit: Secondary | ICD-10-CM

## 2022-12-07 DIAGNOSIS — R262 Difficulty in walking, not elsewhere classified: Secondary | ICD-10-CM | POA: Diagnosis not present

## 2022-12-07 NOTE — Therapy (Signed)
OUTPATIENT SPEECH LANGUAGE PATHOLOGY  TREATMENT NOTE   Patient Name: Mark Durham MRN: [id number] DOB:[birth year], 72 y.o., male Today's Date: 12/07/2022   [facility]: [facility] [place] REFERRING PROVIDER: Hope Pigeon, MD   End of Session - 12/07/22 0849     SLP Start Time 0800    SLP Stop Time  0900    SLP Time Calculation (min) 60 min    Activity Tolerance Patient tolerated treatment well             No past medical history on file.  The histories are not reviewed yet. Please review them in the "History" navigator section and refresh this SmartLink. Patient Active Problem List   Diagnosis Date Noted   GERD (gastroesophageal reflux disease) 02/26/2021   S/P hip replacement, right 03/04/2020   Primary osteoarthritis of right hip 01/24/2020   Chronic pain of right knee 11/23/2019   Chronic right hip pain 11/23/2019   Osteoarthritis of knee 11/13/2018   History of adenomatous polyp of colon 02/14/2018   History of Barrett's esophagus 02/14/2018    ONSET DATE: 07/15/2022; date of referral 11/03/2022   REFERRING DIAG: Z86.73 (ICD-10-CM) - Personal history of transient ischemic attack (TIA), and cerebral infarction without residual deficits R41.3 (ICD-10-CM) - Other amnesia   THERAPY DIAG:  Cognitive communication deficit  Personal history of transient ischemic attack (TIA), and cerebral infarction without residual deficits  Rationale for Evaluation and Treatment Rehabilitation  SUBJECTIVE:   SUBJECTIVE STATEMENT: Pt pleasant, tendency to be distracted, reports having a good Memorial day Pt accompanied by: significant other  PERTINENT HISTORY: Mark Durham is a 72 y.o. male with PMH of hypertension, asthma, GERD, adenomatous polyp of colon, Barrett's esophagus, R sided sciatica, chronic back pain, greater trochanter bursitis, s/p total hip arthroplasty, R hip osteoarthritis who presents on 07/25/2022 with L MCA/PCA watershed infarct with right hemiparesis &  aphasia with course c/b post stroke seizures.   Pt was admitted to [facility] from 08/10/2022 thru 02/20/202. Speech Therapy discharge summary states "Pt has made fair progress during AIR stay. Very limited by cognitive, visual, functional mobility deficits. Pt exhibits some insight into deficits in stating "I don't know if my wife can handle it" referring to d/c tomorrow, however grossly decreased insight into impairments e.g. states "I got it" and then not taking any action."  Pt received HHST 09/15/2022 thru 10/27/2022.   DIAGNOSTIC FINDINGS:   MRI 07/25/2022 Watershed infarct between the left MCA and PCA territories.  Atrophy of the midbrain structures is noted.   MRI 07/26/2022 Subtle new focus of restricted diffusion and subtle FLAIR signal abnormality in the left thalamus compatible with interval infarct.  Slight increase in size of the left MCA PCA infarct.   PAIN:  Are you having pain? No   FALLS: Has patient fallen in last 6 months?  See PT evaluation for details  LIVING ENVIRONMENT: Lives with: lives with their spouse Lives in: House/apartment  PLOF:  Level of assistance: Independent with ADLs, Independent with IADLs Employment: Retired   PATIENT GOALS   to improve memory "I would like to be able to pay my own bills"  OBJECTIVE:   TODAY'S TREATMENT:  Skilled treatment session focused on pt's cognitive communication goals. SLP facilitated session by providing the following interventions:  Use of external memory aids:  Appointments: forgot to bring in current calendar will assess functionality during next session Medications: pt's wife referring to list and directions to organize pt's 0930 medicines Basic problem solving: medication management task -  pt perseverative in administration requiring moderate cues to reread instructions with each new medication; more than a reasonable amount of time required d/t difficulty problem solving how to open each slot using 2 hands  decreased flexibility in attempts To improve delayed responses, SLP instructed pt and his wife to play a known card game at home. Card game chosen by patient with improved response times noted as well as basic problem solving for known tasks.    PATIENT EDUCATION: Education details: see above Person educated: Patient and Spouse Education method: Explanation Education comprehension: verbalized understanding and needs further education  HOME EXERCISE PROGRAM:        Practice using calendar  Using play money or real money to practice basic counting and making change     GOALS:  Goals reviewed with patient? Yes  SHORT TERM GOALS: Target date: 10 sessions  Patient will demonstrate ability to follow 2-step directions (including L vs R) with 90% accuracy given min A cues. Baseline: Goal status: INITIAL   2.  Patient will be able to employ problem solving strategies to simple daily tasks for improved overall safety to 90% by 10/09/22 Baseline:  Goal status: INITIAL  3.  With Min A assistance, pt will use internal and external compensatory memory strategies to recall appts/daily information with 50% accuracy.  Baseline:  Goal status: INITIAL   LONG TERM GOALS: Target date: 02/16/2023  Patient will be able to employ problem solving skills to simple daily tasks for improved overall safety to 90%.  Baseline:  Goal status: INITIAL  2.  Patient will demonstrate improved awareness and safety by accurately problem solving scenarios form daily living in 5 out of 7 opportunities.  Baseline:  Goal status: INITIAL   ASSESSMENT:  CLINICAL IMPRESSION: Pt presents with moderate cognitive communication impairments impacting his ability to complete basic money and medication tasks. While pt is eager to participate in therapeutic activities and he responds well to cues, he demonstrate decreased overall insight into his global cognitive deficits. See treatment note for more details.     OBJECTIVE IMPAIRMENTS include attention, memory, awareness, executive functioning, expressive language, receptive language, and aphasia. These impairments are limiting patient from managing medications, managing appointments, managing finances, ADLs/IADLs, and effectively communicating at home and in community. Factors affecting potential to achieve goals and functional outcome are ability to learn/carryover information, co-morbidities, medical prognosis, and severity of impairments.. Patient will benefit from skilled SLP services to address above impairments and improve overall function.  REHAB POTENTIAL: Good  PLAN: SLP FREQUENCY: 1-2x/week  SLP DURATION: 12 weeks  PLANNED INTERVENTIONS: Language facilitation, Cueing hierachy, Cognitive reorganization, Internal/external aids, Functional tasks, SLP instruction and feedback, Compensatory strategies, and Patient/family education    Shakayla Hickox B. Dreama Saa, M.S., CCC-SLP, Tree surgeon Certified Brain Injury Specialist [facility]  [facility] Rehabilitation Services Office [phone] Ascom [phone] Fax [phone]

## 2022-12-08 ENCOUNTER — Ambulatory Visit: Payer: BC Managed Care – PPO | Admitting: Physical Therapy

## 2022-12-08 DIAGNOSIS — M6281 Muscle weakness (generalized): Secondary | ICD-10-CM

## 2022-12-08 DIAGNOSIS — R2681 Unsteadiness on feet: Secondary | ICD-10-CM

## 2022-12-08 DIAGNOSIS — R262 Difficulty in walking, not elsewhere classified: Secondary | ICD-10-CM | POA: Diagnosis not present

## 2022-12-08 DIAGNOSIS — R278 Other lack of coordination: Secondary | ICD-10-CM

## 2022-12-08 NOTE — Therapy (Signed)
OUTPATIENT PHYSICAL THERAPY NEURO TREATMENT   Patient Name: Mark Durham MRN: [id number] DOB:12-19-1950, 72 y.o., male Today's Date: 12/08/2022   PCP: [facility] REFERRING PROVIDER: Hope Pigeon MD   END OF SESSION:  PT End of Session - 12/08/22 1000     Visit Number 9    Number of Visits 24    Date for PT Re-Evaluation 01/26/23    Authorization Type [employer] COMM PRO    Authorization Time Period 11/03/22-01/26/23    PT Start Time 1003    PT Stop Time 1047    PT Time Calculation (min) 44 min    Equipment Utilized During Treatment Gait belt    Activity Tolerance Patient tolerated treatment well    Behavior During Therapy WFL for tasks assessed/performed             Past Medical History:  Diagnosis Date   Hypertension    Past Surgical History:  Procedure Laterality Date   TOTAL HIP ARTHROPLASTY Right    Patient Active Problem List   Diagnosis Date Noted   GERD (gastroesophageal reflux disease) 02/26/2021   S/P hip replacement, right 03/04/2020   Primary osteoarthritis of right hip 01/24/2020   Chronic pain of right knee 11/23/2019   Chronic right hip pain 11/23/2019   Osteoarthritis of knee 11/13/2018   History of adenomatous polyp of colon 02/14/2018   History of Barrett's esophagus 02/14/2018    ONSET DATE: January 2024  REFERRING DIAG: weakness due to stroke   THERAPY DIAG:  Muscle weakness (generalized)  Other lack of coordination  Difficulty in walking, not elsewhere classified  Unsteadiness on feet  Rationale for Evaluation and Treatment: Rehabilitation  SUBJECTIVE:                                                                                                                                                                                             SUBJECTIVE STATEMENT: Patient arrived early for PT treatment. Pt's family member reports that he had mild clonus on the RLE at cookout as well as increased difficulty managing the  RLE when walking around relative's house on that day, requiring assist family to prevent fall.    Pt accompanied by: significant other  PERTINENT HISTORY: Patient presents to physical therapy for weakness s/p stroke . 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 Per documentation he had a secondary stroke during the hospital stay as well as three seizures. Patient was admitted to [facility] inpatient rehabilitation from 08/10/22-08/31/22 where he was discharged home. Patient was discharged from Home health on 10/28/22 (PT, OT, and ST). PMH  includes GERD, OA, R knee/hip pain, Barrett's esophagus. Is using a walker, still drags right foot. Needs assistance for transfers but can bath and dress self.   PAIN:  Are you having pain? No  PRECAUTIONS: Fall  WEIGHT BEARING RESTRICTIONS: No  FALLS: Has patient fallen in last 6 months? No  LIVING ENVIRONMENT: Lives with: lives with their family Lives in: House/apartment Stairs: Yes: External: 5 steps; on right going up and on left going up Has following equipment at home: Walker - 2 wheeled, Wheelchair (manual), Grab bars, and lifted toilet and hospital bed, shower chair   PLOF: Independent  PATIENT GOALS: walking with less AD, get back to mowing the yard, gardening, grilling   OBJECTIVE:   DIAGNOSTIC FINDINGS:  CTA of the head:   1. No emergent finding. 2. Atherosclerosis with high-grade left V4, bilateral MCA and left PCA narrowings.   Head CT:   1. No acute finding. 2. 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   CTA of the neck:   1. No emergent finding. 2. Atherosclerosis of the cervical carotids without flow reducing stenosis. There is a notable outpouching from the right ICA bulb consistent with ulcerated plaque. 3. Partially covered 8 mm nodule in the left upper lobe. There is an internal calcification favoring granulomatous disease,  but partially covered. Recommend non emergent chest CT without contrast.    COGNITION: Overall cognitive status:  safety awareness, short term memory, directions, sequencing   SENSATION: WFL LEs  COORDINATION: Nose finger: dyskinesia of RUE Heel slide: limited spatial awareness of RLE    POSTURE: rounded shoulders, posterior pelvic tilt, and weight shift left   LOWER EXTREMITY MMT:    MMT Right Eval Left Eval  Hip flexion 4 3+  Hip extension    Hip abduction 4 3-  Hip adduction 4 3-  Knee flexion 4- 3  Knee extension 4 4-  Ankle dorsiflexion 3 4  Ankle plantarflexion 3 4  Ankle inversion    Ankle eversion    (Blank rows = not tested)   TRANSFERS: Assistive device utilized: Environmental consultant - 2 wheeled  Sit to stand: CGA and Min A Stand to sit: CGA and Min A Chair to chair: Min A   GAIT: Gait pattern: step through pattern, decreased stance time- Right, and poor foot clearance- Right Distance walked: 30 ft  Assistive device utilized: Walker - 2 wheeled Level of assistance: CGA Comments: significant foot drag of RLE, forgetting of where RLE is, where it is outside of   FUNCTIONAL TESTS:  5 times sit to stand: 38.37 with UE support  10 meter walk test: 19.2 seconds with RW; dragging of R foot  Berg Balance Scale: Perform next time if possible  PATIENT SURVEYS:  FOTO 40  TODAY'S TREATMENT:  DATE: 12/08/22  VS assessed: 126/80(65) HR assessed manually  76.  Standing 0 min 141/80 (98) HR 76.  Standing 1 min 145/87 (104) HR 86 After gait in standing  159/81(100) HR 86 Sitting 1 min rest: 139/71 (89) HR 59    Gait with RW x 161ft and CGA from PT. Noted to have decreased step length/height on the RLE when distracted and in turn. Assist from PT for sequencing for AD management.   Variable gait training without AD:  Forward/reverse 27ft x3, x 2  bouts  Side stepping R and L 10ft x 2, x 2 bouts  Step up/down 4 inch at rail x 5 bil.   Sit<>stand with UE pushing from arm rests x 5.  Sit<>stand pushing from thighs x 5 with CGA for anterior weight shift.   CGA provided by PT throughout session unless otherwise stated by PT, as well as cues for posture and attention to the RLE to reduce fall risk and improve attention to task.   PATIENT EDUCATION: Education details: goals, POC, HEP Pt educated throughout session about proper posture and technique with exercises. Improved exercise technique, movement at target joints, use of target muscles after min to mod verbal, visual, tactile cues.  Person educated: Patient and Spouse Education method: Explanation, Demonstration, Tactile cues, and Verbal cues Education comprehension: verbalized understanding, returned demonstration, verbal cues required, and tactile cues required  HOME EXERCISE PROGRAM: Access Code: [email or web address] URL: [email or web address] Date: 11/03/2022 Prepared by: Precious Bard  Exercises - Seated March  - 1 x daily - 7 x weekly - 2 sets - 10 reps - 5 hold - Seated Long Arc Quad  - 1 x daily - 7 x weekly - 2 sets - 10 reps - 5 hold - Seated Heel Toe Raises  - 1 x daily - 7 x weekly - 2 sets - 10 reps - 5 hold - Seated Hip Abduction  - 1 x daily - 7 x weekly - 2 sets - 10 reps - 5 hold - Seated Isometric Hip Adduction with Ball  - 1 x daily - 7 x weekly - 2 sets - 10 reps - 5 hold  GOALS: Goals reviewed with patient? Yes  SHORT TERM GOALS: Target date: 12/01/2022    Patient will be independent in home exercise program to improve strength/mobility for better functional independence with ADLs. Baseline:4/24: HEP given  Goal status: INITIAL    LONG TERM GOALS: Target date: 01/26/2023    Patient will increase FOTO score to equal to or greater than  51   to demonstrate statistically significant improvement in mobility and quality of life.   Baseline: 40 Goal  status: INITIAL  2.  Patient (> 44 years old) will complete five times sit to stand test in < 15 seconds indicating an increased LE strength and improved balance. Baseline: 4/24: 38.37 with BUE support Goal status: INITIAL  3.  Patient will increase 10 meter walk test to >1.55m/s as to improve gait speed for better community ambulation and to reduce fall risk. Baseline: 4/24: 19.2 seconds with RW; dragging of R foot  Goal status: INITIAL  4.  Patient will increase Berg Balance score by > 6 points to demonstrate decreased fall risk during functional activities. Baseline: 4/24: see next session 5/2: 30/56 Goal status: INITIAL    ASSESSMENT:  CLINICAL IMPRESSION:  Pt put forth good effort throughout PT treatment on this day. PT treatment focused on improved functional gait and movement patterns. Family reports decreased safety with gait at  family event with near fall. VS WNL with activity on this day. Requiring increased instruction from PT to improve attention to the RLE in distracting environment on this day, but overt LOB. Patient will benefit from continued skilled physical therapy to improve mobility, stability strength, and quality of life.   OBJECTIVE IMPAIRMENTS: Abnormal gait, cardiopulmonary status limiting activity, decreased activity tolerance, decreased balance, decreased cognition, decreased coordination, decreased endurance, decreased knowledge of condition, decreased knowledge of use of DME, decreased mobility, difficulty walking, decreased strength, decreased safety awareness, impaired perceived functional ability, impaired flexibility, impaired UE functional use, impaired vision/preception, improper body mechanics, and postural dysfunction.   ACTIVITY LIMITATIONS: carrying, lifting, bending, sitting, standing, squatting, sleeping, stairs, transfers, bed mobility, bathing, toileting, reach over head, hygiene/grooming, locomotion level, and caring for others  PARTICIPATION  LIMITATIONS: meal prep, cleaning, laundry, medication management, personal finances, interpersonal relationship, driving, shopping, community activity, and yard work  PERSONAL FACTORS: Age, Past/current experiences, Time since onset of injury/illness/exacerbation, Transportation, and 3+ comorbidities: GERD, OA, R knee/hip pain, Barrett's esophagus  are also affecting patient's functional outcome.   REHAB POTENTIAL: Good  CLINICAL DECISION MAKING: Evolving/moderate complexity  EVALUATION COMPLEXITY: Moderate  PLAN:  PT FREQUENCY: 2x/week  PT DURATION: 12 weeks  PLANNED INTERVENTIONS: Therapeutic exercises, Therapeutic activity, Neuromuscular re-education, Balance training, Gait training, Patient/Family education, Self Care, Joint mobilization, Joint manipulation, Stair training, Vestibular training, Canalith repositioning, Visual/preceptual remediation/compensation, Orthotic/Fit training, Electrical stimulation, Wheelchair mobility training, Spinal mobilization, Cryotherapy, Moist heat, Splintting, Taping, Ultrasound, Ionotophoresis 4mg /ml Dexamethasone, Manual therapy, and Re-evaluation  PLAN FOR NEXT SESSION:   Progress note and LTG assessment.   Grier Rocher PT, DPT  Physical Therapist - [place]  [facility]  10:53 AM 12/08/22

## 2022-12-13 ENCOUNTER — Ambulatory Visit: Payer: BC Managed Care – PPO | Attending: *Deleted | Admitting: Physical Therapy

## 2022-12-13 ENCOUNTER — Ambulatory Visit: Payer: BC Managed Care – PPO | Admitting: Occupational Therapy

## 2022-12-13 DIAGNOSIS — M6281 Muscle weakness (generalized): Secondary | ICD-10-CM

## 2022-12-13 DIAGNOSIS — Z8673 Personal history of transient ischemic attack (TIA), and cerebral infarction without residual deficits: Secondary | ICD-10-CM | POA: Diagnosis present

## 2022-12-13 DIAGNOSIS — H539 Unspecified visual disturbance: Secondary | ICD-10-CM | POA: Diagnosis present

## 2022-12-13 DIAGNOSIS — I69398 Other sequelae of cerebral infarction: Secondary | ICD-10-CM | POA: Insufficient documentation

## 2022-12-13 DIAGNOSIS — R262 Difficulty in walking, not elsewhere classified: Secondary | ICD-10-CM | POA: Insufficient documentation

## 2022-12-13 DIAGNOSIS — R278 Other lack of coordination: Secondary | ICD-10-CM | POA: Insufficient documentation

## 2022-12-13 DIAGNOSIS — R2681 Unsteadiness on feet: Secondary | ICD-10-CM | POA: Diagnosis present

## 2022-12-13 DIAGNOSIS — R41841 Cognitive communication deficit: Secondary | ICD-10-CM | POA: Insufficient documentation

## 2022-12-13 NOTE — Therapy (Addendum)
OUTPATIENT OCCUPATIONAL THERAPY NEURO TREATMENT NOTE  Patient Name: Mark Durham MRN: [id number] DOB:1950-09-25, 72 y.o., male Today's Date: 12/13/2022  [facility]P: [facility] REFERRING PROVIDER: Hope Pigeon, MD  END OF SESSION:  OT End of Session - 12/13/22 1214     Visit Number 4    Number of Visits 24    Date for OT Re-Evaluation 02/07/23    OT Start Time 0930    OT Stop Time 1015    OT Time Calculation (min) 45 min    Equipment Utilized During Treatment transport chair    Activity Tolerance Patient tolerated treatment well    Behavior During Therapy WFL for tasks assessed/performed            Past Medical History:  Diagnosis Date   Hypertension    Past Surgical History:  Procedure Laterality Date   TOTAL HIP ARTHROPLASTY Right    Patient Active Problem List   Diagnosis Date Noted   GERD (gastroesophageal reflux disease) 02/26/2021   S/P hip replacement, right 03/04/2020   Primary osteoarthritis of right hip 01/24/2020   Chronic pain of right knee 11/23/2019   Chronic right hip pain 11/23/2019   Osteoarthritis of knee 11/13/2018   History of adenomatous polyp of colon 02/14/2018   History of Barrett's esophagus 02/14/2018   ONSET DATE:  07/25/2022  REFERRING DIAG:  CVA  THERAPY DIAG:  Muscle weakness (generalized)  Vision disturbance following CVA (cerebrovascular accident)  Rationale for Evaluation and Treatment: Rehabilitation  SUBJECTIVE:  SUBJECTIVE STATEMENT:  Pt. Reports doing he is doing well today.  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 Per chart review, Pt. had a secondary stroke during the hospital stay as well as three seizures. Patient was admitted to [facility] Acute inpatient rehabilitation from 08/10/22-08/31/22 where he was discharged home. Patient was discharged from Home health on 10/28/22 (PT, OT, and ST). PMH includes: GERD, OA, R  knee/hip pain, Barrett's esophagus.  PRECAUTIONS: None  WEIGHT BEARING RESTRICTIONS: No  PAIN:  Are you having pain? No  FALLS: Has patient fallen in last 6 months? No  LIVING ENVIRONMENT: Lives with: lives with their spouse Lives in: House/apartment Stairs: 5 steps to enter; one level home Has following equipment at home: Environmental consultant - 2 wheeled, Wheelchair (manual), shower chair, and bed side commode  PLOF: Independent  PATIENT GOALS: To get to walking  OBJECTIVE:   HAND DOMINANCE: Right  ADLs: Transfers/ambulation related to ADLs: Eating: Independent with utensils, assist with cutting food. Grooming: Independent oral care, wife assists with shaving. UB Dressing: Independent LB Dressing: independent Toileting: Independent Bathing: Once in the shower Supervision with bathing Tub Shower transfers: CGA with showere chair   IADLs: Shopping: Wife performs Light housekeeping: Wife performs most IADLs Meal Prep:  Meal preparation Community mobility: Relies on family and friends for driving. Medication management: Wife sets up, and provides medication Financial management: Wife completes Handwriting:  Pt. Wife reports Difficulty with identifying letters, and numbers   MOBILITY STATUS: Uses a rolling walker  POSTURE COMMENTS:  No Significant postural limitations  ACTIVITY TOLERANCE: Activity tolerance:  Fair  FUNCTIONAL OUTCOME MEASURES: FOTO: 53  TR score: 55  UPPER EXTREMITY ROM:    Active ROM Right Eval WFL Left Eval [facility]  Shoulder flexion    Shoulder abduction    Shoulder adduction    Shoulder extension    Shoulder internal rotation    Shoulder external rotation  Elbow flexion    Elbow extension    Wrist flexion    Wrist extension    Wrist ulnar deviation    Wrist radial deviation    Wrist pronation    Wrist supination    (Blank rows = not tested)  UPPER EXTREMITY MMT:     MMT Right Eval 4/5 Left Eval 4+/5  Shoulder flexion 4-/5 4+/5   Shoulder abduction    Shoulder adduction    Shoulder extension    Shoulder internal rotation    Shoulder external rotation    Middle trapezius    Lower trapezius    Elbow flexion 4+/5 5/5  Elbow extension 4+/5 5/5  Wrist flexion    Wrist extension 4/5 4+/5  Wrist ulnar deviation    Wrist radial deviation    Wrist pronation    Wrist supination    (Blank rows = not tested)  HAND FUNCTION: Grip strength: Right: 35 lbs; Left: 25 lbs, Lateral pinch: Right: 13 lbs, Left: 11 lbs, and 3 point pinch: Right: 11 lbs, Left: Pt. Unable to assume 3pt. Pinch position  COORDINATION: 9 Hole Peg test: Right: placement/removal of 5 pegs in 1 min. & 5 sec; Left: 50 sec Pt. Has difficulty seeing items in the dish on the right side. Pt. Kept moving the vertical pegs between holes with the right  SENSATION: Light touch: WFL Proprioception: Impaired   EDEMA: Intact   MUSCLE TONE: RUE: Mild flexor tone. Wife reports right hand presents with flexion in the right hand  COGNITION: Overall cognitive status:  Pt.'s wife reports noticing the Pt. has difficulty with numbers, and letters.  VISION:    Wife reports the Pt. frequently misses items on the right, and bumping into obstacle on the right   VISION ASSESSMENT: To be further assessed in functional context  11/24/22: impaired smooth pursuits all quadrants, requiring additional head turns; impaired saccades (slow pace, requiring head turns).  Visual fields appear grossly intact, but difficult to assess d/t decreased attention and ability to consistently follow a 2 step command. Moderate-severe R sided inattention noted.  Pt. Has a neuro optometrist appointment scheduled for June.   PERCEPTION:  TBD  PRAXIS: Impaired: Motor planning  TODAY'S TREATMENT:                                                                                                                              DATE: 12/13/2022   Therapeutic Activities:  Pt. participated in the  Motor-Free Visual Perception test to determine how vision may be affecting daily care tasks. Pt. Misidentified 8/36. Pt. Misidentified 5/15 on the R side and 3/21 on the L side. The test norms indicate the pt. Is WFL for the amount of items correctly identified in his age range. However, the timeframe is outside the normal limits. Pt. completed assessment in 14 minutes and 5 seconds total for all sections combined. Pt. worked on visual scanning activities by completing 2 simple mazes on paper.  Pt. required increased time to complete scanning task.  Pt. worked on Therapist, sports patterns using the [employer] requiring step-by-step visual, and verbal cues.   PATIENT EDUCATION: Education details: Increasing R sided visual attention: family to sit to the R of patient at kitchen table; place ADL supplies to pt's R for set up and provide reminders as needed for head turns (ie remote control, cup, toothbrush) Person educated: Patient, spouse Education method: explanation, vc Education comprehension: verbalized understanding and returned demonstration  HOME EXERCISE PROGRAM: [place]quo[place] theraputty; recommended solitaire for increasing R sided visual attention  GOALS: Goals reviewed with patient?  Yes   SHORT TERM GOALS: Target date: 12/27/2022  Pt. Will require Supervision for bilateral UE HEPs.  Baseline: Eval: No current HEP  Goal status: INITIAL  LONG TERM GOALS: Target date: 02/07/2023    Pt. Will increase FOTO score by 2 points for Pt. perceived improvement with assessment specific ADLs, and IADLs  Baseline: Eval:  FOTO score: 53 TR score: 55 Goal status: INITIAL  2.  Pt. Will increase bilateral UE strength by 2 mm grades overall to assist with ADLs, and IADLs Baseline: Eval: Right:Shoulder flexion 4/5, abduction 4-/5, elbow flexion 4+/5, extension 4+/5, wrist extension 4/5 Left Shoulder flexion 4+/5, abduction 4+/5, elbow flexion 5/5, extension 5/5, wrist  extension: 4+/5 Goal status: INITIAL  3.  Pt. Will improve bilateral hand [facility] skills by 10 sec. In order to be able to manipulate objects during ADLs, and IADLs Baseline: Eval: Right: Pt. Placed/removed 5 pegs in 1 min, & 5 sec. Pt. Kept moving the vertical pegs between holes with the right.  Left: 50 sec.  Goal status: INITIAL  4.  Pt. Will consistently implements visual compensatory strategies for ADLs, and IADLs Baseline: Eval: Pt. Currently does not utilize. Education to be provided. Goal status: INITIAL  5. Pt. Will improve bilateral grip strength to be able to securely hold items for ADLs, and IADLs. [place]: Right: 35#, Left: 25# Goal status: INITIAL 6. Pt. Will improve bilateral lateral pinch strength to be able to securely hold items for ADLs, and IADLs. [place]ne: Right: 13# , Left: 11# Goal status: INITIAL  ASSESSMENT:  CLINICAL IMPRESSION:  Pt. Required increased time when completing the Motor-Free Visual Perception Test with 5/15 items misidentified on the right, and 3/21 items misidentified on the left. Pt. required increased time and verbal cues when completing maze activity. Pt. experienced difficulties navigating through to completing simple mazes accurately. Pt. Frequently crossed over the maze path borders, and required increased cues to exit at the end of the circular maze. Pt. required fewer verbal, and visual cues when following the peg design from left to right, however had increased difficulty working in the reverse. Pt. required cues to place the container on the right side of the board, as Pt. frequently attempts to place the container to the left. Pt. continues to benefit from OT services to work on improving bilateral UE strength, grip strength, pinch strength, motor control, and [facility] skills, provide activities for increasing attention to R visual field, and review visual, and cognitive compensatory strategies in order to improve, and maximize independence with ADLs,  and IADL tasks.   PERFORMANCE DEFICITS: in functional skills including ADLs, IADLs, coordination, dexterity, ROM, strength, Fine motor control, Gross motor control, decreased knowledge of use of DME, and UE functional use, cognitive skills including, and psychosocial skills including.   IMPAIRMENTS: are limiting patient from ADLs, IADLs, play, and leisure.   CO-MORBIDITIES: may have co-morbidities  that affects occupational performance. Patient will benefit from skilled OT to address above impairments and improve overall function.  MODIFICATION OR ASSISTANCE TO COMPLETE EVALUATION: Min-Moderate modification of tasks or assist with assess necessary to complete an evaluation.  OT OCCUPATIONAL PROFILE AND HISTORY: Detailed assessment: Review of records and additional review of physical, cognitive, psychosocial history related to current functional performance.  CLINICAL DECISION MAKING: Moderate - several treatment options, min-mod task modification necessary  REHAB POTENTIAL: Good  EVALUATION COMPLEXITY: Moderate    PLAN:  OT FREQUENCY: 2x/week  OT DURATION: 12 weeks  PLANNED INTERVENTIONS: self care/ADL training, therapeutic exercise, therapeutic activity, neuromuscular re-education, manual therapy, and functional mobility training  RECOMMENDED OTHER SERVICES:  OT and ST  CONSULTED AND AGREED WITH PLAN OF CARE: Patient  PLAN FOR NEXT SESSION: Initiate Treatment  Herma Carson, OTS 12/13/2022 1:24 pm  This entire session was performed under the direct supervision and direction of a licensed therapist. I have personally read, edited, and approve of the note as written.   Olegario Messier, MS, OTR/L  12/13/2022 1:24 pm

## 2022-12-13 NOTE — Therapy (Signed)
OUTPATIENT PHYSICAL THERAPY NEURO TREATMENT/ PHYSICAL THERAPY PROGRESS NOTE   Dates of reporting period  11/03/2022  to  12/13/2022     Patient Name: Mark Durham MRN: [id number] DOB:1950/10/13, 72 y.o., male Today's Date: 12/13/2022   PCP: [facility] REFERRING PROVIDER: Hope Pigeon MD   END OF SESSION:  PT End of Session - 12/13/22 0931     Visit Number 10    Number of Visits 24    Date for PT Re-Evaluation 01/26/23    Authorization Type [employer] COMM PRO    Authorization Time Period 11/03/22-01/26/23    PT Start Time 1018    PT Stop Time 1101    PT Time Calculation (min) 43 min    Equipment Utilized During Treatment Gait belt    Activity Tolerance Patient tolerated treatment well    Behavior During Therapy WFL for tasks assessed/performed             Past Medical History:  Diagnosis Date   Hypertension    Past Surgical History:  Procedure Laterality Date   TOTAL HIP ARTHROPLASTY Right    Patient Active Problem List   Diagnosis Date Noted   GERD (gastroesophageal reflux disease) 02/26/2021   S/P hip replacement, right 03/04/2020   Primary osteoarthritis of right hip 01/24/2020   Chronic pain of right knee 11/23/2019   Chronic right hip pain 11/23/2019   Osteoarthritis of knee 11/13/2018   History of adenomatous polyp of colon 02/14/2018   History of Barrett's esophagus 02/14/2018    ONSET DATE: January 2024  REFERRING DIAG: weakness due to stroke   THERAPY DIAG:  Muscle weakness (generalized)  Other lack of coordination  Unsteadiness on feet  Difficulty in walking, not elsewhere classified  Rationale for Evaluation and Treatment: Rehabilitation  SUBJECTIVE:                                                                                                                                                                                             SUBJECTIVE STATEMENT: Pt's family member present and states that pt had a fall from chair  last week. Pt states that he dropped something in floor, and tried to pick it up from floor, lost his balance, and "slid" to floor. No pain or LOC from fall according to pt. Was able to get up from floor with help from family.     Pt accompanied by: significant other  PERTINENT HISTORY: Patient presents to physical therapy for weakness s/p stroke . 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 Per documentation he had a secondary stroke during the  hospital stay as well as three seizures. Patient was admitted to [facility] Acute inpatient rehabilitation from 08/10/22-08/31/22 where he was discharged home. Patient was discharged from Home health on 10/28/22 (PT, OT, and ST). PMH includes GERD, OA, R knee/hip pain, Barrett's esophagus. Is using a walker, still drags right foot. Needs assistance for transfers but can bath and dress self.   PAIN:  Are you having pain? No  PRECAUTIONS: Fall  WEIGHT BEARING RESTRICTIONS: No  FALLS: Has patient fallen in last 6 months? No  LIVING ENVIRONMENT: Lives with: lives with their family Lives in: House/apartment Stairs: Yes: External: 5 steps; on right going up and on left going up Has following equipment at home: Walker - 2 wheeled, Wheelchair (manual), Grab bars, and lifted toilet and hospital bed, shower chair   PLOF: Independent  PATIENT GOALS: walking with less AD, get back to mowing the yard, gardening, grilling   OBJECTIVE:   DIAGNOSTIC FINDINGS:  CTA of the head:   1. No emergent finding. 2. Atherosclerosis with high-grade left V4, bilateral MCA and left PCA narrowings.   Head CT:   1. No acute finding. 2. 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   CTA of the neck:   1. No emergent finding. 2. Atherosclerosis of the cervical carotids without flow reducing stenosis. There is a notable outpouching from the right ICA bulb consistent with ulcerated  plaque. 3. Partially covered 8 mm nodule in the left upper lobe. There is an internal calcification favoring granulomatous disease, but partially covered. Recommend non emergent chest CT without contrast.    COGNITION: Overall cognitive status:  safety awareness, short term memory, directions, sequencing   SENSATION: WFL LEs  COORDINATION: Nose finger: dyskinesia of RUE Heel slide: limited spatial awareness of RLE    POSTURE: rounded shoulders, posterior pelvic tilt, and weight shift left   LOWER EXTREMITY MMT:    MMT Right Eval Left Eval  Hip flexion 4 3+  Hip extension    Hip abduction 4 3-  Hip adduction 4 3-  Knee flexion 4- 3  Knee extension 4 4-  Ankle dorsiflexion 3 4  Ankle plantarflexion 3 4  Ankle inversion    Ankle eversion    (Blank rows = not tested)   TRANSFERS: Assistive device utilized: Environmental consultant - 2 wheeled  Sit to stand: CGA and Min A Stand to sit: CGA and Min A Chair to chair: Min A   GAIT: Gait pattern: step through pattern, decreased stance time- Right, and poor foot clearance- Right Distance walked: 30 ft  Assistive device utilized: Walker - 2 wheeled Level of assistance: CGA Comments: significant foot drag of RLE, forgetting of where RLE is, where it is outside of   FUNCTIONAL TESTS:  5 times sit to stand: 38.37 with UE support   10 meter walk test: 19.2 seconds with RW; dragging of R foot  Berg Balance Scale: Perform next time if possible  PATIENT SURVEYS:  FOTO 40  TODAY'S TREATMENT:  DATE: 12/13/22  Foto: 41   Pt performed 5 time sit<>stand (5xSTS): 21.02 sec pushing from arm rest on chair. Pushing from thighs 21.97 sec (>15 sec indicates increased fall risk)   10 Meter Walk Test: Patient instructed to walk 10 meters (32.8 ft) as quickly and as safely as possible at their normal speed x2 and at a fast  speed x2. Time measured from 2 meter mark to 8 meter mark to accommodate ramp-up and ramp-down.  With RW speed 1: 24.3sec 0.41 m/s With RWspeed 2: 15.5sec  0.65 m/s Average Normal speed: 0.53 m/s No AD speed 1: 19.3 m/s No AD speed 2: 15.05 m/s Average Fast speed:17.175sec 0.58 m/s Cut off scores: <0.4 m/s = household Ambulator, 0.4-0.8 m/s = limited community Ambulator, >0.8 m/s = community Ambulator, >1.2 m/s = crossing a street, <1.0 = increased fall risk MCID 0.05 m/s (small), 0.13 m/s (moderate), 0.06 m/s (significant)  (ANPTA Core Set of Outcome Measures for Adults with Neurologic Conditions, 2018)  Patient demonstrates increased fall risk as noted by score of   31/56 on Berg Balance Scale.  (<36= high risk for falls, close to 100%; 37-45 significant >80%; 46-51 moderate >50%; 52-55 lower >25%)  CGA provided by PT throughout session unless otherwise stated by PT, as well as cues for posture and attention to the RLE to reduce fall risk and improve attention to task.   PATIENT EDUCATION: Education details: goals, POC, HEP Pt educated throughout session about proper posture and technique with exercises. Improved exercise technique, movement at target joints, use of target muscles after min to mod verbal, visual, tactile cues.  Person educated: Patient and Spouse Education method: Explanation, Demonstration, Tactile cues, and Verbal cues Education comprehension: verbalized understanding, returned demonstration, verbal cues required, and tactile cues required  HOME EXERCISE PROGRAM: Access Code: [email or web address] URL: [email or web address] Date: 11/03/2022 Prepared by: Precious Bard  Exercises - Seated March  - 1 x daily - 7 x weekly - 2 sets - 10 reps - 5 hold - Seated Long Arc Quad  - 1 x daily - 7 x weekly - 2 sets - 10 reps - 5 hold - Seated Heel Toe Raises  - 1 x daily - 7 x weekly - 2 sets - 10 reps - 5 hold - Seated Hip Abduction  - 1 x daily - 7 x weekly - 2 sets - 10  reps - 5 hold - Seated Isometric Hip Adduction with Ball  - 1 x daily - 7 x weekly - 2 sets - 10 reps - 5 hold  GOALS: Goals reviewed with patient? Yes  SHORT TERM GOALS: Target date: 01/17/2023      Patient will be independent in home exercise program to improve strength/mobility for better functional independence with ADLs. Baseline:4/24: HEP given  Goal status: IN PROGRESS    LONG TERM GOALS: Target date: 01/26/2023    Patient will increase FOTO score to equal to or greater than  51   to demonstrate statistically significant improvement in mobility and quality of life.   Baseline: 40 6/3: 41 Goal status: IN PROGRESS  2.  Patient (> 48 years old) will complete five times sit to stand test in < 15 seconds indicating an increased LE strength and improved balance. Baseline: 4/24: 38.37 with BUE support 6/3: 12.02 sec with UE support Goal status: IN PROGRESS  3.  Patient will increase 10 meter walk test to >1.102m/s as to improve gait speed for better community ambulation and to reduce fall risk. Baseline:  4/24: 19.2 seconds with RW; dragging of R foot  6/3: 17.175 with no AD and CGA from PT.   Goal status: IN PROGRESS  4.  Patient will increase Berg Balance score by > 6 points to demonstrate decreased fall risk during functional activities. Baseline: 4/24: see next session 5/2: 30/56 6/3: 31/56 Goal status: IN PROGRESS    ASSESSMENT:  CLINICAL IMPRESSION:  Pt put forth good effort throughout PT treatment on this day. PT instructed pt in progress note assessment to measure progress toward goals. See above for details.  Patient demonstrates improved balance as evidenced by increased gait speed improved score on Berg balance test and decreased time on 5 times up to stand.  Patient continues to demonstrate decreased attention to the right visual field, right lower extremity, right upper extremity.  Family states noted improved safety and function with mobility at the household  level, and improved safety with partial community access.   Patient will benefit from continued skilled physical therapy to improve mobility, stability strength, and quality of life.  Patient's condition has the potential to improve in response to therapy. Maximum improvement is yet to be obtained. The anticipated improvement is attainable and reasonable in a generally predictable time.   OBJECTIVE IMPAIRMENTS: Abnormal gait, cardiopulmonary status limiting activity, decreased activity tolerance, decreased balance, decreased cognition, decreased coordination, decreased endurance, decreased knowledge of condition, decreased knowledge of use of DME, decreased mobility, difficulty walking, decreased strength, decreased safety awareness, impaired perceived functional ability, impaired flexibility, impaired UE functional use, impaired vision/preception, improper body mechanics, and postural dysfunction.   ACTIVITY LIMITATIONS: carrying, lifting, bending, sitting, standing, squatting, sleeping, stairs, transfers, bed mobility, bathing, toileting, reach over head, hygiene/grooming, locomotion level, and caring for others  PARTICIPATION LIMITATIONS: meal prep, cleaning, laundry, medication management, personal finances, interpersonal relationship, driving, shopping, community activity, and yard work  PERSONAL FACTORS: Age, Past/current experiences, Time since onset of injury/illness/exacerbation, Transportation, and 3+ comorbidities: GERD, OA, R knee/hip pain, Barrett's esophagus  are also affecting patient's functional outcome.   REHAB POTENTIAL: Good  CLINICAL DECISION MAKING: Evolving/moderate complexity  EVALUATION COMPLEXITY: Moderate  PLAN:  PT FREQUENCY: 2x/week  PT DURATION: 12 weeks  PLANNED INTERVENTIONS: Therapeutic exercises, Therapeutic activity, Neuromuscular re-education, Balance training, Gait training, Patient/Family education, Self Care, Joint mobilization, Joint manipulation,  Stair training, Vestibular training, Canalith repositioning, Visual/preceptual remediation/compensation, Orthotic/Fit training, Electrical stimulation, Wheelchair mobility training, Spinal mobilization, Cryotherapy, Moist heat, Splintting, Taping, Ultrasound, Ionotophoresis 4mg /ml Dexamethasone, Manual therapy, and Re-evaluation  PLAN FOR NEXT SESSION:   6-minute walk test. Assess and adjust HEP Dynamic balance training endurance training Visual scanning tasks.   Grier Rocher PT, DPT  Physical Therapist - [facility]  11:07 AM 12/13/22   Note: Portions of this document were prepared using Dragon voice recognition software and although reviewed may contain unintentional dictation errors in syntax, grammar, or spelling.

## 2022-12-15 ENCOUNTER — Ambulatory Visit: Payer: BC Managed Care – PPO | Admitting: Speech Pathology

## 2022-12-15 ENCOUNTER — Ambulatory Visit: Payer: BC Managed Care – PPO | Admitting: Occupational Therapy

## 2022-12-15 DIAGNOSIS — R41841 Cognitive communication deficit: Secondary | ICD-10-CM

## 2022-12-15 DIAGNOSIS — Z8673 Personal history of transient ischemic attack (TIA), and cerebral infarction without residual deficits: Secondary | ICD-10-CM

## 2022-12-15 DIAGNOSIS — M6281 Muscle weakness (generalized): Secondary | ICD-10-CM

## 2022-12-15 NOTE — Therapy (Signed)
OUTPATIENT SPEECH LANGUAGE PATHOLOGY  TREATMENT NOTE DISCHARGE SUMMARY   Patient Name: Mark Durham MRN: [id number] DOB:1950/08/08, 72 y.o., male Today's Date: 12/15/2022   PCP: [facility] [place] REFERRING PROVIDER: Hope Pigeon, MD   End of Session - 12/15/22 0803     Visit Number 5    Number of Visits 25    Date for SLP Re-Evaluation 02/16/23    Authorization Type [employer] COMM PPO    Authorization Time Period 30 max combined visits    Progress Note Due on Visit 10    SLP Start Time 0800    SLP Stop Time  0900    SLP Time Calculation (min) 60 min    Activity Tolerance Patient tolerated treatment well             No past medical history on file.  The histories are not reviewed yet. Please review them in the "History" navigator section and refresh this SmartLink. Patient Active Problem List   Diagnosis Date Noted   GERD (gastroesophageal reflux disease) 02/26/2021   S/P hip replacement, right 03/04/2020   Primary osteoarthritis of right hip 01/24/2020   Chronic pain of right knee 11/23/2019   Chronic right hip pain 11/23/2019   Osteoarthritis of knee 11/13/2018   History of adenomatous polyp of colon 02/14/2018   History of Barrett's esophagus 02/14/2018    ONSET DATE: 07/15/2022; date of referral 11/03/2022   REFERRING DIAG: Z86.73 (ICD-10-CM) - Personal history of transient ischemic attack (TIA), and cerebral infarction without residual deficits R41.3 (ICD-10-CM) - Other amnesia   THERAPY DIAG:  Cognitive communication deficit  Personal history of transient ischemic attack (TIA), and cerebral infarction without residual deficits  Rationale for Evaluation and Treatment Rehabilitation  SUBJECTIVE:   SUBJECTIVE STATEMENT: Pt's wife brought in pt's calendar and provided information regarding recent changes to his medicine Pt accompanied by: significant other  PERTINENT HISTORY: Mark Durham is a 72 y.o. 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   Pt was admitted to [facility] from 08/10/2022 thru 02/20/202. Speech Therapy discharge summary states "Pt has made fair progress during AIR stay. Very limited by cognitive, visual, functional mobility deficits. Pt exhibits some insight into deficits in stating "I don't know if my wife can handle it" referring to d/c tomorrow, however grossly decreased insight into impairments e.g. states "I got it" and then not taking any action."  Pt received [facility]ST 09/15/2022 thru 10/27/2022.   DIAGNOSTIC FINDINGS:   MRI 07/25/2022 Watershed infarct between the left MCA and PCA territories.  Atrophy of the midbrain structures is noted.   MRI 07/26/2022 Subtle new focus of restricted diffusion and subtle FLAIR signal abnormality in the left thalamus compatible with interval infarct.  Slight increase in size of the left MCA PCA infarct.   PAIN:  Are you having pain? No   FALLS: Has patient fallen in last 6 months?  See PT evaluation for details  LIVING ENVIRONMENT: Lives with: lives with their spouse Lives in: House/apartment  PLOF:  Level of assistance: Independent with ADLs, Independent with IADLs Employment: Retired   PATIENT GOALS   to improve memory "I would like to be able to pay my own bills"  OBJECTIVE:   TODAY'S TREATMENT:  Skilled treatment session focused on pt's cognitive communication goals. SLP facilitated session by providing the following interventions:  SLP provided verbal instruction in tasks to perform at home that promote cognitive stimulation. In addition, SLP engaged pt in game of checkers to target attention to task, working memory, problem solving and visual  neglect. Pt able to problem solve basic moves and with moderate faded to minimal cues, he was able to place each checker in the appropriate square (compensating for visual and RUE deficits). Pt's wife voiced understanding of rules and state they will play at home.    PATIENT EDUCATION: Education details: see above Person educated: Patient and Spouse Education method: Explanation Education comprehension: verbalized understanding and needs further education  HOME EXERCISE PROGRAM:        Practice using calendar  Using play money or real money to practice basic counting and making change  Engage in cognitively stimulating games (card games, checkers)     GOALS:  Goals reviewed with patient? Yes  SHORT TERM GOALS: Target date: 10 sessions  Patient will demonstrate ability to follow 2-step directions (including L vs R) with 90% accuracy given min A cues. Baseline: Goal status: INITIAL; met when provided with more than a responsible about of time   2.  Patient will be able to employ problem solving strategies to simple daily tasks for improved overall safety to 90% by 10/09/22 Baseline:  Goal status: INITIAL; met when provided with more than a responsible amount of time  3.  With Min A assistance, pt will use internal and external compensatory memory strategies to recall appts/daily information with 50% accuracy.  Baseline:  Goal status: INITIAL; met    LONG TERM GOALS: Target date: 02/16/2023  Patient will be able to employ problem solving skills to simple daily tasks for improved overall safety to 90%.  Baseline:  Goal status: INITIAL; ongoing - limited by decreased awareness of deficits  2.  Patient will demonstrate improved awareness and safety by accurately problem solving scenarios form daily living in 5 out of 7 opportunities.  Baseline:  Goal status: INITIAL; limited progress made   ASSESSMENT:  CLINICAL IMPRESSION: Pt presents with moderate cognitive communication  impairments impacting his ability to complete basic money and medication tasks. While pt is eager to participate in therapeutic activities and he responds well to cues, he demonstrates decreased overall insight into his global cognitive deficits. As such, suspect that pt would benefit from additional services by PT/OT to address safety given his limited number of visits per insurance. See treatment note for more details.     PLAN:  Discharge from services.    Rilley Poulter B. Dreama Saa, M.S., CCC-SLP, Tree surgeon Certified Brain Injury Specialist [facility]  [facility] Rehabilitation Services Office [phone] Ascom [phone] Fax [phone]

## 2022-12-15 NOTE — Therapy (Addendum)
OUTPATIENT OCCUPATIONAL THERAPY NEURO TREATMENT NOTE  Patient Name: Mark Durham MRN: [id number] DOB:1950/10/30, 72 y.o., male Today's Date: 12/15/2022  PCP: [facility] REFERRING PROVIDER: Hope Pigeon, MD  END OF SESSION:  OT End of Session - 12/15/22 0954     Visit Number 5    Number of Visits 24    Date for OT Re-Evaluation 02/07/23    OT Start Time 0900    OT Stop Time 0945    OT Time Calculation (min) 45 min    Equipment Utilized During Treatment transport chair    Activity Tolerance Patient tolerated treatment well    Behavior During Therapy [facility] for tasks assessed/performed            Past Medical History:  Diagnosis Date   Hypertension    Past Surgical History:  Procedure Laterality Date   TOTAL HIP ARTHROPLASTY Right    Patient Active Problem List   Diagnosis Date Noted   GERD (gastroesophageal reflux disease) 02/26/2021   S/P hip replacement, right 03/04/2020   Primary osteoarthritis of right hip 01/24/2020   Chronic pain of right knee 11/23/2019   Chronic right hip pain 11/23/2019   Osteoarthritis of knee 11/13/2018   History of adenomatous polyp of colon 02/14/2018   History of Barrett's esophagus 02/14/2018   ONSET DATE:  07/25/2022  REFERRING DIAG:  CVA  THERAPY DIAG:  Muscle weakness (generalized)  Rationale for Evaluation and Treatment: Rehabilitation  SUBJECTIVE:  SUBJECTIVE STATEMENT:  Pt. Reports doing he is doing well today.  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 Per chart review, Pt. had a secondary stroke during the hospital stay as well as three seizures. Patient was admitted to [facility] Acute inpatient rehabilitation from 08/10/22-08/31/22 where he was discharged home. Patient was discharged from Home health on 10/28/22 (PT, OT, and ST). PMH includes: GERD, OA, R knee/hip pain, Barrett's esophagus.  PRECAUTIONS:  None  WEIGHT BEARING RESTRICTIONS: No  PAIN:  Are you having pain? No  FALLS: Has patient fallen in last 6 months? No  LIVING ENVIRONMENT: Lives with: lives with their spouse Lives in: House/apartment Stairs: 5 steps to enter; one level home Has following equipment at home: Environmental consultant - 2 wheeled, Wheelchair (manual), shower chair, and bed side commode  PLOF: Independent  PATIENT GOALS: To get to walking  OBJECTIVE:   HAND DOMINANCE: Right  ADLs: Transfers/ambulation related to ADLs: Eating: Independent with utensils, assist with cutting food. Grooming: Independent oral care, wife assists with shaving. UB Dressing: Independent LB Dressing: independent Toileting: Independent Bathing: Once in the shower Supervision with bathing Tub Shower transfers: CGA with showere chair   IADLs: Shopping: Wife performs Light housekeeping: Wife performs most IADLs Meal Prep:  Meal preparation Community mobility: Relies on family and friends for driving. Medication management: Wife sets up, and provides medication Financial management: Wife completes Handwriting:  Pt. Wife reports Difficulty with identifying letters, and numbers   MOBILITY STATUS: Uses a rolling walker  POSTURE COMMENTS:  No Significant postural limitations  ACTIVITY TOLERANCE: Activity tolerance:  Fair  FUNCTIONAL OUTCOME MEASURES: FOTO: 53  TR score: 55  UPPER EXTREMITY ROM:    Active ROM Right Eval WFL Left Eval [employer]  Shoulder flexion    Shoulder abduction    Shoulder adduction    Shoulder extension    Shoulder internal rotation    Shoulder external rotation    Elbow flexion  Elbow extension    Wrist flexion    Wrist extension    Wrist ulnar deviation    Wrist radial deviation    Wrist pronation    Wrist supination    (Blank rows = not tested)  UPPER EXTREMITY MMT:     MMT Right Eval 4/5 Left Eval 4+/5  Shoulder flexion 4-/5 4+/5  Shoulder abduction    Shoulder adduction    Shoulder  extension    Shoulder internal rotation    Shoulder external rotation    Middle trapezius    Lower trapezius    Elbow flexion 4+/5 5/5  Elbow extension 4+/5 5/5  Wrist flexion    Wrist extension 4/5 4+/5  Wrist ulnar deviation    Wrist radial deviation    Wrist pronation    Wrist supination    (Blank rows = not tested)  HAND FUNCTION: Grip strength: Right: 35 lbs; Left: 25 lbs, Lateral pinch: Right: 13 lbs, Left: 11 lbs, and 3 point pinch: Right: 11 lbs, Left: Pt. Unable to assume 3pt. Pinch position  COORDINATION: 9 Hole Peg test: Right: placement/removal of 5 pegs in 1 min. & 5 sec; Left: 50 sec Pt. Has difficulty seeing items in the dish on the right side. Pt. Kept moving the vertical pegs between holes with the right  SENSATION: Light touch: WFL Proprioception: Impaired   EDEMA: Intact   MUSCLE TONE: RUE: Mild flexor tone. Wife reports right hand presents with flexion in the right hand  COGNITION: Overall cognitive status:  Pt.'s wife reports noticing the Pt. has difficulty with numbers, and letters.  VISION:    Wife reports the Pt. frequently misses items on the right, and bumping into obstacle on the right   VISION ASSESSMENT: To be further assessed in functional context  11/24/22: impaired smooth pursuits all quadrants, requiring additional head turns; impaired saccades (slow pace, requiring head turns).  Visual fields appear grossly intact, but difficult to assess d/t decreased attention and ability to consistently follow a 2 step command. Moderate-severe R sided inattention noted.  Pt. Has a neuro optometrist appointment scheduled for June.   PERCEPTION:  TBD  PRAXIS: Impaired: Motor planning  TODAY'S TREATMENT:                                                                                                                              DATE: 12/15/2022   Therapeutic Activities:  Pt. worked on visual scanning activities by completing 2 simple mazes on paper and  participating in card activity. Pt. Scanned across left to right across the tabletop identifying different card numbers/letters out of a series of cards lined up from midline to of his far right x 5 trials. Pt. Worked on visual memory by participating in card matching activity with 6 cards (2 pairs). Pt was able to identify 3 matches with verbal cues. Pt. required increased time to complete scanning tasks.  Pt. worked on Therapist, sports patterns  using the [employer] requiring step-by-step visual, and verbal cues.  PATIENT EDUCATION: Education details: Increasing R sided visual attention: family to sit to the R of patient at kitchen table; place ADL supplies to pt's R for set up and provide reminders as needed for head turns (ie remote control, cup, toothbrush) Person educated: Patient, spouse Education method: explanation, vc Education comprehension: verbalized understanding and returned demonstration  HOME EXERCISE PROGRAM: Turquoise theraputty; recommended solitaire for increasing R sided visual attention  GOALS: Goals reviewed with patient?  Yes   SHORT TERM GOALS: Target date: 12/27/2022  Pt. Will require Supervision for bilateral UE HEPs.  Baseline: Eval: No current HEP  Goal status: INITIAL  LONG TERM GOALS: Target date: 02/07/2023    Pt. Will increase FOTO score by 2 points for Pt. perceived improvement with assessment specific ADLs, and IADLs  Baseline: Eval:  FOTO score: 53 TR score: 55 Goal status: INITIAL  2.  Pt. Will increase bilateral UE strength by 2 mm grades overall to assist with ADLs, and IADLs Baseline: Eval: Right:Shoulder flexion 4/5, abduction 4-/5, elbow flexion 4+/5, extension 4+/5, wrist extension 4/5 Left Shoulder flexion 4+/5, abduction 4+/5, elbow flexion 5/5, extension 5/5, wrist extension: 4+/5 Goal status: INITIAL  3.  Pt. Will improve bilateral hand [facility] skills by 10 sec. In order to be able to manipulate objects during  ADLs, and IADLs Baseline: Eval: Right: Pt. Placed/removed 5 pegs in 1 min, & 5 sec. Pt. Kept moving the vertical pegs between holes with the right.  Left: 50 sec.  Goal status: INITIAL  4.  Pt. Will consistently implements visual compensatory strategies for ADLs, and IADLs Baseline: Eval: Pt. Currently does not utilize. Education to be provided. Goal status: INITIAL  5. Pt. Will improve bilateral grip strength to be able to securely hold items for ADLs, and IADLs. [place]: Right: 35#, Left: 25# Goal status: INITIAL 6. Pt. Will improve bilateral lateral pinch strength to be able to securely hold items for ADLs, and IADLs. [place]elne: Right: 13# , Left: 11# Goal status: INITIAL  ASSESSMENT:  CLINICAL IMPRESSION:  Pt. Continues to experience difficulties navigating through to completing simple mazes accurately. Pt. Showed improvement on navigating through the straight lined simple maze compared to a circle shape curved line maze. Pt. Frequently crossed over the maze path borders, and required increased cues to exit at the end of the circular maze. Pt. required fewer verbal, and visual cues when following the peg design from left to right, however had increased difficulty working in the reverse. Pt. required cues to place the container on the right side of the board, as Pt. frequently attempts to place the container to the left. Pt. Was able to identify and pick out cards from a line up from midline to the far right. however pt. required red marker as an achor placed on his R side. Pt. continues to benefit from OT services to work on improving bilateral UE strength, grip strength, pinch strength, motor control, and [facility] skills, provide activities for increasing attention to R visual field, and review visual, and cognitive compensatory strategies in order to improve, and maximize independence with ADLs, and IADL tasks.   PERFORMANCE DEFICITS: in functional skills including ADLs, IADLs, coordination,  dexterity, ROM, strength, Fine motor control, Gross motor control, decreased knowledge of use of DME, and UE functional use, cognitive skills including, and psychosocial skills including.   IMPAIRMENTS: are limiting patient from ADLs, IADLs, play, and leisure.   CO-MORBIDITIES: may have co-morbidities  that  affects occupational performance. Patient will benefit from skilled OT to address above impairments and improve overall function.  MODIFICATION OR ASSISTANCE TO COMPLETE EVALUATION: Min-Moderate modification of tasks or assist with assess necessary to complete an evaluation.  OT OCCUPATIONAL PROFILE AND HISTORY: Detailed assessment: Review of records and additional review of physical, cognitive, psychosocial history related to current functional performance.  CLINICAL DECISION MAKING: Moderate - several treatment options, min-mod task modification necessary  REHAB POTENTIAL: Good  EVALUATION COMPLEXITY: Moderate    PLAN:  OT FREQUENCY: 2x/week  OT DURATION: 12 weeks  PLANNED INTERVENTIONS: self care/ADL training, therapeutic exercise, therapeutic activity, neuromuscular re-education, manual therapy, and functional mobility training  RECOMMENDED OTHER SERVICES:  OT and ST  CONSULTED AND AGREED WITH PLAN OF CARE: Patient  PLAN FOR NEXT SESSION: Initiate Treatment  Herma Carson, OTS 12/15/2022 1:05 PM  This entire session was performed under the direct supervision and direction of a licensed therapist. I have personally read, edited, and approve of the note as written.   Olegario Messier, MS, OTR/L  12/14/2022 1:05 PM

## 2022-12-16 ENCOUNTER — Ambulatory Visit: Payer: BC Managed Care – PPO | Admitting: Physical Therapy

## 2022-12-16 DIAGNOSIS — M6281 Muscle weakness (generalized): Secondary | ICD-10-CM

## 2022-12-16 DIAGNOSIS — R2681 Unsteadiness on feet: Secondary | ICD-10-CM

## 2022-12-16 DIAGNOSIS — R278 Other lack of coordination: Secondary | ICD-10-CM

## 2022-12-16 DIAGNOSIS — R262 Difficulty in walking, not elsewhere classified: Secondary | ICD-10-CM

## 2022-12-16 DIAGNOSIS — H539 Unspecified visual disturbance: Secondary | ICD-10-CM

## 2022-12-16 NOTE — Therapy (Signed)
OUTPATIENT PHYSICAL THERAPY NEURO TREATMENT/     Patient Name: Mark Durham MRN: [id number] DOB:1950/09/02, 72 y.o., male Today's Date: 12/16/2022   [facility]P: [facility] REFERRING PROVIDER: Hope Pigeon MD   END OF SESSION:  PT End of Session - 12/16/22 1147     Visit Number 11    Number of Visits 24    Date for PT Re-Evaluation 01/26/23    Authorization Type [employer] COMM PRO    Authorization Time Period 11/03/22-01/26/23    PT Start Time 1148    PT Stop Time 1230    PT Time Calculation (min) 42 min    Equipment Utilized During Treatment Gait belt    Activity Tolerance Patient tolerated treatment well    Behavior During Therapy WFL for tasks assessed/performed             Past Medical History:  Diagnosis Date   Hypertension    Past Surgical History:  Procedure Laterality Date   TOTAL HIP ARTHROPLASTY Right    Patient Active Problem List   Diagnosis Date Noted   GERD (gastroesophageal reflux disease) 02/26/2021   S/P hip replacement, right 03/04/2020   Primary osteoarthritis of right hip 01/24/2020   Chronic pain of right knee 11/23/2019   Chronic right hip pain 11/23/2019   Osteoarthritis of knee 11/13/2018   History of adenomatous polyp of colon 02/14/2018   History of Barrett's esophagus 02/14/2018    ONSET DATE: January 2024  REFERRING DIAG: weakness due to stroke   THERAPY DIAG:  Muscle weakness (generalized)  Unsteadiness on feet  Difficulty in walking, not elsewhere classified  Other lack of coordination  Vision disturbance following CVA (cerebrovascular accident)  Rationale for Evaluation and Treatment: Rehabilitation  SUBJECTIVE:                                                                                                                                                                                             SUBJECTIVE STATEMENT: Pt's family member present and states that pt had a fall from chair last week. Pt states  that he dropped something in floor, and tried to pick it up from floor, lost his balance, and "slid" to floor. No pain or LOC from fall according to pt. Was able to get up from floor with help from family.     Pt accompanied by: significant other  PERTINENT HISTORY: Patient presents to physical therapy for weakness s/p stroke . 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 Per documentation he had a secondary stroke during the hospital stay as well as three seizures. Patient was  admitted to [facility] Acute inpatient rehabilitation from 08/10/22-08/31/22 where he was discharged home. Patient was discharged from Home health on 10/28/22 (PT, OT, and ST). PMH includes GERD, OA, R knee/hip pain, Barrett's esophagus. Is using a walker, still drags right foot. Needs assistance for transfers but can bath and dress self.   PAIN:  Are you having pain? No  PRECAUTIONS: Fall  WEIGHT BEARING RESTRICTIONS: No  FALLS: Has patient fallen in last 6 months? No  LIVING ENVIRONMENT: Lives with: lives with their family Lives in: House/apartment Stairs: Yes: External: 5 steps; on right going up and on left going up Has following equipment at home: Walker - 2 wheeled, Wheelchair (manual), Grab bars, and lifted toilet and hospital bed, shower chair   PLOF: Independent  PATIENT GOALS: walking with less AD, get back to mowing the yard, gardening, grilling   OBJECTIVE:   DIAGNOSTIC FINDINGS:  CTA of the head:   1. No emergent finding. 2. Atherosclerosis with high-grade left V4, bilateral MCA and left PCA narrowings.   Head CT:   1. No acute finding. 2. 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   CTA of the neck:   1. No emergent finding. 2. Atherosclerosis of the cervical carotids without flow reducing stenosis. There is a notable outpouching from the right ICA bulb consistent with ulcerated plaque. 3.  Partially covered 8 mm nodule in the left upper lobe. There is an internal calcification favoring granulomatous disease, but partially covered. Recommend non emergent chest CT without contrast.    COGNITION: Overall cognitive status:  safety awareness, short term memory, directions, sequencing   SENSATION: WFL LEs  COORDINATION: Nose finger: dyskinesia of RUE Heel slide: limited spatial awareness of RLE    POSTURE: rounded shoulders, posterior pelvic tilt, and weight shift left   LOWER EXTREMITY MMT:    MMT Right Eval Left Eval  Hip flexion 4 3+  Hip extension    Hip abduction 4 3-  Hip adduction 4 3-  Knee flexion 4- 3  Knee extension 4 4-  Ankle dorsiflexion 3 4  Ankle plantarflexion 3 4  Ankle inversion    Ankle eversion    (Blank rows = not tested)   TRANSFERS: Assistive device utilized: Environmental consultant - 2 wheeled  Sit to stand: CGA and Min A Stand to sit: CGA and Min A Chair to chair: Min A   GAIT: Gait pattern: step through pattern, decreased stance time- Right, and poor foot clearance- Right Distance walked: 30 ft  Assistive device utilized: Walker - 2 wheeled Level of assistance: CGA Comments: significant foot drag of RLE, forgetting of where RLE is, where it is outside of   FUNCTIONAL TESTS:  5 times sit to stand: 38.37 with UE support   10 meter walk test: 19.2 seconds with RW; dragging of R foot  Berg Balance Scale: Perform next time if possible  PATIENT SURVEYS:  FOTO 40  TODAY'S TREATMENT:  DATE: 12/16/22  6 Min Walk Test:  Instructed patient to ambulate as quickly and as safely as possible for 6 minutes using LRAD. Patient was allowed to take standing rest breaks without stopping the test, but if the patient required a sitting rest break the clock would be stopped and the test would be over.  Results: 334 feet using a RW  with CGA; performed for 4:106min prior to requesting seated rest break. Results indicate that the patient has reduced endurance with ambulation compared to age matched norms.  Age Matched Norms: 12-69 yo M: 32 F: 47, 53-79 yo M: 17 F: 471, 66-89 yo M: 417 F: 392 MDC: 58.21 meters (190.98 feet) or 50 meters (ANPTA Core Set of Outcome Measures for Adults with Neurologic Conditions, 2018)  Dynamic standing balance:  On airex:  Static stance no UE support 2x 30 sec Cross body reach to touch contralateral RW handle 2 x 5 bil no UE support Reciprocal foot tap on 6inch step performed 2 x 8, with first bout UE support and second completed without UE support   CGA provided by PT throughout session unless otherwise stated by PT, as well as cues for posture and attention to the RLE to reduce fall risk and improve attention to task.   PATIENT EDUCATION: Education details: goals, POC, HEP Pt educated throughout session about proper posture and technique with exercises. Improved exercise technique, movement at target joints, use of target muscles after min to mod verbal, visual, tactile cues.  Person educated: Patient and Spouse Education method: Explanation, Demonstration, Tactile cues, and Verbal cues Education comprehension: verbalized understanding, returned demonstration, verbal cues required, and tactile cues required  HOME EXERCISE PROGRAM: Access Code: [email or web address] URL: [email or web address] Date: 11/03/2022 Prepared by: Precious Bard  Exercises - Seated March  - 1 x daily - 7 x weekly - 2 sets - 10 reps - 5 hold - Seated Long Arc Quad  - 1 x daily - 7 x weekly - 2 sets - 10 reps - 5 hold - Seated Heel Toe Raises  - 1 x daily - 7 x weekly - 2 sets - 10 reps - 5 hold - Seated Hip Abduction  - 1 x daily - 7 x weekly - 2 sets - 10 reps - 5 hold - Seated Isometric Hip Adduction with Ball  - 1 x daily - 7 x weekly - 2 sets - 10 reps - 5 hold  GOALS: Goals reviewed with patient?  Yes  SHORT TERM GOALS: Target date: 01/17/2023      Patient will be independent in home exercise program to improve strength/mobility for better functional independence with ADLs. Baseline:4/24: HEP given  Goal status: IN PROGRESS    LONG TERM GOALS: Target date: 01/26/2023    Patient will increase FOTO score to equal to or greater than  51   to demonstrate statistically significant improvement in mobility and quality of life.   Baseline: 40 6/3: 41 Goal status: IN PROGRESS  2.  Patient (> 85 years old) will complete five times sit to stand test in < 15 seconds indicating an increased LE strength and improved balance. Baseline: 4/24: 38.37 with BUE support 6/3: 12.02 sec with UE support Goal status: IN PROGRESS  3.  Patient will increase 10 meter walk test to >1.76m/s as to improve gait speed for better community ambulation and to reduce fall risk. Baseline: 4/24: 19.2 seconds with RW; dragging of R foot  6/3: 17.175 with no AD and CGA from PT.  Goal status: IN PROGRESS  4.  Patient will increase Berg Balance score by > 6 points to demonstrate decreased fall risk during functional activities. Baseline: 4/24: see next session 5/2: 30/56 6/3: 31/56 Goal status: IN PROGRESS    ASSESSMENT:  CLINICAL IMPRESSION:  Pt put forth good effort throughout  PT treatment on this day.  Patient demonstrates decreased cardiovascular endurance and balance as evidenced by reduced distance and time tolerated on 6-minute walk test.  PT treatment focused on cardiovascular assessment and dynamic balance training.  Patient continues to demonstrate decreased attention to the right side and distracting environment, but able to correct mild loss of balance with use of upper extremity support on rolling walker appropriately and stepping strategy on this day.  Patient will benefit from continued skilled physical therapy to improve mobility, stability strength, and quality of life.  Patient's condition  has the potential to improve in response to therapy.   OBJECTIVE IMPAIRMENTS: Abnormal gait, cardiopulmonary status limiting activity, decreased activity tolerance, decreased balance, decreased cognition, decreased coordination, decreased endurance, decreased knowledge of condition, decreased knowledge of use of DME, decreased mobility, difficulty walking, decreased strength, decreased safety awareness, impaired perceived functional ability, impaired flexibility, impaired UE functional use, impaired vision/preception, improper body mechanics, and postural dysfunction.   ACTIVITY LIMITATIONS: carrying, lifting, bending, sitting, standing, squatting, sleeping, stairs, transfers, bed mobility, bathing, toileting, reach over head, hygiene/grooming, locomotion level, and caring for others  PARTICIPATION LIMITATIONS: meal prep, cleaning, laundry, medication management, personal finances, interpersonal relationship, driving, shopping, community activity, and yard work  PERSONAL FACTORS: Age, Past/current experiences, Time since onset of injury/illness/exacerbation, Transportation, and 3+ comorbidities: GERD, OA, R knee/hip pain, Barrett's esophagus  are also affecting patient's functional outcome.   REHAB POTENTIAL: Good  CLINICAL DECISION MAKING: Evolving/moderate complexity  EVALUATION COMPLEXITY: Moderate  PLAN:  PT FREQUENCY: 2x/week  PT DURATION: 12 weeks  PLANNED INTERVENTIONS: Therapeutic exercises, Therapeutic activity, Neuromuscular re-education, Balance training, Gait training, Patient/Family education, Self Care, Joint mobilization, Joint manipulation, Stair training, Vestibular training, Canalith repositioning, Visual/preceptual remediation/compensation, Orthotic/Fit training, Electrical stimulation, Wheelchair mobility training, Spinal mobilization, Cryotherapy, Moist heat, Splintting, Taping, Ultrasound, Ionotophoresis 4mg /ml Dexamethasone, Manual therapy, and Re-evaluation  PLAN FOR  NEXT SESSION:    Assess and adjust HEP Dynamic balance training endurance training Visual scanning tasks.   Grier Rocher PT, DPT  Physical Therapist - [place]  [facility]  12:57 PM 12/16/22   Note: Portions of this document were prepared using Dragon voice recognition software and although reviewed may contain unintentional dictation errors in syntax, grammar, or spelling.

## 2022-12-20 ENCOUNTER — Ambulatory Visit: Payer: BC Managed Care – PPO | Admitting: Speech Pathology

## 2022-12-20 ENCOUNTER — Ambulatory Visit: Payer: BC Managed Care – PPO | Admitting: Physical Therapy

## 2022-12-20 DIAGNOSIS — R278 Other lack of coordination: Secondary | ICD-10-CM

## 2022-12-20 DIAGNOSIS — R2681 Unsteadiness on feet: Secondary | ICD-10-CM

## 2022-12-20 DIAGNOSIS — R262 Difficulty in walking, not elsewhere classified: Secondary | ICD-10-CM

## 2022-12-20 DIAGNOSIS — M6281 Muscle weakness (generalized): Secondary | ICD-10-CM

## 2022-12-20 DIAGNOSIS — H539 Unspecified visual disturbance: Secondary | ICD-10-CM

## 2022-12-20 DIAGNOSIS — R41841 Cognitive communication deficit: Secondary | ICD-10-CM

## 2022-12-20 NOTE — Therapy (Signed)
OUTPATIENT PHYSICAL THERAPY NEURO TREATMENT     Patient Name: Mark Durham MRN: [id number] DOB:Feb 13, 1951, 72 y.o., male Today's Date: 12/20/2022   PCP: [facility] REFERRING PROVIDER: Hope Pigeon MD   END OF SESSION:  PT End of Session - 12/20/22 1317     Visit Number 12    Number of Visits 24    Date for PT Re-Evaluation 01/26/23    Authorization Type [employer]BS COMM PRO    Authorization Time Period 11/03/22-01/26/23    PT Start Time 1304    PT Stop Time 1345    PT Time Calculation (min) 41 min    Equipment Utilized During Treatment Gait belt    Activity Tolerance Patient tolerated treatment well    Behavior During Therapy WFL for tasks assessed/performed             Past Medical History:  Diagnosis Date   Hypertension    Past Surgical History:  Procedure Laterality Date   TOTAL HIP ARTHROPLASTY Right    Patient Active Problem List   Diagnosis Date Noted   GERD (gastroesophageal reflux disease) 02/26/2021   S/P hip replacement, right 03/04/2020   Primary osteoarthritis of right hip 01/24/2020   Chronic pain of right knee 11/23/2019   Chronic right hip pain 11/23/2019   Osteoarthritis of knee 11/13/2018   History of adenomatous polyp of colon 02/14/2018   History of Barrett's esophagus 02/14/2018    ONSET DATE: January 2024  REFERRING DIAG: weakness due to stroke   THERAPY DIAG:  Muscle weakness (generalized)  Unsteadiness on feet  Difficulty in walking, not elsewhere classified  Other lack of coordination  Vision disturbance following CVA (cerebrovascular accident)  Cognitive communication deficit  Rationale for Evaluation and Treatment: Rehabilitation  SUBJECTIVE:                                                                                                                                                                                             SUBJECTIVE STATEMENT: Pt reports no change since last PT session. No falls,  trips, or LOB over the weekend.     Pt accompanied by: significant other  PERTINENT HISTORY: Patient presents to physical therapy for weakness s/p stroke . 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 Per documentation he had a secondary stroke during the hospital stay as well as three seizures. Patient was admitted to [facility] Acute inpatient rehabilitation from 08/10/22-08/31/22 where he was discharged home. Patient was discharged from Home health on 10/28/22 (PT, OT, and ST). PMH includes GERD, OA, R knee/hip pain, Barrett's esophagus. Is using a walker,  still drags right foot. Needs assistance for transfers but can bath and dress self.   PAIN:  Are you having pain? No  PRECAUTIONS: Fall  WEIGHT BEARING RESTRICTIONS: No  FALLS: Has patient fallen in last 6 months? No  LIVING ENVIRONMENT: Lives with: lives with their family Lives in: House/apartment Stairs: Yes: External: 5 steps; on right going up and on left going up Has following equipment at home: Walker - 2 wheeled, Wheelchair (manual), Grab bars, and lifted toilet and hospital bed, shower chair   PLOF: Independent  PATIENT GOALS: walking with less AD, get back to mowing the yard, gardening, grilling   OBJECTIVE:   DIAGNOSTIC FINDINGS:  CTA of the head:   1. No emergent finding. 2. Atherosclerosis with high-grade left V4, bilateral MCA and left PCA narrowings.   Head CT:   1. No acute finding. 2. 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   CTA of the neck:   1. No emergent finding. 2. Atherosclerosis of the cervical carotids without flow reducing stenosis. There is a notable outpouching from the right ICA bulb consistent with ulcerated plaque. 3. Partially covered 8 mm nodule in the left upper lobe. There is an internal calcification favoring granulomatous disease, but partially covered. Recommend non emergent chest CT  without contrast.    COGNITION: Overall cognitive status:  safety awareness, short term memory, directions, sequencing   SENSATION: WFL LEs  COORDINATION: Nose finger: dyskinesia of RUE Heel slide: limited spatial awareness of RLE    POSTURE: rounded shoulders, posterior pelvic tilt, and weight shift left   LOWER EXTREMITY MMT:    MMT Right Eval Left Eval  Hip flexion 4 3+  Hip extension    Hip abduction 4 3-  Hip adduction 4 3-  Knee flexion 4- 3  Knee extension 4 4-  Ankle dorsiflexion 3 4  Ankle plantarflexion 3 4  Ankle inversion    Ankle eversion    (Blank rows = not tested)   TRANSFERS: Assistive device utilized: Environmental consultant - 2 wheeled  Sit to stand: CGA and Min A Stand to sit: CGA and Min A Chair to chair: Min A   GAIT: Gait pattern: step through pattern, decreased stance time- Right, and poor foot clearance- Right Distance walked: 30 ft  Assistive device utilized: Environmental consultant - 2 wheeled Level of assistance: CGA Comments: significant foot drag of RLE, forgetting of where RLE is, where it is outside of   FUNCTIONAL TESTS:  5 times sit to stand: 38.37 with UE support   10 meter walk test: 19.2 seconds with RW; dragging of R foot  Berg Balance Scale: Perform next time if possible  PATIENT SURVEYS:  FOTO 40  TODAY'S TREATMENT:                                                                                                                              DATE: 12/20/22  nustep, level 1-2, x  5 min with cues for improved attention to the RUE/RLE to increase ROM and symmerty throughout movements.   Gait training without AD x 80ft and 50ft x 2. Noted to have decreased step length on the right lower extremity requiring moderate cues for improved step length and heel contact throughout gait training.  Stepping over cane in floor to force increased step length on BLE. Performed x 10 bilateral lower extremity with upper extremity support on rolling walker.  Performed x 2  with no upper extremity support bilateral: Noted to have significant loss of balance requiring mod assist from PT and to prevent fall. Walker placed within arms reach and performed step over cane x 8 bilateral with min assist for adequate weight shift.  Seated NMR for BLE strength 4 pound ankle weights. Long arc quads x 10 bilateral Hip abduction over cane on floor x 10 bilateral Heel slides x 10 bilateral Sit to stand x 5 with min assist from PT for anterior weight shift.  Patient required multiple prolonged rest breaks throughout therapy session due to bilateral lower extremity fatigue and mild shortness of breath with gait training.   PATIENT EDUCATION: Education details: goals, POC, HEP Pt educated throughout session about proper posture and technique with exercises. Improved exercise technique, movement at target joints, use of target muscles after min to mod verbal, visual, tactile cues.  Person educated: Patient and Spouse Education method: Explanation, Demonstration, Tactile cues, and Verbal cues Education comprehension: verbalized understanding, returned demonstration, verbal cues required, and tactile cues required  HOME EXERCISE PROGRAM: Access Code: [email or web address] URL: [email or web address] Date: 11/03/2022 Prepared by: Precious Bard  Exercises - Seated March  - 1 x daily - 7 x weekly - 2 sets - 10 reps - 5 hold - Seated Long Arc Quad  - 1 x daily - 7 x weekly - 2 sets - 10 reps - 5 hold - Seated Heel Toe Raises  - 1 x daily - 7 x weekly - 2 sets - 10 reps - 5 hold - Seated Hip Abduction  - 1 x daily - 7 x weekly - 2 sets - 10 reps - 5 hold - Seated Isometric Hip Adduction with Ball  - 1 x daily - 7 x weekly - 2 sets - 10 reps - 5 hold  GOALS: Goals reviewed with patient? Yes  SHORT TERM GOALS: Target date: 01/17/2023      Patient will be independent in home exercise program to improve strength/mobility for better functional independence with  ADLs. Baseline:4/24: HEP given  Goal status: IN PROGRESS    LONG TERM GOALS: Target date: 01/26/2023    Patient will increase FOTO score to equal to or greater than  51   to demonstrate statistically significant improvement in mobility and quality of life.   Baseline: 40 6/3: 41 Goal status: IN PROGRESS  2.  Patient (> 44 years old) will complete five times sit to stand test in < 15 seconds indicating an increased LE strength and improved balance. Baseline: 4/24: 38.37 with BUE support 6/3: 12.02 sec with UE support Goal status: IN PROGRESS  3.  Patient will increase 10 meter walk test to >1.32m/s as to improve gait speed for better community ambulation and to reduce fall risk. Baseline: 4/24: 19.2 seconds with RW; dragging of R foot  6/3: 17.175 with no AD and CGA from PT.   Goal status: IN PROGRESS  4.  Patient will increase Berg Balance score by > 6 points to demonstrate decreased fall risk during  functional activities. Baseline: 4/24: see next session 5/2: 30/56 6/3: 31/56 Goal status: IN PROGRESS    ASSESSMENT:  CLINICAL IMPRESSION:  Pt put forth good effort throughout patient continues to demonstrate decreased step length on the right lower extremity especially with distractions.  Noted to require increased therapeutic rest breaks on this day due to fatigue.  Proxy limiting safety on this day requiring rolling walker within arms reach to perform pre-gait stepping tasks.  Patient will benefit from continued skilled physical therapy to improve mobility, stability strength, and quality of life.    OBJECTIVE IMPAIRMENTS: Abnormal gait, cardiopulmonary status limiting activity, decreased activity tolerance, decreased balance, decreased cognition, decreased coordination, decreased endurance, decreased knowledge of condition, decreased knowledge of use of DME, decreased mobility, difficulty walking, decreased strength, decreased safety awareness, impaired perceived functional  ability, impaired flexibility, impaired UE functional use, impaired vision/preception, improper body mechanics, and postural dysfunction.   ACTIVITY LIMITATIONS: carrying, lifting, bending, sitting, standing, squatting, sleeping, stairs, transfers, bed mobility, bathing, toileting, reach over head, hygiene/grooming, locomotion level, and caring for others  PARTICIPATION LIMITATIONS: meal prep, cleaning, laundry, medication management, personal finances, interpersonal relationship, driving, shopping, community activity, and yard work  PERSONAL FACTORS: Age, Past/current experiences, Time since onset of injury/illness/exacerbation, Transportation, and 3+ comorbidities: GERD, OA, R knee/hip pain, Barrett's esophagus  are also affecting patient's functional outcome.   REHAB POTENTIAL: Good  CLINICAL DECISION MAKING: Evolving/moderate complexity  EVALUATION COMPLEXITY: Moderate  PLAN:  PT FREQUENCY: 2x/week  PT DURATION: 12 weeks  PLANNED INTERVENTIONS: Therapeutic exercises, Therapeutic activity, Neuromuscular re-education, Balance training, Gait training, Patient/Family education, Self Care, Joint mobilization, Joint manipulation, Stair training, Vestibular training, Canalith repositioning, Visual/preceptual remediation/compensation, Orthotic/Fit training, Electrical stimulation, Wheelchair mobility training, Spinal mobilization, Cryotherapy, Moist heat, Splintting, Taping, Ultrasound, Ionotophoresis 4mg /ml Dexamethasone, Manual therapy, and Re-evaluation  PLAN FOR NEXT SESSION:    Assess and adjust HEP Dynamic balance training endurance training Visual scanning tasks.   Grier Rocher PT, DPT  Physical Therapist - [place]  [facility]  5:34 PM 12/20/22   Note: Portions of this document were prepared using Dragon voice recognition software and although reviewed may contain unintentional dictation errors in syntax, grammar, or spelling.

## 2022-12-21 ENCOUNTER — Encounter: Payer: BC Managed Care – PPO | Admitting: Occupational Therapy

## 2022-12-21 ENCOUNTER — Ambulatory Visit: Payer: BC Managed Care – PPO | Admitting: Physical Therapy

## 2022-12-23 ENCOUNTER — Ambulatory Visit: Payer: BC Managed Care – PPO | Admitting: Physical Therapy

## 2022-12-23 ENCOUNTER — Ambulatory Visit: Payer: BC Managed Care – PPO | Admitting: Occupational Therapy

## 2022-12-23 DIAGNOSIS — R2681 Unsteadiness on feet: Secondary | ICD-10-CM

## 2022-12-23 DIAGNOSIS — I69398 Other sequelae of cerebral infarction: Secondary | ICD-10-CM

## 2022-12-23 DIAGNOSIS — R278 Other lack of coordination: Secondary | ICD-10-CM

## 2022-12-23 DIAGNOSIS — M6281 Muscle weakness (generalized): Secondary | ICD-10-CM

## 2022-12-23 DIAGNOSIS — R262 Difficulty in walking, not elsewhere classified: Secondary | ICD-10-CM

## 2022-12-23 NOTE — Therapy (Signed)
OUTPATIENT PHYSICAL THERAPY NEURO TREATMENT     Patient Name: Mark Durham MRN: [id number] DOB:Sep 03, 1950, 72 y.o., male Today's Date: 12/23/2022   [facility]P: [facility] REFERRING PROVIDER: Hope Pigeon MD   END OF SESSION:  PT End of Session - 12/23/22 0930     Visit Number 13    Number of Visits 24    Date for PT Re-Evaluation 01/26/23    Authorization Type [employer]BS COMM PRO    Authorization Time Period 11/03/22-01/26/23    PT Start Time 0931    PT Stop Time 1015    PT Time Calculation (min) 44 min    Equipment Utilized During Treatment Gait belt    Activity Tolerance Patient tolerated treatment well    Behavior During Therapy WFL for tasks assessed/performed             Past Medical History:  Diagnosis Date   Hypertension    Past Surgical History:  Procedure Laterality Date   TOTAL HIP ARTHROPLASTY Right    Patient Active Problem List   Diagnosis Date Noted   GERD (gastroesophageal reflux disease) 02/26/2021   S/P hip replacement, right 03/04/2020   Primary osteoarthritis of right hip 01/24/2020   Chronic pain of right knee 11/23/2019   Chronic right hip pain 11/23/2019   Osteoarthritis of knee 11/13/2018   History of adenomatous polyp of colon 02/14/2018   History of Barrett's esophagus 02/14/2018    ONSET DATE: January 2024  REFERRING DIAG: weakness due to stroke   THERAPY DIAG:  Muscle weakness (generalized)  Unsteadiness on feet  Difficulty in walking, not elsewhere classified  Other lack of coordination  Rationale for Evaluation and Treatment: Rehabilitation  SUBJECTIVE:                                                                                                                                                                                             SUBJECTIVE STATEMENT: Pt reports no change since last PT session. No falls, trips, or LOB over the weekend.     Pt accompanied by: significant other  PERTINENT HISTORY:  Patient presents to physical therapy for weakness s/p stroke . 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 Per documentation he had a secondary stroke during the hospital stay as well as three seizures. Patient was admitted to [facility] Acute inpatient rehabilitation from 08/10/22-08/31/22 where he was discharged home. Patient was discharged from Home health on 10/28/22 (PT, OT, and ST). PMH includes GERD, OA, R knee/hip pain, Barrett's esophagus. Is using a walker, still drags right foot. Needs assistance for transfers but can bath  and dress self.   PAIN:  Are you having pain? No  PRECAUTIONS: Fall  WEIGHT BEARING RESTRICTIONS: No  FALLS: Has patient fallen in last 6 months? No  LIVING ENVIRONMENT: Lives with: lives with their family Lives in: House/apartment Stairs: Yes: External: 5 steps; on right going up and on left going up Has following equipment at home: Walker - 2 wheeled, Wheelchair (manual), Grab bars, and lifted toilet and hospital bed, shower chair   PLOF: Independent  PATIENT GOALS: walking with less AD, get back to mowing the yard, gardening, grilling   OBJECTIVE:   DIAGNOSTIC FINDINGS:  CTA of the head:   1. No emergent finding. 2. Atherosclerosis with high-grade left V4, bilateral MCA and left PCA narrowings.   Head CT:   1. No acute finding. 2. 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   CTA of the neck:   1. No emergent finding. 2. Atherosclerosis of the cervical carotids without flow reducing stenosis. There is a notable outpouching from the right ICA bulb consistent with ulcerated plaque. 3. Partially covered 8 mm nodule in the left upper lobe. There is an internal calcification favoring granulomatous disease, but partially covered. Recommend non emergent chest CT without contrast.    COGNITION: Overall cognitive status:  safety awareness, short term memory,  directions, sequencing   SENSATION: WFL LEs  COORDINATION: Nose finger: dyskinesia of RUE Heel slide: limited spatial awareness of RLE    POSTURE: rounded shoulders, posterior pelvic tilt, and weight shift left   LOWER EXTREMITY MMT:    MMT Right Eval Left Eval  Hip flexion 4 3+  Hip extension    Hip abduction 4 3-  Hip adduction 4 3-  Knee flexion 4- 3  Knee extension 4 4-  Ankle dorsiflexion 3 4  Ankle plantarflexion 3 4  Ankle inversion    Ankle eversion    (Blank rows = not tested)   TRANSFERS: Assistive device utilized: Environmental consultant - 2 wheeled  Sit to stand: CGA and Min A Stand to sit: CGA and Min A Chair to chair: Min A   GAIT: Gait pattern: step through pattern, decreased stance time- Right, and poor foot clearance- Right Distance walked: 30 ft  Assistive device utilized: Environmental consultant - 2 wheeled Level of assistance: CGA Comments: significant foot drag of RLE, forgetting of where RLE is, where it is outside of   FUNCTIONAL TESTS:  5 times sit to stand: 38.37 with UE support   10 meter walk test: 19.2 seconds with RW; dragging of R foot  Berg Balance Scale: Perform next time if possible  PATIENT SURVEYS:  FOTO 40  TODAY'S TREATMENT:                                                                                                                              DATE: 12/23/22  nustep, level 3, x 5 min with cues for improved attention to the RUE/RLE to  increase ROM and symmerty throughout movements.   Standing balance 1 foot on airex pad:  Moving ball on saboe tree from low to middle with RUE then return to lower rung with LUE. Performed 2 x 5 Bil with alternating LE on airex pad for each bout. Contralateral UE supported on RW.   Gait training with RW x 43ft +76ft with supervision assist. Mild decreased step length when distacted on the RLE. Additional gait training with no AD x 60ft with CGA and verbal cues to step through gait pattern, and heel contact when  fatigued for last 20 ft.   HEP review: Seated LAQ 3 sec hold  Seated Reciprocal hip march Seated Ankle PF x 10  Hip abduction x 10   Hip adduction to squeeze ball x 10   Standing:  Tandem stance with BUE support on RW 4 x 10 sec hold each  SLS with 10 sec hold; BUE supported on RW. X 4 bouts     PATIENT EDUCATION: Education details: goals, POC, HEP Pt educated throughout session about proper posture and technique with exercises. Improved exercise technique, movement at target joints, use of target muscles after min to mod verbal, visual, tactile cues.  Person educated: Patient and Spouse Education method: Explanation, Demonstration, Tactile cues, and Verbal cues Education comprehension: verbalized understanding, returned demonstration, verbal cues required, and tactile cues required  HOME EXERCISE PROGRAM:  Access Code: [email or web address] URL: [email or web address] Date: 12/23/2022 Prepared by: Grier Rocher  Exercises - Standing Tandem Balance with Counter Support  - 1 x daily - 5 x weekly - 3 sets - 4 reps - 10 hold - Standing Single Leg Stance with Counter Support  - 1 x daily - 5 x weekly - 3 sets - 4 reps - 10 hold  Access Code: [email or web address] URL: [email or web address] Date: 11/03/2022 Prepared by: Precious Bard  Exercises - Seated March  - 1 x daily - 7 x weekly - 2 sets - 10 reps - 5 hold - Seated Long Arc Quad  - 1 x daily - 7 x weekly - 2 sets - 10 reps - 5 hold - Seated Heel Toe Raises  - 1 x daily - 7 x weekly - 2 sets - 10 reps - 5 hold - Seated Hip Abduction  - 1 x daily - 7 x weekly - 2 sets - 10 reps - 5 hold - Seated Isometric Hip Adduction with Ball  - 1 x daily - 7 x weekly - 2 sets - 10 reps - 5 hold  GOALS: Goals reviewed with patient? Yes  SHORT TERM GOALS: Target date: 01/17/2023      Patient will be independent in home exercise program to improve strength/mobility for better functional independence with ADLs. Baseline:4/24: HEP  given  Goal status: IN PROGRESS    LONG TERM GOALS: Target date: 01/26/2023    Patient will increase FOTO score to equal to or greater than  51   to demonstrate statistically significant improvement in mobility and quality of life.   Baseline: 40 6/3: 41 Goal status: IN PROGRESS  2.  Patient (> 21 years old) will complete five times sit to stand test in < 15 seconds indicating an increased LE strength and improved balance. Baseline: 4/24: 38.37 with BUE support 6/3: 12.02 sec with UE support Goal status: IN PROGRESS  3.  Patient will increase 10 meter walk test to >1.38m/s as to improve gait speed for better community ambulation and to reduce fall risk. Baseline: 4/24: 19.2 seconds with RW;  dragging of R foot  6/3: 17.175 with no AD and CGA from PT.   Goal status: IN PROGRESS  4.  Patient will increase Berg Balance score by > 6 points to demonstrate decreased fall risk during functional activities. Baseline: 4/24: see next session 5/2: 30/56 6/3: 31/56 Goal status: IN PROGRESS    ASSESSMENT:  CLINICAL IMPRESSION:  Pt put forth good effort throughout PT treatment.  PT treatment focused on improved awareness of R side. Min-mod verbal cues for awareness of the R side and increased head turn to locate obstacles and with UE reaching task. PT re-assessed HEP with education of proper technique and addition of standing balance exercises with BUE support to increase neuromotor control and tolerance to standing.  Patient will benefit from continued skilled physical therapy to improve mobility, stability strength, and quality of life.    OBJECTIVE IMPAIRMENTS: Abnormal gait, cardiopulmonary status limiting activity, decreased activity tolerance, decreased balance, decreased cognition, decreased coordination, decreased endurance, decreased knowledge of condition, decreased knowledge of use of DME, decreased mobility, difficulty walking, decreased strength, decreased safety awareness, impaired  perceived functional ability, impaired flexibility, impaired UE functional use, impaired vision/preception, improper body mechanics, and postural dysfunction.   ACTIVITY LIMITATIONS: carrying, lifting, bending, sitting, standing, squatting, sleeping, stairs, transfers, bed mobility, bathing, toileting, reach over head, hygiene/grooming, locomotion level, and caring for others  PARTICIPATION LIMITATIONS: meal prep, cleaning, laundry, medication management, personal finances, interpersonal relationship, driving, shopping, community activity, and yard work  PERSONAL FACTORS: Age, Past/current experiences, Time since onset of injury/illness/exacerbation, Transportation, and 3+ comorbidities: GERD, OA, R knee/hip pain, Barrett's esophagus  are also affecting patient's functional outcome.   REHAB POTENTIAL: Good  CLINICAL DECISION MAKING: Evolving/moderate complexity  EVALUATION COMPLEXITY: Moderate  PLAN:  PT FREQUENCY: 2x/week  PT DURATION: 12 weeks  PLANNED INTERVENTIONS: Therapeutic exercises, Therapeutic activity, Neuromuscular re-education, Balance training, Gait training, Patient/Family education, Self Care, Joint mobilization, Joint manipulation, Stair training, Vestibular training, Canalith repositioning, Visual/preceptual remediation/compensation, Orthotic/Fit training, Electrical stimulation, Wheelchair mobility training, Spinal mobilization, Cryotherapy, Moist heat, Splintting, Taping, Ultrasound, Ionotophoresis 4mg /ml Dexamethasone, Manual therapy, and Re-evaluation  PLAN FOR NEXT SESSION:     Dynamic balance training endurance training Visual scanning tasks.  Variable gait training.   Grier Rocher PT, DPT  Physical Therapist - [place]  [facility]  11:01 AM 12/23/22   Note: Portions of this document were prepared using Dragon voice recognition software and although reviewed may contain unintentional dictation errors in syntax, grammar, or  spelling.

## 2022-12-25 ENCOUNTER — Encounter: Payer: Self-pay | Admitting: Occupational Therapy

## 2022-12-25 NOTE — Therapy (Signed)
OUTPATIENT OCCUPATIONAL THERAPY NEURO TREATMENT NOTE  Patient Name: Mark Durham MRN: [id number] DOB:08/03/1950, 72 y.o., male Today's Date: 12/25/2022  PCP: [facility] REFERRING PROVIDER: Hope Pigeon, MD  END OF SESSION:  OT End of Session - 12/25/22 1717     Visit Number 6    Number of Visits 24    Date for OT Re-Evaluation 02/07/23    OT Start Time 0845    OT Stop Time 0929    OT Time Calculation (min) 44 min    Equipment Utilized During Treatment transport chair    Activity Tolerance Patient tolerated treatment well    Behavior During Therapy [facility] for tasks assessed/performed            Past Medical History:  Diagnosis Date   Hypertension    Past Surgical History:  Procedure Laterality Date   TOTAL HIP ARTHROPLASTY Right    Patient Active Problem List   Diagnosis Date Noted   GERD (gastroesophageal reflux disease) 02/26/2021   S/P hip replacement, right 03/04/2020   Primary osteoarthritis of right hip 01/24/2020   Chronic pain of right knee 11/23/2019   Chronic right hip pain 11/23/2019   Osteoarthritis of knee 11/13/2018   History of adenomatous polyp of colon 02/14/2018   History of Barrett's esophagus 02/14/2018   ONSET DATE:  07/25/2022  REFERRING DIAG:  CVA  THERAPY DIAG:  Muscle weakness (generalized)  Other lack of coordination  Vision disturbance following CVA (cerebrovascular accident)  Rationale for Evaluation and Treatment: Rehabilitation  SUBJECTIVE:  SUBJECTIVE STATEMENT:  Pt reports his handwriting is not good, reports difficulty with handwriting and legibility.   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 Per chart review, Pt. had a secondary stroke during the hospital stay as well as three seizures. Patient was admitted to [facility] Acute inpatient rehabilitation from 08/10/22-08/31/22 where he was discharged home. Patient was  discharged from Home health on 10/28/22 (PT, OT, and ST). PMH includes: GERD, OA, R knee/hip pain, Barrett's esophagus.  PRECAUTIONS: None  WEIGHT BEARING RESTRICTIONS: No  PAIN:  Are you having pain? No  FALLS: Has patient fallen in last 6 months? No  LIVING ENVIRONMENT: Lives with: lives with their spouse Lives in: House/apartment Stairs: 5 steps to enter; one level home Has following equipment at home: Environmental consultant - 2 wheeled, Wheelchair (manual), shower chair, and bed side commode  PLOF: Independent  PATIENT GOALS: To get to walking  OBJECTIVE:   HAND DOMINANCE: Right  ADLs: Transfers/ambulation related to ADLs: Eating: Independent with utensils, assist with cutting food. Grooming: Independent oral care, wife assists with shaving. UB Dressing: Independent LB Dressing: independent Toileting: Independent Bathing: Once in the shower Supervision with bathing Tub Shower transfers: CGA with showere chair   IADLs: Shopping: Wife performs Light housekeeping: Wife performs most IADLs Meal Prep:  Meal preparation Community mobility: Relies on family and friends for driving. Medication management: Wife sets up, and provides medication Financial management: Wife completes Handwriting:  Pt. Wife reports Difficulty with identifying letters, and numbers   MOBILITY STATUS: Uses a rolling walker  POSTURE COMMENTS:  No Significant postural limitations  ACTIVITY TOLERANCE: Activity tolerance:  Fair  FUNCTIONAL OUTCOME MEASURES: FOTO: 53  TR score: 55  UPPER EXTREMITY ROM:    Active ROM Right Eval WFL Left Eval [facility]  Shoulder flexion    Shoulder abduction    Shoulder adduction    Shoulder extension  Shoulder internal rotation    Shoulder external rotation    Elbow flexion    Elbow extension    Wrist flexion    Wrist extension    Wrist ulnar deviation    Wrist radial deviation    Wrist pronation    Wrist supination    (Blank rows = not tested)  UPPER EXTREMITY  MMT:     MMT Right Eval 4/5 Left Eval 4+/5  Shoulder flexion 4-/5 4+/5  Shoulder abduction    Shoulder adduction    Shoulder extension    Shoulder internal rotation    Shoulder external rotation    Middle trapezius    Lower trapezius    Elbow flexion 4+/5 5/5  Elbow extension 4+/5 5/5  Wrist flexion    Wrist extension 4/5 4+/5  Wrist ulnar deviation    Wrist radial deviation    Wrist pronation    Wrist supination    (Blank rows = not tested)  HAND FUNCTION: Grip strength: Right: 35 lbs; Left: 25 lbs, Lateral pinch: Right: 13 lbs, Left: 11 lbs, and 3 point pinch: Right: 11 lbs, Left: Pt. Unable to assume 3pt. Pinch position  COORDINATION: 9 Hole Peg test: Right: placement/removal of 5 pegs in 1 min. & 5 sec; Left: 50 sec Pt. Has difficulty seeing items in the dish on the right side. Pt. Kept moving the vertical pegs between holes with the right  SENSATION: Light touch: WFL Proprioception: Impaired   EDEMA: Intact   MUSCLE TONE: RUE: Mild flexor tone. Wife reports right hand presents with flexion in the right hand  COGNITION: Overall cognitive status:  Pt.'s wife reports noticing the Pt. has difficulty with numbers, and letters.  VISION:    Wife reports the Pt. frequently misses items on the right, and bumping into obstacle on the right   VISION ASSESSMENT: To be further assessed in functional context  11/24/22: impaired smooth pursuits all quadrants, requiring additional head turns; impaired saccades (slow pace, requiring head turns).  Visual fields appear grossly intact, but difficult to assess d/t decreased attention and ability to consistently follow a 2 step command. Moderate-severe R sided inattention noted.  Pt. Has a neuro optometrist appointment scheduled for June.   PERCEPTION:  TBD  PRAXIS: Impaired: Motor planning  TODAY'S TREATMENT:                                                                                                                               DATE: 12/23/2022   Therapeutic Activities: Pt seen for visual attention and scanning activities with use of scatterpile of playing cards, pt picking up cards from a specific suit in ascending order with right hand and scanning to right, performing task well with the demands of task in sequential and predictable order.    Pt seen for Handwriting with use of regular pen then with modified grip pen.  Pt working on formulation of his name, initially he was attempting in script,  then transitioned to print to work towards letter formation, size and improved legibility.  Pt tends to crowd letters on the right side with decreased visual field and attention, pt responding well to cues to turn head to right and working towards letter size with therapist demonstration and use of lined paper for size reference.    Neuromuscular reeducation:   Small  inch pegs to place into board, some difficulty noted with manipulation of small objects. Items placed to the right to encourage right sided attention and awareness and performing by specific color for each line.   PATIENT EDUCATION: Education details: Increasing R sided visual attention: family to sit to the R of patient at kitchen table; place ADL supplies to pt's R for set up and provide reminders as needed for head turns (ie remote control, cup, toothbrush) Person educated: Patient, spouse Education method: explanation, vc Education comprehension: verbalized understanding and returned demonstration  HOME EXERCISE PROGRAM: Turquoise theraputty; recommended solitaire for increasing R sided visual attention  GOALS: Goals reviewed with patient?  Yes   SHORT TERM GOALS: Target date: 12/27/2022  Pt. Will require Supervision for bilateral UE HEPs.  Baseline: Eval: No current HEP  Goal status: INITIAL  LONG TERM GOALS: Target date: 02/07/2023    Pt. Will increase FOTO score by 2 points for Pt. perceived improvement with assessment specific ADLs, and  IADLs  Baseline: Eval:  FOTO score: 53 TR score: 55 Goal status: INITIAL  2.  Pt. Will increase bilateral UE strength by 2 mm grades overall to assist with ADLs, and IADLs Baseline: Eval: Right:Shoulder flexion 4/5, abduction 4-/5, elbow flexion 4+/5, extension 4+/5, wrist extension 4/5 Left Shoulder flexion 4+/5, abduction 4+/5, elbow flexion 5/5, extension 5/5, wrist extension: 4+/5 Goal status: INITIAL  3.  Pt. Will improve bilateral hand [facility] skills by 10 sec. In order to be able to manipulate objects during ADLs, and IADLs Baseline: Eval: Right: Pt. Placed/removed 5 pegs in 1 min, & 5 sec. Pt. Kept moving the vertical pegs between holes with the right.  Left: 50 sec.  Goal status: INITIAL  4.  Pt. Will consistently implements visual compensatory strategies for ADLs, and IADLs Baseline: Eval: Pt. Currently does not utilize. Education to be provided. Goal status: INITIAL  5. Pt. Will improve bilateral grip strength to be able to securely hold items for ADLs, and IADLs. [place]: Right: 35#, Left: 25# Goal status: INITIAL 6. Pt. Will improve bilateral lateral pinch strength to be able to securely hold items for ADLs, and IADLs. Baselne: Right: 13# , Left: 11# Goal status: INITIAL  ASSESSMENT:  CLINICAL IMPRESSION: Pt with improved performance with scanning for patterns in a predictable order and sequence.  His performance varies more when the pattern is not predictable and will miss items on the right if he is not expecting them.  He demonstrated improved letter size and legibility with use of larger grip pen, lined paper and cues.  He will need to continue to work on this task at home to become more proficient at writing his name on important papers.  Able to demonstrate manipulation of small 1/2 inch sized objects but has difficulty at times remaining on the same line when looking away and then back to the board.  Continue to work towards goals in plan of care to maximize safety and  independence in necessary daily tasks at home and in the community.   PERFORMANCE DEFICITS: in functional skills including ADLs, IADLs, coordination, dexterity, ROM, strength, Fine motor control, Gross motor  control, decreased knowledge of use of DME, and UE functional use, cognitive skills including, and psychosocial skills including.   IMPAIRMENTS: are limiting patient from ADLs, IADLs, play, and leisure.   CO-MORBIDITIES: may have co-morbidities  that affects occupational performance. Patient will benefit from skilled OT to address above impairments and improve overall function.  MODIFICATION OR ASSISTANCE TO COMPLETE EVALUATION: Min-Moderate modification of tasks or assist with assess necessary to complete an evaluation.  OT OCCUPATIONAL PROFILE AND HISTORY: Detailed assessment: Review of records and additional review of physical, cognitive, psychosocial history related to current functional performance.  CLINICAL DECISION MAKING: Moderate - several treatment options, min-mod task modification necessary  REHAB POTENTIAL: Good  EVALUATION COMPLEXITY: Moderate   PLAN:  OT FREQUENCY: 2x/week  OT DURATION: 12 weeks  PLANNED INTERVENTIONS: self care/ADL training, therapeutic exercise, therapeutic activity, neuromuscular re-education, manual therapy, and functional mobility training  RECOMMENDED OTHER SERVICES:  OT and ST  CONSULTED AND AGREED WITH PLAN OF CARE: Patient  PLAN FOR NEXT SESSION: Initiate Treatment Kayston Jodoin Cornelius Moras, OTR/L, CLT  Malaijah Houchen, OT 12/25/2022, 5:19 PM

## 2022-12-27 ENCOUNTER — Ambulatory Visit: Payer: BC Managed Care – PPO | Admitting: Physical Therapy

## 2022-12-27 ENCOUNTER — Ambulatory Visit: Payer: BC Managed Care – PPO | Admitting: Occupational Therapy

## 2022-12-27 DIAGNOSIS — H539 Unspecified visual disturbance: Secondary | ICD-10-CM

## 2022-12-27 DIAGNOSIS — R2681 Unsteadiness on feet: Secondary | ICD-10-CM

## 2022-12-27 DIAGNOSIS — R278 Other lack of coordination: Secondary | ICD-10-CM

## 2022-12-27 DIAGNOSIS — M6281 Muscle weakness (generalized): Secondary | ICD-10-CM

## 2022-12-27 DIAGNOSIS — R262 Difficulty in walking, not elsewhere classified: Secondary | ICD-10-CM

## 2022-12-27 NOTE — Therapy (Addendum)
OUTPATIENT PHYSICAL THERAPY NEURO TREATMENT     Patient Name: Mark Durham MRN: [id number] DOB:May 15, 1951, 72 y.o., male Today's Date: 12/27/2022   PCP: [facility] REFERRING PROVIDER: Hope Pigeon MD   END OF SESSION:  PT End of Session - 12/27/22 1340     Visit Number 14    Number of Visits 24    Date for PT Re-Evaluation 01/26/23    Authorization Type [employer] COMM PRO    Authorization Time Period 11/03/22-01/26/23    PT Start Time 1345    PT Stop Time 1430    PT Time Calculation (min) 45 min    Equipment Utilized During Treatment Gait belt    Activity Tolerance Patient tolerated treatment well    Behavior During Therapy WFL for tasks assessed/performed             Past Medical History:  Diagnosis Date   Hypertension    Past Surgical History:  Procedure Laterality Date   TOTAL HIP ARTHROPLASTY Right    Patient Active Problem List   Diagnosis Date Noted   GERD (gastroesophageal reflux disease) 02/26/2021   S/P hip replacement, right 03/04/2020   Primary osteoarthritis of right hip 01/24/2020   Chronic pain of right knee 11/23/2019   Chronic right hip pain 11/23/2019   Osteoarthritis of knee 11/13/2018   History of adenomatous polyp of colon 02/14/2018   History of Barrett's esophagus 02/14/2018    ONSET DATE: January 2024  REFERRING DIAG: weakness due to stroke   THERAPY DIAG:  Muscle weakness (generalized)  Other lack of coordination  Vision disturbance following CVA (cerebrovascular accident)  Unsteadiness on feet  Difficulty in walking, not elsewhere classified  Rationale for Evaluation and Treatment: Rehabilitation  SUBJECTIVE:                                                                                                                                                                                             SUBJECTIVE STATEMENT:  Pt reports no change since last PT session. No falls, trips, or LOB over the weekend.   Pt  reports that his spouse cooked ribs for him over the weekend for Father's Day.   Pt accompanied by: significant other  PERTINENT HISTORY: Patient presents to physical therapy for weakness s/p stroke . 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 Per documentation he had a secondary stroke during the hospital stay as well as three seizures. Patient was admitted to [facility] Acute inpatient rehabilitation from 08/10/22-08/31/22 where he was discharged home. Patient was discharged from Home health on 10/28/22 (PT, OT, and ST). PMH  includes GERD, OA, R knee/hip pain, Barrett's esophagus. Is using a walker, still drags right foot. Needs assistance for transfers but can bath and dress self.   PAIN:  Are you having pain? No  PRECAUTIONS: Fall  WEIGHT BEARING RESTRICTIONS: No  FALLS: Has patient fallen in last 6 months? No  LIVING ENVIRONMENT: Lives with: lives with their family Lives in: House/apartment Stairs: Yes: External: 5 steps; on right going up and on left going up Has following equipment at home: Walker - 2 wheeled, Wheelchair (manual), Grab bars, and lifted toilet and hospital bed, shower chair   PLOF: Independent  PATIENT GOALS: walking with less AD, get back to mowing the yard, gardening, grilling     OBJECTIVE:   DIAGNOSTIC FINDINGS:  CTA of the head:   1. No emergent finding. 2. Atherosclerosis with high-grade left V4, bilateral MCA and left PCA narrowings.   Head CT:   1. No acute finding. 2. 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   CTA of the neck:   1. No emergent finding. 2. Atherosclerosis of the cervical carotids without flow reducing stenosis. There is a notable outpouching from the right ICA bulb consistent with ulcerated plaque. 3. Partially covered 8 mm nodule in the left upper lobe. There is an internal calcification favoring granulomatous disease, but  partially covered. Recommend non emergent chest CT without contrast.    COGNITION: Overall cognitive status:  safety awareness, short term memory, directions, sequencing   SENSATION: WFL LEs  COORDINATION: Nose finger: dyskinesia of RUE Heel slide: limited spatial awareness of RLE    POSTURE: rounded shoulders, posterior pelvic tilt, and weight shift left   LOWER EXTREMITY MMT:    MMT Right Eval Left Eval  Hip flexion 4 3+  Hip extension    Hip abduction 4 3-  Hip adduction 4 3-  Knee flexion 4- 3  Knee extension 4 4-  Ankle dorsiflexion 3 4  Ankle plantarflexion 3 4  Ankle inversion    Ankle eversion    (Blank rows = not tested)   TRANSFERS: Assistive device utilized: Environmental consultant - 2 wheeled  Sit to stand: CGA and Min A Stand to sit: CGA and Min A Chair to chair: Min A   GAIT: Gait pattern: step through pattern, decreased stance time- Right, and poor foot clearance- Right Distance walked: 30 ft  Assistive device utilized: Environmental consultant - 2 wheeled Level of assistance: CGA Comments: significant foot drag of RLE, forgetting of where RLE is, where it is outside of   FUNCTIONAL TESTS:  5 times sit to stand: 38.37 with UE support   10 meter walk test: 19.2 seconds with RW; dragging of R foot  Berg Balance Scale: Perform next time if possible  PATIENT SURVEYS:  FOTO 40  TODAY'S TREATMENT: DATE: 12/27/22  TherEx:  Ambulation around the gym with use of the RW, x2 laps, 300'.  Pt utilized 2 standing rest breaks.  Seated LAQ, 4# AW donned, 2x10 each LE Seated Reciprocal hip march, 4# AW donned, 2x10 each LE Seated Ankle PF, 4# AW donned, 2x10 each LE Seated ankle dorsiflexion, 2x10 each LE Hip abduction into GTB, 2x10   Hip adduction into rainbow physioball, 2x10   Ambulation around the gym with use of the RW, x1 laps, 150'.  Pt utilized 1 standing rest break at end of the session.  Pt refused to ambulate to OT gym at the conclusion of the  session.  PATIENT EDUCATION: Education details: goals, POC, HEP Pt educated throughout session about proper posture and technique with exercises. Improved exercise technique, movement at target joints, use of target muscles after min to mod verbal, visual, tactile cues.  Person educated: Patient and Spouse Education method: Explanation, Demonstration, Tactile cues, and Verbal cues Education comprehension: verbalized understanding, returned demonstration, verbal cues required, and tactile cues required  HOME EXERCISE PROGRAM:  Access Code: [email or web address] URL: [email or web address] Date: 12/23/2022 Prepared by: Grier Rocher  Exercises - Standing Tandem Balance with Counter Support  - 1 x daily - 5 x weekly - 3 sets - 4 reps - 10 hold - Standing Single Leg Stance with Counter Support  - 1 x daily - 5 x weekly - 3 sets - 4 reps - 10 hold  Access Code: [email or web address] URL: [email or web address] Date: 11/03/2022 Prepared by: Precious Bard  Exercises - Seated March  - 1 x daily - 7 x weekly - 2 sets - 10 reps - 5 hold - Seated Long Arc Quad  - 1 x daily - 7 x weekly - 2 sets - 10 reps - 5 hold - Seated Heel Toe Raises  - 1 x daily - 7 x weekly - 2 sets - 10 reps - 5 hold - Seated Hip Abduction  - 1 x daily - 7 x weekly - 2 sets - 10 reps - 5 hold - Seated Isometric Hip Adduction with Ball  - 1 x daily - 7 x weekly - 2 sets - 10 reps - 5 hold  GOALS: Goals reviewed with patient? Yes  SHORT TERM GOALS: Target date: 01/17/2023  Patient will be independent in home exercise program to improve strength/mobility for better functional independence with ADLs. Baseline:4/24: HEP given  Goal status: IN PROGRESS    LONG TERM GOALS: Target date: 01/26/2023  Patient will increase FOTO score to equal to or greater than  51   to demonstrate statistically significant improvement in mobility and quality of life.   Baseline: 40 6/3: 41 Goal status: IN PROGRESS  2.   Patient (> 10 years old) will complete five times sit to stand test in < 15 seconds indicating an increased LE strength and improved balance. Baseline: 4/24: 38.37 with BUE support 6/3: 12.02 sec with UE support Goal status: IN PROGRESS  3.  Patient will increase 10 meter walk test to >1.64m/s as to improve gait speed for better community ambulation and to reduce fall risk. Baseline: 4/24: 19.2 seconds with RW; dragging of R foot  6/3: 17.175 with no AD and CGA from PT.   Goal status: IN PROGRESS  4.  Patient will increase Berg Balance score by > 6 points to demonstrate decreased fall risk during functional activities. Baseline: 4/24: see next session 5/2: 30/56 6/3: 31/56 Goal status: IN PROGRESS    ASSESSMENT:  CLINICAL IMPRESSION:   Pt puts forth Mark effort throughout the session.  Pt participated in therapeutic exercises that targeted the LE's and were utilized to improve overall strength necessary for improved balance and endurance with ambulation.  Pt did fatigue at the end and refused anymore ambulation, however was able to progress with all exercises and increase the resistance during the exercises.   Pt will continue to benefit from skilled therapy to address remaining deficits in order to improve overall QoL and return to PLOF.      OBJECTIVE IMPAIRMENTS: Abnormal gait, cardiopulmonary status limiting activity, decreased activity tolerance, decreased balance, decreased cognition, decreased coordination, decreased endurance, decreased knowledge of condition, decreased  knowledge of use of DME, decreased mobility, difficulty walking, decreased strength, decreased safety awareness, impaired perceived functional ability, impaired flexibility, impaired UE functional use, impaired vision/preception, improper body mechanics, and postural dysfunction.   ACTIVITY LIMITATIONS: carrying, lifting, bending, sitting, standing, squatting, sleeping, stairs, transfers, bed mobility, bathing,  toileting, reach over head, hygiene/grooming, locomotion level, and caring for others  PARTICIPATION LIMITATIONS: meal prep, cleaning, laundry, medication management, personal finances, interpersonal relationship, driving, shopping, community activity, and yard work  PERSONAL FACTORS: Age, Past/current experiences, Time since onset of injury/illness/exacerbation, Transportation, and 3+ comorbidities: GERD, OA, R knee/hip pain, Barrett's esophagus  are also affecting patient's functional outcome.   REHAB POTENTIAL: Good  CLINICAL DECISION MAKING: Evolving/moderate complexity  EVALUATION COMPLEXITY: Moderate  PLAN:  PT FREQUENCY: 2x/week  PT DURATION: 12 weeks  PLANNED INTERVENTIONS: Therapeutic exercises, Therapeutic activity, Neuromuscular re-education, Balance training, Gait training, Patient/Family education, Self Care, Joint mobilization, Joint manipulation, Stair training, Vestibular training, Canalith repositioning, Visual/preceptual remediation/compensation, Orthotic/Fit training, Electrical stimulation, Wheelchair mobility training, Spinal mobilization, Cryotherapy, Moist heat, Splintting, Taping, Ultrasound, Ionotophoresis 4mg /ml Dexamethasone, Manual therapy, and Re-evaluation  PLAN FOR NEXT SESSION:   Dynamic balance training endurance training Visual scanning tasks.  Variable gait training.     Nolon Bussing, PT, DPT Physical Therapist - [facility]  12/27/22, 1:41 PM

## 2022-12-27 NOTE — Therapy (Addendum)
OUTPATIENT OCCUPATIONAL THERAPY NEURO TREATMENT NOTE  Patient Name: Mark Durham MRN: [id number] DOB:Sep 25, 1950, 72 y.o., male Today's Date: 12/27/2022  PCP: [facility] REFERRING PROVIDER: Hope Pigeon, MD  END OF SESSION:  OT End of Session - 12/27/22 1616     Visit Number 7    Number of Visits 24    Date for OT Re-Evaluation 02/07/23    OT Start Time 1430    OT Stop Time 1515    OT Time Calculation (min) 45 min    Equipment Utilized During Treatment transport chair    Activity Tolerance Patient tolerated treatment well    Behavior During Therapy WFL for tasks assessed/performed            Past Medical History:  Diagnosis Date   Hypertension    Past Surgical History:  Procedure Laterality Date   TOTAL HIP ARTHROPLASTY Right    Patient Active Problem List   Diagnosis Date Noted   GERD (gastroesophageal reflux disease) 02/26/2021   S/P hip replacement, right 03/04/2020   Primary osteoarthritis of right hip 01/24/2020   Chronic pain of right knee 11/23/2019   Chronic right hip pain 11/23/2019   Osteoarthritis of knee 11/13/2018   History of adenomatous polyp of colon 02/14/2018   History of Barrett's esophagus 02/14/2018   ONSET DATE:  07/25/2022  REFERRING DIAG:  CVA  THERAPY DIAG:  Muscle weakness (generalized)  Rationale for Evaluation and Treatment: Rehabilitation  SUBJECTIVE:  SUBJECTIVE STATEMENT:  Pt reports that he is doing well today however hat he was tired for physical therapy session.   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 Per chart review, Pt. had a secondary stroke during the hospital stay as well as three seizures. Patient was admitted to [facility] Acute inpatient rehabilitation from 08/10/22-08/31/22 where he was discharged home. Patient was discharged from Home health on 10/28/22 (PT, OT, and ST). PMH includes: GERD, OA, R  knee/hip pain, Barrett's esophagus.  PRECAUTIONS: None  WEIGHT BEARING RESTRICTIONS: No  PAIN:  Are you having pain? No  FALLS: Has patient fallen in last 6 months? No  LIVING ENVIRONMENT: Lives with: lives with their spouse Lives in: House/apartment Stairs: 5 steps to enter; one level home Has following equipment at home: Environmental consultant - 2 wheeled, Wheelchair (manual), shower chair, and bed side commode  PLOF: Independent  PATIENT GOALS: To get to walking  OBJECTIVE:   HAND DOMINANCE: Right  ADLs: Transfers/ambulation related to ADLs: Eating: Independent with utensils, assist with cutting food. Grooming: Independent oral care, wife assists with shaving. UB Dressing: Independent LB Dressing: independent Toileting: Independent Bathing: Once in the shower Supervision with bathing Tub Shower transfers: CGA with showere chair   IADLs: Shopping: Wife performs Light housekeeping: Wife performs most IADLs Meal Prep:  Meal preparation Community mobility: Relies on family and friends for driving. Medication management: Wife sets up, and provides medication Financial management: Wife completes Handwriting:  Pt. Wife reports Difficulty with identifying letters, and numbers   MOBILITY STATUS: Uses a rolling walker  POSTURE COMMENTS:  No Significant postural limitations  ACTIVITY TOLERANCE: Activity tolerance:  Fair  FUNCTIONAL OUTCOME MEASURES: FOTO: 53  TR score: 55  UPPER EXTREMITY ROM:    Active ROM Right Eval WFL Left Eval [facility]  Shoulder flexion    Shoulder abduction    Shoulder adduction    Shoulder extension    Shoulder internal rotation  Shoulder external rotation    Elbow flexion    Elbow extension    Wrist flexion    Wrist extension    Wrist ulnar deviation    Wrist radial deviation    Wrist pronation    Wrist supination    (Blank rows = not tested)  UPPER EXTREMITY MMT:     MMT Right Eval 4/5 Left Eval 4+/5  Shoulder flexion 4-/5 4+/5   Shoulder abduction    Shoulder adduction    Shoulder extension    Shoulder internal rotation    Shoulder external rotation    Middle trapezius    Lower trapezius    Elbow flexion 4+/5 5/5  Elbow extension 4+/5 5/5  Wrist flexion    Wrist extension 4/5 4+/5  Wrist ulnar deviation    Wrist radial deviation    Wrist pronation    Wrist supination    (Blank rows = not tested)  HAND FUNCTION: Grip strength: Right: 35 lbs; Left: 25 lbs, Lateral pinch: Right: 13 lbs, Left: 11 lbs, and 3 point pinch: Right: 11 lbs, Left: Pt. Unable to assume 3pt. Pinch position  COORDINATION: 9 Hole Peg test: Right: placement/removal of 5 pegs in 1 min. & 5 sec; Left: 50 sec Pt. Has difficulty seeing items in the dish on the right side. Pt. Kept moving the vertical pegs between holes with the right  SENSATION: Light touch: WFL Proprioception: Impaired   EDEMA: Intact   MUSCLE TONE: RUE: Mild flexor tone. Wife reports right hand presents with flexion in the right hand  COGNITION: Overall cognitive status:  Pt.'s wife reports noticing the Pt. has difficulty with numbers, and letters.  VISION:    Wife reports the Pt. frequently misses items on the right, and bumping into obstacle on the right   VISION ASSESSMENT: To be further assessed in functional context  11/24/22: impaired smooth pursuits all quadrants, requiring additional head turns; impaired saccades (slow pace, requiring head turns).  Visual fields appear grossly intact, but difficult to assess d/t decreased attention and ability to consistently follow a 2 step command. Moderate-severe R sided inattention noted.  Pt. Has a neuro optometrist appointment scheduled for June.   PERCEPTION:  TBD  PRAXIS: Impaired: Motor planning  TODAY'S TREATMENT:                                                                                                                              DATE: 12/27/2022   Therapeutic Activities: Pt. worked on visual  scanning activities using a simple single letter search and visual saccades worksheet. Pt. required increased time to complete each scanning activity. Pt. Scanned across left to right across the tabletop identifying different card numbers/letters out of a series of cards lined up from midline to of his far right x 3 trials. Pt. Worked on visual memory by participating in card matching activity with 16 cards (8 pairs). Pt was able to identify 8 matches with verbal cues. Pt.  worked on Therapist, sports patterns using the [employer] requiring step-by-step visual, and verbal cues. Pt. performed visual scanning, and visual search strategies in preparation for visual compensatory strategies during ADLs, and IADL tasks.   PATIENT EDUCATION: Education details: Increasing R sided visual attention: family to sit to the R of patient at kitchen table; place ADL supplies to pt's R for set up and provide reminders as needed for head turns (ie remote control, cup, toothbrush) Person educated: Patient, spouse Education method: explanation, vc Education comprehension: verbalized understanding and returned demonstration  HOME EXERCISE PROGRAM: Turquoise theraputty; recommended solitaire for increasing R sided visual attention  GOALS: Goals reviewed with patient?  Yes   SHORT TERM GOALS: Target date: 12/27/2022  Pt. Will require Supervision for bilateral UE HEPs.  Baseline: Eval: No current HEP  Goal status: INITIAL  LONG TERM GOALS: Target date: 02/07/2023    Pt. Will increase FOTO score by 2 points for Pt. perceived improvement with assessment specific ADLs, and IADLs  Baseline: Eval:  FOTO score: 53 TR score: 55 Goal status: INITIAL  2.  Pt. Will increase bilateral UE strength by 2 mm grades overall to assist with ADLs, and IADLs Baseline: Eval: Right:Shoulder flexion 4/5, abduction 4-/5, elbow flexion 4+/5, extension 4+/5, wrist extension 4/5 Left Shoulder flexion 4+/5,  abduction 4+/5, elbow flexion 5/5, extension 5/5, wrist extension: 4+/5 Goal status: INITIAL  3.  Pt. Will improve bilateral hand [facility] skills by 10 sec. In order to be able to manipulate objects during ADLs, and IADLs Baseline: Eval: Right: Pt. Placed/removed 5 pegs in 1 min, & 5 sec. Pt. Kept moving the vertical pegs between holes with the right.  Left: 50 sec.  Goal status: INITIAL  4.  Pt. Will consistently implements visual compensatory strategies for ADLs, and IADLs Baseline: Eval: Pt. Currently does not utilize. Education to be provided. Goal status: INITIAL  5. Pt. Will improve bilateral grip strength to be able to securely hold items for ADLs, and IADLs. [place]: Right: 35#, Left: 25# Goal status: INITIAL 6. Pt. Will improve bilateral lateral pinch strength to be able to securely hold items for ADLs, and IADLs. [place]ne: Right: 13# , Left: 11# Goal status: INITIAL  ASSESSMENT:  CLINICAL IMPRESSION: Pt. required increased time to complete visual scanning tasks with 1 omission on the single letter search on the L side. When worked on visual saccades, Pt. Required increased verbal cues and also used his finger as a guide when reading letters from left to right. Pt. required fewer verbal, and visual cues when following the peg design from left to right, however had increased difficulty working in the reverse. Pt. required cues to place the container on the right side of the board, as Pt. frequently attempts to place the container to the left. Pt. continues to benefit from OT services to work on improving bilateral UE strength, grip strength, pinch strength, motor control, and [facility] skills, provide activities for increasing attention to R visual field, and review visual, and cognitive compensatory strategies in order to improve, and maximize independence with ADLs, and IADL tasks.    PERFORMANCE DEFICITS: in functional skills including ADLs, IADLs, coordination, dexterity, ROM, strength, Fine  motor control, Gross motor control, decreased knowledge of use of DME, and UE functional use, cognitive skills including, and psychosocial skills including.   IMPAIRMENTS: are limiting patient from ADLs, IADLs, play, and leisure.   CO-MORBIDITIES: may have co-morbidities  that affects occupational performance. Patient will benefit from skilled OT to  address above impairments and improve overall function.  MODIFICATION OR ASSISTANCE TO COMPLETE EVALUATION: Min-Moderate modification of tasks or assist with assess necessary to complete an evaluation.  OT OCCUPATIONAL PROFILE AND HISTORY: Detailed assessment: Review of records and additional review of physical, cognitive, psychosocial history related to current functional performance.  CLINICAL DECISION MAKING: Moderate - several treatment options, min-mod task modification necessary  REHAB POTENTIAL: Good  EVALUATION COMPLEXITY: Moderate   PLAN:  OT FREQUENCY: 2x/week  OT DURATION: 12 weeks  PLANNED INTERVENTIONS: self care/ADL training, therapeutic exercise, therapeutic activity, neuromuscular re-education, manual therapy, and functional mobility training  RECOMMENDED OTHER SERVICES:  OT and ST  CONSULTED AND AGREED WITH PLAN OF CARE: Patient  PLAN FOR NEXT SESSION: Initiate Treatment  Kamon Fahr Elige Radon, OTS  This entire session was performed under the direct supervision and direction of a licensed therapist. I have personally read, edited, and approve of the note as written.   Olegario Messier, MS, OTR/L 12/27/2022, 4:40 PM

## 2022-12-30 ENCOUNTER — Ambulatory Visit: Payer: BC Managed Care – PPO

## 2022-12-30 DIAGNOSIS — R2681 Unsteadiness on feet: Secondary | ICD-10-CM

## 2022-12-30 DIAGNOSIS — H539 Unspecified visual disturbance: Secondary | ICD-10-CM

## 2022-12-30 DIAGNOSIS — M6281 Muscle weakness (generalized): Secondary | ICD-10-CM

## 2022-12-30 DIAGNOSIS — R278 Other lack of coordination: Secondary | ICD-10-CM

## 2022-12-30 DIAGNOSIS — R262 Difficulty in walking, not elsewhere classified: Secondary | ICD-10-CM

## 2022-12-30 NOTE — Therapy (Signed)
OUTPATIENT PHYSICAL THERAPY NEURO TREATMENT     Patient Name: Mark Durham MRN: [id number] DOB:06-23-1951, 72 y.o., male Today's Date: 12/30/2022   PCP: [facility] REFERRING PROVIDER: Hope Pigeon MD   END OF SESSION:  PT End of Session - 12/30/22 1433     Visit Number 15    Number of Visits 24    Date for PT Re-Evaluation 01/26/23    Authorization Type [employer] COMM PRO    Authorization Time Period 11/03/22-01/26/23    PT Start Time 1430    PT Stop Time 1510    PT Time Calculation (min) 40 min    Equipment Utilized During Treatment Gait belt    Activity Tolerance Patient tolerated treatment well    Behavior During Therapy WFL for tasks assessed/performed             Past Medical History:  Diagnosis Date   Hypertension    Past Surgical History:  Procedure Laterality Date   TOTAL HIP ARTHROPLASTY Right    Patient Active Problem List   Diagnosis Date Noted   GERD (gastroesophageal reflux disease) 02/26/2021   S/P hip replacement, right 03/04/2020   Primary osteoarthritis of right hip 01/24/2020   Chronic pain of right knee 11/23/2019   Chronic right hip pain 11/23/2019   Osteoarthritis of knee 11/13/2018   History of adenomatous polyp of colon 02/14/2018   History of Barrett's esophagus 02/14/2018    ONSET DATE: January 2024  REFERRING DIAG: weakness due to stroke   THERAPY DIAG:  Muscle weakness (generalized)  Other lack of coordination  Unsteadiness on feet  Difficulty in walking, not elsewhere classified  Rationale for Evaluation and Treatment: Rehabilitation  SUBJECTIVE:                                                                                                                                                                                             SUBJECTIVE STATEMENT:  Pt doing fine today. No updates. Saw eye doctor for FU appointment earlier this week.   Pt accompanied by: significant other  PERTINENT HISTORY:  Patient presents to physical therapy for weakness s/p stroke . 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 Per documentation he had a secondary stroke during the hospital stay as well as three seizures. Patient was admitted to [facility] Acute inpatient rehabilitation from 08/10/22-08/31/22 where he was discharged home. Patient was discharged from Home health on 10/28/22 (PT, OT, and ST). PMH includes GERD, OA, R knee/hip pain, Barrett's esophagus. Is using a walker, still drags right foot. Needs assistance for transfers but can bath and dress  self.   PAIN:  Are you having pain? No  PRECAUTIONS: Fall  WEIGHT BEARING RESTRICTIONS: No  FALLS: Has patient fallen in last 6 months? No    PATIENT GOALS: walking with less AD, get back to mowing the yard, gardening, grilling     OBJECTIVE:   TODAY'S TREATMENT: DATE: 12/30/22  -AMB overground 119ft c RW CCW -10xSTS hands on knees (chair+ airex)  -AMB overground 19ft c RW CW -10xSTS hands on knees (chair+ airex)   -side stepping in // bars with 5lb AW bilat, 2x bilat  -standing heel raises 1x20, 2 hand support, 5lb AW bilat  -seated LAQ, 5# AW donned, 1x10 each LE -seated marching 5lb AW, cues for max height, but not very high   -airex pad stance with 90ft rainbow 2-hand toss/catch to wife x30    PATIENT EDUCATION: Education details: goals, POC, HEP Pt educated throughout session about proper posture and technique with exercises. Improved exercise technique, movement at target joints, use of target muscles after min to mod verbal, visual, tactile cues.  Person educated: Patient and Spouse Education method: Explanation, Demonstration, Tactile cues, and Verbal cues Education comprehension: verbalized understanding, returned demonstration, verbal cues required, and tactile cues required  HOME EXERCISE PROGRAM:  Access Code: [email or web address] URL: [email or web address] Date: 12/23/2022 Prepared  by: Grier Rocher  Exercises - Standing Tandem Balance with Counter Support  - 1 x daily - 5 x weekly - 3 sets - 4 reps - 10 hold - Standing Single Leg Stance with Counter Support  - 1 x daily - 5 x weekly - 3 sets - 4 reps - 10 hold  Access Code: [email or web address] URL: [email or web address] Date: 11/03/2022 Prepared by: Precious Bard  Exercises - Seated March  - 1 x daily - 7 x weekly - 2 sets - 10 reps - 5 hold - Seated Long Arc Quad  - 1 x daily - 7 x weekly - 2 sets - 10 reps - 5 hold - Seated Heel Toe Raises  - 1 x daily - 7 x weekly - 2 sets - 10 reps - 5 hold - Seated Hip Abduction  - 1 x daily - 7 x weekly - 2 sets - 10 reps - 5 hold - Seated Isometric Hip Adduction with Ball  - 1 x daily - 7 x weekly - 2 sets - 10 reps - 5 hold  GOALS: Goals reviewed with patient? Yes  SHORT TERM GOALS: Target date: 01/17/2023  Patient will be independent in home exercise program to improve strength/mobility for better functional independence with ADLs. Baseline:4/24: HEP given  Goal status: IN PROGRESS    LONG TERM GOALS: Target date: 01/26/2023  Patient will increase FOTO score to equal to or greater than  51 to demonstrate statistically significant improvement in mobility and quality of life.   Baseline: 40 6/3: 41 Goal status: IN PROGRESS  2.  Patient (> 83 years old) will complete five times sit to stand test in < 15 seconds indicating an increased LE strength and improved balance. Baseline: 4/24: 38.37 with BUE support 6/3: 12.02 sec with UE support Goal status: IN PROGRESS  3.  Patient will increase 10 meter walk test to >1.84m/s as to improve gait speed for better community ambulation and to reduce fall risk. Baseline: 4/24: 19.2 seconds with RW; dragging of R foot  6/3: 17.175 with no AD and CGA from PT.   Goal status: IN PROGRESS  4.  Patient will increase Berg Balance score by > 6 points to  demonstrate decreased fall risk during functional activities. Baseline:  4/24: see next session 5/2: 30/56 6/3: 31/56 Goal status: IN PROGRESS    ASSESSMENT:  CLINICAL IMPRESSION:  Pt remains calm, quiet, generally flat, which makes assessment of session repsonse difficult at times. Pt generally bradykinetic as is typical. Pt cued to count reps, but is not able to attend to activity of counting. Performance similar with prior sessions without remarkable changes. Pt will continue to benefit from skilled therapy to address remaining deficits in order to improve overall QoL and return to PLOF.      OBJECTIVE IMPAIRMENTS: Abnormal gait, cardiopulmonary status limiting activity, decreased activity tolerance, decreased balance, decreased cognition, decreased coordination, decreased endurance, decreased knowledge of condition, decreased knowledge of use of DME, decreased mobility, difficulty walking, decreased strength, decreased safety awareness, impaired perceived functional ability, impaired flexibility, impaired UE functional use, impaired vision/preception, improper body mechanics, and postural dysfunction.   ACTIVITY LIMITATIONS: carrying, lifting, bending, sitting, standing, squatting, sleeping, stairs, transfers, bed mobility, bathing, toileting, reach over head, hygiene/grooming, locomotion level, and caring for others  PARTICIPATION LIMITATIONS: meal prep, cleaning, laundry, medication management, personal finances, interpersonal relationship, driving, shopping, community activity, and yard work  PERSONAL FACTORS: Age, Past/current experiences, Time since onset of injury/illness/exacerbation, Transportation, and 3+ comorbidities: GERD, OA, R knee/hip pain, Barrett's esophagus  are also affecting patient's functional outcome.   REHAB POTENTIAL: Good  CLINICAL DECISION MAKING: Evolving/moderate complexity  EVALUATION COMPLEXITY: Moderate  PLAN:  PT FREQUENCY: 2x/week  PT DURATION: 12 weeks  PLANNED INTERVENTIONS: Therapeutic exercises, Therapeutic  activity, Neuromuscular re-education, Balance training, Gait training, Patient/Family education, Self Care, Joint mobilization, Joint manipulation, Stair training, Vestibular training, Canalith repositioning, Visual/preceptual remediation/compensation, Orthotic/Fit training, Electrical stimulation, Wheelchair mobility training, Spinal mobilization, Cryotherapy, Moist heat, Splintting, Taping, Ultrasound, Ionotophoresis 4mg /ml Dexamethasone, Manual therapy, and Re-evaluation  PLAN FOR NEXT SESSION:   Dynamic balance training endurance training Visual scanning tasks.  Variable gait training.   2:41 PM, 12/30/22 Rosamaria Lints, PT, DPT Physical Therapist - [facility]  Outpatient Physical Therapy- Main [facility] [phone]      12/30/22, 2:41 PM

## 2022-12-30 NOTE — Therapy (Signed)
OUTPATIENT OCCUPATIONAL THERAPY NEURO TREATMENT NOTE  Patient Name: Mark Durham MRN: [id number] DOB:Dec 26, 1950, 72 y.o., male Today's Date: 01/01/2023  PCP: [facility] REFERRING PROVIDER: Hope Pigeon, MD  END OF SESSION:  OT End of Session - 01/01/23 2043     Visit Number 8    Number of Visits 24    Date for OT Re-Evaluation 02/07/23    Progress Note Due on Visit 10    OT Start Time 1345    OT Stop Time 1430    OT Time Calculation (min) 45 min    Equipment Utilized During Treatment transport chair    Activity Tolerance Patient tolerated treatment well    Behavior During Therapy [facility] for tasks assessed/performed             Past Medical History:  Diagnosis Date   Hypertension    Past Surgical History:  Procedure Laterality Date   TOTAL HIP ARTHROPLASTY Right    Patient Active Problem List   Diagnosis Date Noted   GERD (gastroesophageal reflux disease) 02/26/2021   S/P hip replacement, right 03/04/2020   Primary osteoarthritis of right hip 01/24/2020   Chronic pain of right knee 11/23/2019   Chronic right hip pain 11/23/2019   Osteoarthritis of knee 11/13/2018   History of adenomatous polyp of colon 02/14/2018   History of Barrett's esophagus 02/14/2018   ONSET DATE:  07/25/2022  REFERRING DIAG:  CVA  THERAPY DIAG:  Muscle weakness (generalized)  Other lack of coordination  Vision disturbance following CVA (cerebrovascular accident)  Rationale for Evaluation and Treatment: Rehabilitation  SUBJECTIVE:  SUBJECTIVE STATEMENT: Spouse present this date and reported some ongoing challenges with use of R hand for self feeding.  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 Per chart review, Pt. had a secondary stroke during the hospital stay as well as three seizures. Patient was admitted to [facility] Acute inpatient rehabilitation from 08/10/22-08/31/22 where  he was discharged home. Patient was discharged from Home health on 10/28/22 (PT, OT, and ST). PMH includes: GERD, OA, R knee/hip pain, Barrett's esophagus.  PRECAUTIONS: None  WEIGHT BEARING RESTRICTIONS: No  PAIN:  Are you having pain? No  FALLS: Has patient fallen in last 6 months? No  LIVING ENVIRONMENT: Lives with: lives with their spouse Lives in: House/apartment Stairs: 5 steps to enter; one level home Has following equipment at home: Environmental consultant - 2 wheeled, Wheelchair (manual), shower chair, and bed side commode  PLOF: Independent  PATIENT GOALS: To get to walking  OBJECTIVE:   HAND DOMINANCE: Right  ADLs: Transfers/ambulation related to ADLs: Eating: Independent with utensils, assist with cutting food. Grooming: Independent oral care, wife assists with shaving. UB Dressing: Independent LB Dressing: independent Toileting: Independent Bathing: Once in the shower Supervision with bathing Tub Shower transfers: CGA with showere chair  IADLs: Shopping: Wife performs Light housekeeping: Wife performs most IADLs Meal Prep:  Meal preparation Community mobility: Relies on family and friends for driving. Medication management: Wife sets up, and provides medication Financial management: Wife completes Handwriting:  Pt. Wife reports Difficulty with identifying letters, and numbers   MOBILITY STATUS: Uses a rolling walker  POSTURE COMMENTS:  No Significant postural limitations  ACTIVITY TOLERANCE: Activity tolerance:  Fair  FUNCTIONAL OUTCOME MEASURES: FOTO: 53  TR score: 55  UPPER EXTREMITY ROM:    Active ROM Right Eval WFL Left Eval [facility]  Shoulder flexion    Shoulder abduction  Shoulder adduction    Shoulder extension    Shoulder internal rotation    Shoulder external rotation    Elbow flexion    Elbow extension    Wrist flexion    Wrist extension    Wrist ulnar deviation    Wrist radial deviation    Wrist pronation    Wrist supination    (Blank  rows = not tested)  UPPER EXTREMITY MMT:     MMT Right Eval 4/5 Left Eval 4+/5  Shoulder flexion 4-/5 4+/5  Shoulder abduction    Shoulder adduction    Shoulder extension    Shoulder internal rotation    Shoulder external rotation    Middle trapezius    Lower trapezius    Elbow flexion 4+/5 5/5  Elbow extension 4+/5 5/5  Wrist flexion    Wrist extension 4/5 4+/5  Wrist ulnar deviation    Wrist radial deviation    Wrist pronation    Wrist supination    (Blank rows = not tested)  HAND FUNCTION: Grip strength: Right: 35 lbs; Left: 25 lbs, Lateral pinch: Right: 13 lbs, Left: 11 lbs, and 3 point pinch: Right: 11 lbs, Left: Pt. Unable to assume 3pt. Pinch position  COORDINATION: 9 Hole Peg test: Right: placement/removal of 5 pegs in 1 min. & 5 sec; Left: 50 sec Pt. Has difficulty seeing items in the dish on the right side. Pt. Kept moving the vertical pegs between holes with the right  SENSATION: Light touch: WFL Proprioception: Impaired   EDEMA: Intact   MUSCLE TONE: RUE: Mild flexor tone. Wife reports right hand presents with flexion in the right hand  COGNITION: Overall cognitive status:  Pt.'s wife reports noticing the Pt. has difficulty with numbers, and letters.  VISION:    Wife reports the Pt. frequently misses items on the right, and bumping into obstacle on the right   VISION ASSESSMENT: To be further assessed in functional context  11/24/22: impaired smooth pursuits all quadrants, requiring additional head turns; impaired saccades (slow pace, requiring head turns).  Visual fields appear grossly intact, but difficult to assess d/t decreased attention and ability to consistently follow a 2 step command. Moderate-severe R sided inattention noted.  Pt. Has a neuro optometrist appointment scheduled for June.   PERCEPTION:  TBD  PRAXIS: Impaired: Motor planning  TODAY'S TREATMENT:                                                                                                                               DATE: 12/30/2022  Therapeutic Activities: Practiced with fork/knife skills using theraputty: cutting, spreading.  Practiced fork manipulation skills, working fingers up and down the fork and turning fork within fingertips to simulate repositioning fork in hand without use of L to stabilize.  Sipped water from paper cup with min vc to initiate with R hand, and min vc to keep cup level to avoid spilling.  PATIENT EDUCATION: Education details: engaging R  hand into self feeding Person educated: Patient, spouse Education method: explanation, vc, demo Education comprehension: verbalized understanding and returned demonstration, min vc  HOME EXERCISE PROGRAM: [place] theraputty; recommended solitaire for increasing R sided visual attention  GOALS: Goals reviewed with patient?  Yes   SHORT TERM GOALS: Target date: 12/27/2022  Pt. Will require Supervision for bilateral UE HEPs.  Baseline: Eval: No current HEP  Goal status: INITIAL  LONG TERM GOALS: Target date: 02/07/2023    Pt. Will increase FOTO score by 2 points for Pt. perceived improvement with assessment specific ADLs, and IADLs  Baseline: Eval:  FOTO score: 53 TR score: 55 Goal status: INITIAL  2.  Pt. Will increase bilateral UE strength by 2 mm grades overall to assist with ADLs, and IADLs Baseline: Eval: Right:Shoulder flexion 4/5, abduction 4-/5, elbow flexion 4+/5, extension 4+/5, wrist extension 4/5 Left Shoulder flexion 4+/5, abduction 4+/5, elbow flexion 5/5, extension 5/5, wrist extension: 4+/5 Goal status: INITIAL  3.  Pt. Will improve bilateral hand [facility] skills by 10 sec. In order to be able to manipulate objects during ADLs, and IADLs Baseline: Eval: Right: Pt. Placed/removed 5 pegs in 1 min, & 5 sec. Pt. Kept moving the vertical pegs between holes with the right.  Left: 50 sec.  Goal status: INITIAL  4.  Pt. Will consistently implements visual compensatory strategies for ADLs,  and IADLs Baseline: Eval: Pt. Currently does not utilize. Education to be provided. Goal status: INITIAL  5. Pt. Will improve bilateral grip strength to be able to securely hold items for ADLs, and IADLs. [place]: Right: 35#, Left: 25# Goal status: INITIAL 6. Pt. Will improve bilateral lateral pinch strength to be able to securely hold items for ADLs, and IADLs. [place]: Right: 13# , Left: 11# Goal status: INITIAL  ASSESSMENT:  CLINICAL IMPRESSION: Focus on use of R hand for self feeding.  Sipped water from paper cup with min vc to initiate with R hand, and min vc to keep cup level to avoid spilling.  Pt demonstrated good manipulation skills using R hand to reposition fork in hand and to use fork to stab putty pieces to simulate food pick up.  Min vc to utilize a larger sawing motion for more effective cutting.  Encouraged spouse to provide reminders as needed to help pt to continue to engage the R hand with self feeding.  Pt. continues to benefit from OT services to work on improving bilateral UE strength, grip strength, pinch strength, motor control, and [facility] skills, provide activities for increasing attention to R visual field, and review visual, and cognitive compensatory strategies in order to improve, and maximize independence with ADLs, and IADL tasks.   PERFORMANCE DEFICITS: in functional skills including ADLs, IADLs, coordination, dexterity, ROM, strength, Fine motor control, Gross motor control, decreased knowledge of use of DME, and UE functional use, cognitive skills including, and psychosocial skills including.   IMPAIRMENTS: are limiting patient from ADLs, IADLs, play, and leisure.   CO-MORBIDITIES: may have co-morbidities  that affects occupational performance. Patient will benefit from skilled OT to address above impairments and improve overall function.  MODIFICATION OR ASSISTANCE TO COMPLETE EVALUATION: Min-Moderate modification of tasks or assist with assess necessary to  complete an evaluation.  OT OCCUPATIONAL PROFILE AND HISTORY: Detailed assessment: Review of records and additional review of physical, cognitive, psychosocial history related to current functional performance.  CLINICAL DECISION MAKING: Moderate - several treatment options, min-mod task modification necessary  REHAB POTENTIAL: Good  EVALUATION COMPLEXITY: Moderate  PLAN:  OT  FREQUENCY: 2x/week  OT DURATION: 12 weeks  PLANNED INTERVENTIONS: self care/ADL training, therapeutic exercise, therapeutic activity, neuromuscular re-education, manual therapy, and functional mobility training  RECOMMENDED OTHER SERVICES:  OT and ST  CONSULTED AND AGREED WITH PLAN OF CARE: Patient  PLAN FOR NEXT SESSION: Initiate Treatment  Danelle Earthly, MS, OTR/L

## 2023-01-03 ENCOUNTER — Ambulatory Visit: Payer: BC Managed Care – PPO | Admitting: Physical Therapy

## 2023-01-03 ENCOUNTER — Encounter: Payer: BC Managed Care – PPO | Admitting: Speech Pathology

## 2023-01-03 ENCOUNTER — Encounter: Payer: Self-pay | Admitting: Physical Therapy

## 2023-01-03 DIAGNOSIS — R2681 Unsteadiness on feet: Secondary | ICD-10-CM

## 2023-01-03 DIAGNOSIS — M6281 Muscle weakness (generalized): Secondary | ICD-10-CM | POA: Diagnosis not present

## 2023-01-03 DIAGNOSIS — R262 Difficulty in walking, not elsewhere classified: Secondary | ICD-10-CM

## 2023-01-03 DIAGNOSIS — R278 Other lack of coordination: Secondary | ICD-10-CM

## 2023-01-03 NOTE — Therapy (Signed)
OUTPATIENT PHYSICAL THERAPY NEURO TREATMENT     Patient Name: Mark Durham MRN: [id number] DOB:24-Dec-1950, 72 y.o., male Today's Date: 01/03/2023   PCP: [facility] REFERRING PROVIDER: Hope Pigeon MD   END OF SESSION:  PT End of Session - 01/03/23 1258     Visit Number 16    Number of Visits 24    Date for PT Re-Evaluation 01/26/23    Authorization Type [employer] COMM PRO    Authorization Time Period 11/03/22-01/26/23    PT Start Time 1300    PT Stop Time 1344    PT Time Calculation (min) 44 min    Equipment Utilized During Treatment Gait belt    Activity Tolerance Patient tolerated treatment well    Behavior During Therapy WFL for tasks assessed/performed              Past Medical History:  Diagnosis Date   Hypertension    Past Surgical History:  Procedure Laterality Date   TOTAL HIP ARTHROPLASTY Right    Patient Active Problem List   Diagnosis Date Noted   GERD (gastroesophageal reflux disease) 02/26/2021   S/P hip replacement, right 03/04/2020   Primary osteoarthritis of right hip 01/24/2020   Chronic pain of right knee 11/23/2019   Chronic right hip pain 11/23/2019   Osteoarthritis of knee 11/13/2018   History of adenomatous polyp of colon 02/14/2018   History of Barrett's esophagus 02/14/2018    ONSET DATE: January 2024  REFERRING DIAG: weakness due to stroke   THERAPY DIAG:  Muscle weakness (generalized)  Other lack of coordination  Unsteadiness on feet  Difficulty in walking, not elsewhere classified  Rationale for Evaluation and Treatment: Rehabilitation  SUBJECTIVE:                                                                                                                                                                                             SUBJECTIVE STATEMENT:  Pt reports doing well today, notes no falls/stumbles, no significant changes since last session.  Pt accompanied by: significant other  PERTINENT  HISTORY: Patient presents to physical therapy for weakness s/p stroke . 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 Per documentation he had a secondary stroke during the hospital stay as well as three seizures. Patient was admitted to [facility] Acute inpatient rehabilitation from 08/10/22-08/31/22 where he was discharged home. Patient was discharged from Home health on 10/28/22 (PT, OT, and ST). PMH includes GERD, OA, R knee/hip pain, Barrett's esophagus. Is using a walker, still drags right foot. Needs assistance for transfers but can bath and dress self.  PAIN:  Are you having pain? No  PRECAUTIONS: Fall  WEIGHT BEARING RESTRICTIONS: No  FALLS: Has patient fallen in last 6 months? No    PATIENT GOALS: walking with less AD, get back to mowing the yard, gardening, grilling     OBJECTIVE:   TODAY'S TREATMENT: DATE: 01/03/23 Unless otherwise stated, CGA was provided and gait belt donned in order to ensure pt safety  NMR: - Amb 150 ft x3 with rolling walker, preformed at different times throughout session. 1st lap, Cues for R foot clearance given multiple times throughout. 2nd lap pt beginning to fatigue  towards end of lap. Final lap performed towards end of session, focused on motor-cognitive dual task and visual scanning, having pt recite letters and pictures in hallway, only able to recite ~60% of items.  -Side stepping in // bars down and back x2 with 5# AW, cues given for large steps and foot clearance, pt fatiguing quickly when side stepping to L side, slow to pick up trailing R foot.  TE: -2 x 10 STS, sitting on airex pad, hands on knees, cues for complete stand with upright posture.  -LAQ x 10 ea side with 5# AW  -Seated marching x10 ea side, multiple cues to clear foot completely off ground, able to only 2-3 reps after cue given.   PATIENT EDUCATION: Education details: goals, POC, HEP Pt educated throughout session about proper posture and  technique with exercises. Improved exercise technique, movement at target joints, use of target muscles after min to mod verbal, visual, tactile cues.  Person educated: Patient and Spouse Education method: Explanation, Demonstration, Tactile cues, and Verbal cues Education comprehension: verbalized understanding, returned demonstration, verbal cues required, and tactile cues required  HOME EXERCISE PROGRAM:  Access Code: [email or web address] URL: [email or web address] Date: 12/23/2022 Prepared by: Grier Rocher  Exercises - Standing Tandem Balance with Counter Support  - 1 x daily - 5 x weekly - 3 sets - 4 reps - 10 hold - Standing Single Leg Stance with Counter Support  - 1 x daily - 5 x weekly - 3 sets - 4 reps - 10 hold  Access Code: [email or web address] URL: [email or web address] Date: 11/03/2022 Prepared by: Precious Bard  Exercises - Seated March  - 1 x daily - 7 x weekly - 2 sets - 10 reps - 5 hold - Seated Long Arc Quad  - 1 x daily - 7 x weekly - 2 sets - 10 reps - 5 hold - Seated Heel Toe Raises  - 1 x daily - 7 x weekly - 2 sets - 10 reps - 5 hold - Seated Hip Abduction  - 1 x daily - 7 x weekly - 2 sets - 10 reps - 5 hold - Seated Isometric Hip Adduction with Ball  - 1 x daily - 7 x weekly - 2 sets - 10 reps - 5 hold  GOALS: Goals reviewed with patient? Yes  SHORT TERM GOALS: Target date: 01/17/2023  Patient will be independent in home exercise program to improve strength/mobility for better functional independence with ADLs. Baseline:4/24: HEP given  Goal status: IN PROGRESS    LONG TERM GOALS: Target date: 01/26/2023  Patient will increase FOTO score to equal to or greater than  51 to demonstrate statistically significant improvement in mobility and quality of life.   Baseline: 40 6/3: 41 Goal status: IN PROGRESS  2.  Patient (> 58 years old) will complete five times sit to stand test in < 15 seconds indicating an increased LE strength and  improved  balance. Baseline: 4/24: 38.37 with BUE support 6/3: 12.02 sec with UE support Goal status: IN PROGRESS  3.  Patient will increase 10 meter walk test to >1.52m/s as to improve gait speed for better community ambulation and to reduce fall risk. Baseline: 4/24: 19.2 seconds with RW; dragging of R foot  6/3: 17.175 with no AD and CGA from PT.   Goal status: IN PROGRESS  4.  Patient will increase Berg Balance score by > 6 points to demonstrate decreased fall risk during functional activities. Baseline: 4/24: see next session 5/2: 30/56 6/3: 31/56 Goal status: IN PROGRESS    ASSESSMENT:  CLINICAL IMPRESSION:  Patient appeared ready for treatment on this day. Today's session focused on continuation of general LE strengthening. Pt continuing to present with quiet/flat affect. Pt gait presenting bradykinetic, as are transitions. Pt was agreeable to all exercises today though fatiguing quickly with each. SPT consistently checking in with pt throughout session to ensure pt is appropriately challenged. Pt will continue to benefit from skilled therapy to address remaining deficits in order to improve overall QoL and return to PLOF.      OBJECTIVE IMPAIRMENTS: Abnormal gait, cardiopulmonary status limiting activity, decreased activity tolerance, decreased balance, decreased cognition, decreased coordination, decreased endurance, decreased knowledge of condition, decreased knowledge of use of DME, decreased mobility, difficulty walking, decreased strength, decreased safety awareness, impaired perceived functional ability, impaired flexibility, impaired UE functional use, impaired vision/preception, improper body mechanics, and postural dysfunction.   ACTIVITY LIMITATIONS: carrying, lifting, bending, sitting, standing, squatting, sleeping, stairs, transfers, bed mobility, bathing, toileting, reach over head, hygiene/grooming, locomotion level, and caring for others  PARTICIPATION LIMITATIONS: meal prep,  cleaning, laundry, medication management, personal finances, interpersonal relationship, driving, shopping, community activity, and yard work  PERSONAL FACTORS: Age, Past/current experiences, Time since onset of injury/illness/exacerbation, Transportation, and 3+ comorbidities: GERD, OA, R knee/hip pain, Barrett's esophagus  are also affecting patient's functional outcome.   REHAB POTENTIAL: Good  CLINICAL DECISION MAKING: Evolving/moderate complexity  EVALUATION COMPLEXITY: Moderate  PLAN:  PT FREQUENCY: 2x/week  PT DURATION: 12 weeks  PLANNED INTERVENTIONS: Therapeutic exercises, Therapeutic activity, Neuromuscular re-education, Balance training, Gait training, Patient/Family education, Self Care, Joint mobilization, Joint manipulation, Stair training, Vestibular training, Canalith repositioning, Visual/preceptual remediation/compensation, Orthotic/Fit training, Electrical stimulation, Wheelchair mobility training, Spinal mobilization, Cryotherapy, Moist heat, Splintting, Taping, Ultrasound, Ionotophoresis 4mg /ml Dexamethasone, Manual therapy, and Re-evaluation  PLAN FOR NEXT SESSION:   Dynamic balance training endurance training Visual scanning tasks.  Variable gait training.   Cecile Sheerer, SPT    01/03/23, 2:11 PM   I have read and reviewed the attached note and am in agreement with the documentation provided.     This licensed clinician was present and actively directing care throughout the session at all times.  Grier Rocher PT, DPT  Physical Therapist - [place]  [facility]  4:25 PM 01/03/23

## 2023-01-05 ENCOUNTER — Emergency Department: Payer: BC Managed Care – PPO

## 2023-01-05 ENCOUNTER — Observation Stay
Admission: EM | Admit: 2023-01-05 | Discharge: 2023-01-06 | Disposition: A | Discharge status: Home / Self Care | Payer: BC Managed Care – PPO | Attending: Internal Medicine | Admitting: Internal Medicine

## 2023-01-05 ENCOUNTER — Encounter: Payer: Self-pay | Admitting: Emergency Medicine

## 2023-01-05 ENCOUNTER — Other Ambulatory Visit: Payer: Self-pay

## 2023-01-05 DIAGNOSIS — G459 Transient cerebral ischemic attack, unspecified: Secondary | ICD-10-CM | POA: Diagnosis not present

## 2023-01-05 DIAGNOSIS — Z79899 Other long term (current) drug therapy: Secondary | ICD-10-CM | POA: Diagnosis not present

## 2023-01-05 DIAGNOSIS — Z7982 Long term (current) use of aspirin: Secondary | ICD-10-CM | POA: Diagnosis not present

## 2023-01-05 DIAGNOSIS — I10 Essential (primary) hypertension: Secondary | ICD-10-CM | POA: Insufficient documentation

## 2023-01-05 DIAGNOSIS — R4781 Slurred speech: Secondary | ICD-10-CM | POA: Diagnosis present

## 2023-01-05 DIAGNOSIS — R29810 Facial weakness: Secondary | ICD-10-CM | POA: Diagnosis not present

## 2023-01-05 DIAGNOSIS — H547 Unspecified visual loss: Secondary | ICD-10-CM | POA: Insufficient documentation

## 2023-01-05 DIAGNOSIS — Z8673 Personal history of transient ischemic attack (TIA), and cerebral infarction without residual deficits: Secondary | ICD-10-CM | POA: Insufficient documentation

## 2023-01-05 DIAGNOSIS — F17200 Nicotine dependence, unspecified, uncomplicated: Secondary | ICD-10-CM | POA: Insufficient documentation

## 2023-01-05 LAB — CBC
HCT: 46.6 % (ref 39.0–52.0)
Hemoglobin: 14.6 g/dL (ref 13.0–17.0)
MCH: 25.6 pg — ABNORMAL LOW (ref 26.0–34.0)
MCHC: 31.3 g/dL (ref 30.0–36.0)
MCV: 81.8 fL (ref 80.0–100.0)
Platelets: 153 10*3/uL (ref 150–400)
RBC: 5.7 MIL/uL (ref 4.22–5.81)
RDW: 14.6 % (ref 11.5–15.5)
WBC: 7.9 10*3/uL (ref 4.0–10.5)
nRBC: 0 % (ref 0.0–0.2)

## 2023-01-05 LAB — COMPREHENSIVE METABOLIC PANEL
ALT: 18 U/L (ref 0–44)
AST: 17 U/L (ref 15–41)
Albumin: 4.4 g/dL (ref 3.5–5.0)
Alkaline Phosphatase: 129 U/L — ABNORMAL HIGH (ref 38–126)
Anion gap: 10 (ref 5–15)
BUN: 16 mg/dL (ref 8–23)
CO2: 23 mmol/L (ref 22–32)
Calcium: 9.4 mg/dL (ref 8.9–10.3)
Chloride: 104 mmol/L (ref 98–111)
Creatinine, Ser: 0.99 mg/dL (ref 0.61–1.24)
GFR, Estimated: >60 mL/min (ref >60)
Glucose, Bld: 125 mg/dL — ABNORMAL HIGH (ref 70–99)
Potassium: 3.3 mmol/L — ABNORMAL LOW (ref 3.5–5.1)
Sodium: 137 mmol/L (ref 135–145)
Total Bilirubin: 0.9 mg/dL (ref 0.3–1.2)
Total Protein: 8.8 g/dL — ABNORMAL HIGH (ref 6.5–8.1)

## 2023-01-05 LAB — DIFFERENTIAL
Abs Immature Granulocytes: 0.02 10*3/uL (ref 0.00–0.07)
Basophils Absolute: 0 10*3/uL (ref 0.0–0.1)
Basophils Relative: 0 %
Eosinophils Absolute: 0.2 10*3/uL (ref 0.0–0.5)
Eosinophils Relative: 2 %
Immature Granulocytes: 0 %
Lymphocytes Relative: 36 %
Lymphs Abs: 2.8 10*3/uL (ref 0.7–4.0)
Monocytes Absolute: 0.7 10*3/uL (ref 0.1–1.0)
Monocytes Relative: 8 %
Neutro Abs: 4.2 10*3/uL (ref 1.7–7.7)
Neutrophils Relative %: 54 %

## 2023-01-05 LAB — ETHANOL: Alcohol, Ethyl (B): <10 mg/dL (ref <10)

## 2023-01-05 LAB — PROTIME-INR
INR: 1.1 (ref 0.8–1.2)
Prothrombin Time: 14.5 seconds (ref 11.4–15.2)

## 2023-01-05 LAB — APTT: aPTT: 29 seconds (ref 24–36)

## 2023-01-05 MED ORDER — SENNA 8.6 MG PO TABS
1.0000 | ORAL_TABLET | Freq: Two times a day (BID) | ORAL | Status: DC
  Administered 2023-01-05: 8.6 mg via ORAL
  Filled 2023-01-05: qty 1

## 2023-01-05 MED ORDER — POLYETHYLENE GLYCOL 3350 17 G PO PACK: 17.0000 g | PACK | Freq: Every day | ORAL | Status: DC | PRN

## 2023-01-05 MED ORDER — ENOXAPARIN SODIUM 60 MG/0.6ML IJ SOSY: 50.0000 mg | PREFILLED_SYRINGE | INTRAMUSCULAR | Status: DC

## 2023-01-05 MED ORDER — CLOPIDOGREL BISULFATE 75 MG PO TABS
300.0000 mg | ORAL_TABLET | Freq: Once | ORAL | Status: AC
  Administered 2023-01-05: 300 mg via ORAL
  Filled 2023-01-05: qty 4

## 2023-01-05 MED ORDER — HYDRALAZINE HCL 20 MG/ML IJ SOLN: 10.0000 mg | Freq: Four times a day (QID) | INTRAMUSCULAR | Status: DC | PRN

## 2023-01-05 MED ORDER — IOHEXOL 350 MG/ML SOLN
75.0000 mL | Freq: Once | INTRAVENOUS | Status: AC | PRN
  Administered 2023-01-05: 75 mL via INTRAVENOUS

## 2023-01-05 MED ORDER — ALBUTEROL SULFATE (2.5 MG/3ML) 0.083% IN NEBU: 2.5000 mg | INHALATION_SOLUTION | Freq: Four times a day (QID) | RESPIRATORY_TRACT | Status: DC

## 2023-01-05 MED ORDER — ACETAMINOPHEN 325 MG PO TABS
650.0000 mg | ORAL_TABLET | Freq: Four times a day (QID) | ORAL | Status: DC | PRN
  Administered 2023-01-06: 650 mg via ORAL
  Filled 2023-01-05: qty 2

## 2023-01-05 MED ORDER — STROKE: EARLY STAGES OF RECOVERY BOOK: Freq: Once | Status: AC

## 2023-01-05 MED ORDER — ACETAMINOPHEN 650 MG RE SUPP: 650.0000 mg | Freq: Four times a day (QID) | RECTAL | Status: DC | PRN

## 2023-01-05 NOTE — ED Provider Notes (Signed)
[facility] Provider Note   Event Date/Time   First MD Initiated Contact with Patient 01/05/23 1954     (approximate) History  Code Stroke  HPI Mark Durham is a 72 y.o. male with a past medical history of CVA 6 months prior to arrival who presents as a code stroke after EMS activated in the field due to slurred speech, right-sided facial droop, and right upper extremity drift.  Patient states that last known well time was at 1830.  Patient denies any exacerbating or relieving factors.  Patient denies any worsening or improvement. ROS: Patient currently denies any vision changes, tinnitus, difficulty speaking, facial droop, sore throat, chest pain, shortness of breath, abdominal pain, nausea/vomiting/diarrhea, dysuria, or numbness/paresthesias in any extremity   Physical Exam  Triage Vital Signs: ED Triage Vitals  Enc Vitals Group     BP      Pulse      Resp      Temp      Temp src      SpO2      Weight      Height      Head Circumference      Peak Flow      Pain Score      Pain Loc      Pain Edu?      Excl. in GC?    Most recent vital signs: Vitals:   01/05/23 2100 01/05/23 2130  BP: 127/60 138/72  Pulse: 86 85  Resp: (!) 21 19  Temp:    SpO2: 97% 96%   General: Awake, oriented x4. CV:  Good peripheral perfusion.  Resp:  Normal effort.  Abd:  No distention.  Other:  Elderly obese African-American male laying in bed in no acute distress.  NIHSS 5 ED Results / Procedures / Treatments  Labs (all labs ordered are listed, but only abnormal results are displayed) Labs Reviewed  CBC - Abnormal; Notable for the following components:      Result Value   MCH 25.6 (*)    All other components within normal limits  COMPREHENSIVE METABOLIC PANEL - Abnormal; Notable for the following components:   Potassium 3.3 (*)    Glucose, Bld 125 (*)    Total Protein 8.8 (*)    Alkaline Phosphatase 129 (*)    All other components within normal limits   PROTIME-INR  APTT  DIFFERENTIAL  ETHANOL  LIPID PANEL  HEMOGLOBIN A1C  BASIC METABOLIC PANEL  CBC  TSH  CBG MONITORING, ED   EKG ED ECG REPORT I, Merwyn Katos, the attending physician, personally viewed and interpreted this ECG. Date: 01/05/2023 EKG Time: 2010 Rate: 86 Rhythm: normal sinus rhythm QRS Axis: normal Intervals: normal ST/T Wave abnormalities: normal Narrative Interpretation: no evidence of acute ischemia RADIOLOGY ED MD interpretation: CT of the head without contrast interpreted by me shows no evidence of acute abnormalities including no intracerebral hemorrhage, obvious masses, or significant edema -Agree with radiology assessment Official radiology report(s): CT ANGIO HEAD NECK W WO CM  Result Date: 01/05/2023 CLINICAL DATA:  Initial evaluation for neuro deficit, slurred speech. EXAM: CT ANGIOGRAPHY HEAD AND NECK WITH AND WITHOUT CONTRAST TECHNIQUE: Multidetector CT imaging of the head and neck was performed using the standard protocol during bolus administration of intravenous contrast. Multiplanar CT image reconstructions and MIPs were obtained to evaluate the vascular anatomy. Carotid stenosis measurements (when applicable) are obtained utilizing NASCET criteria, using the distal internal carotid diameter as the denominator. RADIATION DOSE  REDUCTION: This exam was performed according to the departmental dose-optimization program which includes automated exposure control, adjustment of the mA and/or kV according to patient size and/or use of iterative reconstruction technique. CONTRAST:  75mL OMNIPAQUE IOHEXOL 350 MG/ML SOLN COMPARISON:  CT from earlier the same day as well as prior exam from 07/31/2020. FINDINGS: CTA NECK FINDINGS Aortic arch: Partially visualized aortic arch normal. Bovine branching pattern noted. Mild atheromatous change about the arch itself. No stenosis about the origin the great vessels. Right carotid system: Right common and internal carotid  arteries are patent without dissection. Mild atheromatous irregularity about the right carotid bulb without significant stenosis. 4 mm outpouching extending from the proximal cervical right ICA, likely a small ulcerative plaque, similar to prior (series 5, image 406), similar prior. Left carotid system: Left common and internal carotid arteries are patent without dissection. Mild atheromatous change about the left carotid bulb without hemodynamically significant stenosis. Vertebral arteries: Both vertebral arteries arise from subclavian arteries. No proximal subclavian artery stenosis. Right vertebral artery dominant. Vertebral arteries are patent without significant stenosis or dissection. Skeleton: No discrete or worrisome osseous lesions. Moderate multilevel cervical spondylosis. Other neck: Mildly prominent left level 3 lymph nodes measure up to 1.6 cm (series 4, image 67), indeterminate. There is an apparent polypoid density at the posterior left hypopharynx at the level of the epiglottis (series 4, image 70). While this finding could reflect a small amount of retained secretions or retention cyst, a possible small soft tissue lesion is difficult to exclude. This was present on prior. Upper chest: Emphysema. Partially calcified 8 mm nodule at the left upper lobe is stable as compared to 2022, consistent with a benign finding. Review of the MIP images confirms the above findings CTA HEAD FINDINGS Anterior circulation: Atheromatous change about the carotid siphons bilaterally. Associated mild to moderate narrowing at the para clinoid right ICA. There is a focal severe stenosis at the contralateral left carotid siphon (series 7, image 118). A1 segments patent bilaterally. Left A1 hypoplastic. Normal anterior communicating complex. Anterior cerebral arteries patent without stenosis. M1 segments mildly irregular but patent without significant stenosis or occlusion. Severe proximal right M2 stenosis noted (series 9,  image 22). Overall, the right MCA branches are patent but attenuated as compared to the left, similar to prior. Posterior circulation: Dominant right V4 segment patent without stenosis. Left V4 segment mildly attenuated. Severe diffuse narrowing of the distal left V4 segment just prior to the vertebrobasilar junction (series 5, image 254). Basilar patent without stenosis. Superior cerebral arteries patent bilaterally. Both PCAs primarily supplied via the basilar. Atheromatous irregularity about the PCAs which remain patent to their distal aspects. Venous sinuses: Not well assessed due to arterial timing the contrast bolus. Anatomic variants: As above.  No aneurysm. Review of the MIP images confirms the above findings IMPRESSION: 1. Negative CTA for large vessel occlusion or other emergent finding. 2. Intracranial atherosclerotic disease, with most notable findings including severe proximal right M2 and distal left V4 stenoses, with additional severe stenosis at the cavernous left ICA. Right MCA branches are overall attenuated as compared to the left but remain patent. 3. 4 mm outpouching extending from the proximal cervical right ICA, likely a small ulcerative plaque, similar to prior. 4. Polypoid density at the posterior left hypopharynx at the level of the epiglottis, indeterminate. While this finding could reflect a small amount of retained secretions or retention cyst, a possible small soft tissue lesion is difficult to exclude. Correlation with direct visualization recommended. 5.  Mildly prominent left level 3 lymph nodes measure up to 1.6 cm, indeterminate. Aortic Atherosclerosis (ICD10-I70.0) and Emphysema (ICD10-J43.9). Electronically Signed   By: Rise Mu M.D.   On: 01/05/2023 22:23   CT HEAD CODE STROKE WO CONTRAST  Result Date: 01/05/2023 CLINICAL DATA:  Code stroke. Initial evaluation for neuro deficit, stroke. Left-sided deficits. EXAM: CT HEAD WITHOUT CONTRAST TECHNIQUE: Contiguous  axial images were obtained from the base of the skull through the vertex without intravenous contrast. RADIATION DOSE REDUCTION: This exam was performed according to the departmental dose-optimization program which includes automated exposure control, adjustment of the mA and/or kV according to patient size and/or use of iterative reconstruction technique. COMPARISON:  Prior study from 07/31/2020. FINDINGS: Brain: Age-related cerebral atrophy with chronic small vessel ischemic disease. No acute intracranial hemorrhage. No acute large vessel territory infarct. No mass lesion, midline shift or mass effect. Diffuse ventricular prominence, somewhat out of proportion to cortical sulcation. While this finding could be related atrophy, a degree of NPH could be contributory. No extra-axial fluid collection. Vascular: No convincing abnormal hyperdense vessel. Scattered vascular calcifications noted within the carotid siphons. Skull: Scalp soft tissues and calvarium demonstrate no acute finding. Sinuses/Orbits: Prior ocular lens replacement on the right. Globes orbital soft tissues demonstrate no acute finding. Paranasal sinuses are largely clear. No mastoid effusion. Other: None. ASPECTS [facility] Stroke Program Early CT Score) - Ganglionic level infarction (caudate, lentiform nuclei, internal capsule, insula, M1-M3 cortex): 7 - Supraganglionic infarction (M4-M6 cortex): 3 Total score (0-10 with 10 being normal): 10 IMPRESSION: 1. No acute intracranial abnormality. 2. ASPECTS is 10. 3. Age-related cerebral atrophy with chronic small vessel ischemic disease. 4. Diffuse ventricular prominence somewhat out of proportion to cortical sulcation. While this finding could be related underlying atrophy, a degree of NPH could be contributory, and could be considered in the correct clinical setting. Critical Value/emergent results were called by telephone at the time of interpretation on 01/05/2023 at 8:11 pm to provider [facility] ,  who verbally acknowledged these results. Electronically Signed   By: Rise Mu M.D.   On: 01/05/2023 20:16   PROCEDURES: Critical Care performed: No .1-3 Lead EKG Interpretation  Performed by: Merwyn Katos, MD Authorized by: Merwyn Katos, MD     Interpretation: normal     ECG rate:  71   ECG rate assessment: normal     Rhythm: sinus rhythm     Ectopy: none     Conduction: normal    MEDICATIONS ORDERED IN ED: Medications  clopidogrel (PLAVIX) tablet 300 mg (has no administration in time range)   stroke: early stages of recovery book (has no administration in time range)  acetaminophen (TYLENOL) tablet 650 mg (has no administration in time range)    Or  acetaminophen (TYLENOL) suppository 650 mg (has no administration in time range)  albuterol (PROVENTIL) (2.5 MG/3ML) 0.083% nebulizer solution 2.5 mg (has no administration in time range)  hydrALAZINE (APRESOLINE) injection 10 mg (has no administration in time range)  enoxaparin (LOVENOX) injection 50 mg (has no administration in time range)  senna (SENOKOT) tablet 8.6 mg (has no administration in time range)  polyethylene glycol (MIRALAX / GLYCOLAX) packet 17 g (has no administration in time range)  iohexol (OMNIPAQUE) 350 MG/ML injection 75 mL (75 mLs Intravenous Contrast Given 01/05/23 2018)   IMPRESSION / MDM / ASSESSMENT AND PLAN / ED COURSE  I reviewed the triage vital signs and the nursing notes.  The patient is on the cardiac monitor to evaluate for evidence of arrhythmia and/or significant heart rate changes. Patient's presentation is most consistent with acute presentation with potential threat to life or bodily function. Stroke alert PMH risk factors: CVA, hyperlipidemia, hypertension Neurologic Deficits: Dysarthria, right-sided facial droop, right upper extremity weakness Last known Well Time: 1830 NIH Stroke Score: 5 Given History and Exam I have lower suspicion for  infectious etiology, neurologic changes secondary to toxicologic ingestion, seizure, complex migraine. Presentation concerning for possible stroke requiring workup.  Workup: Labs: POC glucose, CBC, BMP, LFTs, Troponin, PT/INR, PTT, Type and Screen Other Diagnostics: ECG, CXR, non-contrast head CT followed by CTA brain and neck (to r/o large vessel occlusion amenable to thrombectomy) Interventions: Patient low on NIH scale and after discussion about tPA, patient opted out  Consult: Neurology. Discussed with Dr. Karalee Height Prey regarding patients neurological symptoms and last well-known time and eligibility for TPA criteria. Disposition: Admission to medicine.   FINAL CLINICAL IMPRESSION(S) / ED DIAGNOSES   Final diagnoses:  Slurred speech  Facial droop   Rx / DC Orders   ED Discharge Orders     None      Note:  This document was prepared using Dragon voice recognition software and may include unintentional dictation errors.   Merwyn Katos, MD 01/05/23 [phone]

## 2023-01-05 NOTE — Progress Notes (Signed)
CTA results sent to Dr. Jill Alexanders de Prey @2230 

## 2023-01-05 NOTE — H&P (Incomplete)
[facility] History and Physical  ALYXANDER KOLLMANN ZOX:[id number] DOB: 07/08/1951 DOA: 01/05/2023 PCP: [facility]  Admitted from: Home Chief Complaint: Strokelike symptoms  History of Present Illness: Mark Durham is a 72 y.o. 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  He has had residual physical and mild cognitive deficits since then.  He has right-sided weakness for which she follows up with outpatient PT.  He had right-sided visual loss as well with the stroke. He lives with his wife.  According to his wife, patient has physical improved and is able to ambulate inside the house without any assistive device. Around 7 PM this evening, patient complained of sudden onset headache.  Wife also noticed slurred speech and more pronounced right-sided facial droop.  When he stood up to walk, she noticed that he was weaker and limping on the right side.  She gave him aspirin.  Called EMS and brought to the ED.  In the ED, patient was hemodynamically stable CT head did not show any acute intracranial abnormality.  It showed chronic small vessel ischemic disease, diffuse ventricular prominence somewhat out of proportion to cortical sulcation, findings suspicious for NPH. CT angio of head did not show any large vessel occlusion or other emergent finding.  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  4 mm outpouching extending from the proximal cervical right ICA likely a small ulcerated plaque, similar to prior scan from 2022.  Seen by teleneurology. Neurology exam noted mild left facial weakness, mild dysarthria and possible left hemibody hypoesthesia.  Because of persistent symptoms, patient was offered tPA which he refused. Aspirin had already been given prior to presentation.  Plavix loading  dose was given Hospitalist service consulted for TIA workup.    Recently seen by his neurologist in May 2024, tapered off Keppra and continued on Vimpat at 200 mg twice daily.    At the time of my evaluation, ***  Review of Systems:  All systems were reviewed and were negative unless otherwise mentioned in the HPI   Past medical history: Past Medical History:  Diagnosis Date  . Hypertension     Past surgical history: Past Surgical History:  Procedure Laterality Date  . TOTAL HIP ARTHROPLASTY Right     Social History:  reports that he has been smoking. He has been exposed to tobacco smoke. He has never used smokeless tobacco. He reports that he does not drink alcohol and does not use drugs.  Allergies:  No Known Allergies Patient has no known allergies.   Family history:  History reviewed. No pertinent family history. ***  Home Meds: Prior to Admission medications   Medication Sig Start Date End Date Taking? Authorizing Provider  acetaminophen (TYLENOL) 500 MG tablet Take 1,000 mg by mouth every 6 (six) hours as needed for moderate pain (as needed for pain). Take 2 tablets 3x per day as needed for pain    [provider]  amLODipine (NORVASC) 10 MG tablet Take 10 mg by mouth daily.    [provider]  amLODipine (NORVASC) 5 MG tablet Take 1 tablet (5 mg total) by mouth daily. Patient not taking: Reported on 11/08/2022 11/17/21 11/17/22  Gilles Chiquito, MD  aspirin EC 81 MG tablet Take 81 mg by mouth daily. Swallow whole.    [provider]  atorvastatin (LIPITOR) 80 MG tablet  Take 80 mg by mouth daily. Take 1 tablet by mouth daily    [provider]  diclofenac Sodium (VOLTAREN) 1 % GEL Apply 2 g topically in the morning, at noon, and at bedtime. Apply 2g topically 3x daily as needed for arthritis left hip    [provider]  Lacosamide 100 MG TABS Take 100 mg by mouth 2 (two) times daily. Take 1 tablet (100mg  total) by mouth 2  times a day Patient not taking: Reported on 11/15/2022    [provider]  levETIRAcetam (KEPPRA) 1000 MG tablet Take 2,000 mg by mouth 2 (two) times daily. Take 2 tablets by mouth 2x per day.    [provider]  melatonin 3 MG TABS tablet Take 3 mg by mouth at bedtime.    [provider]  omeprazole (PRILOSEC OTC) 20 MG tablet Take 20 mg by mouth daily. Take 1 tablet by mouth daily as needed.    [provider]  polyethylene glycol (MIRALAX / GLYCOLAX) 17 g packet Take 17 g by mouth daily. Drink by mouth daily as needed.    [provider]  senna (SENOKOT) 8.6 MG TABS tablet Take 2 tablets by mouth at bedtime. Take 2 tablets by mouth nightly.    [provider]  tadalafil (CIALIS) 20 MG tablet Take 1 tablet (20 mg total) by mouth daily as needed for erectile dysfunction (1 hour prior to sexual activity). Patient not taking: Reported on 11/15/2022 11/18/21   Sondra Come, MD    Physical Exam: Vitals:   01/05/23 2005 01/05/23 2045 01/05/23 2100 01/05/23 2130  BP:  (!) 151/54 127/60 138/72  Pulse:  86 86 85  Resp:  19 (!) 21 19  Temp:      TempSrc:      SpO2:  95% 97% 96%  Weight: 98.2 kg     Height:       Wt Readings from Last 3 Encounters:  01/05/23 98.2 kg  11/18/21 95.3 kg  04/15/21 93 kg   Body mass index is 31.06 kg/m.  General exam: *** Skin: No rashes, lesions or ulcers. HEENT: Atraumatic, normocephalic, no obvious bleeding Lungs: *** CVS: *** GI/Abd *** CNS: *** Psychiatry: *** Extremities: ***   ------------------------------------------------------------------------------------------------------ Assessment/Plan: Principal Problem:   TIA (transient ischemic attack)  Acute CVA Presented with slurred speech, right facial droop Continues to have mild left facial weakness, dysarthria Refused tPA CT head without acute intracranial abnormality.  CT angio head without large vessel occlusion However, patient has  chronic small vessel ischemic disease as well as multifocal intracranial atherosclerotic disease TIA workup ordered -MRI brain, echo with bubble, A1c, lipid panel, PT OT eval, neurology consult Swallow screen to be done in the ED. EMS gave aspirin.  Loaded with Plavix in the ED.  Essential hypertension PTA meds-amlodipine,  HLD Lipitor  History of seizure disorder PTA meds-Keppra 2000 mg twice daily  Left hypopharyngeal growth CT head also showed polypoid density at the posterior left hypopharynx at the level of the epiglottis, indeterminate. While this finding could reflect a small amount of retained secretions or retention cyst, a possible small soft tissue lesion is difficult to exclude. Correlation with direct visualization recommended by radiology.  Needs referral for ENT evaluation as an outpatient  Mobility: ***  Goals of care   Code Status: Full Code    DVT prophylaxis:***     Antimicrobials: *** Fluid: *** Consultants: *** Family Communication: ***  Dispo: The patient is from: {From:23814}  Anticipated d/c is to: {To:23815}  Diet: Diet Order             Diet NPO time specified  Diet effective now                  ***  ------------------------------------------------------------------------------------- Severity of Illness: {Observation/Inpatient:21159} ------------------------------------------------------------------------------------- Recon *** Abx***    Labs on Admission:   CBC: Recent Labs  Lab 01/05/23 2005  WBC 7.9  NEUTROABS 4.2  HGB 14.6  HCT 46.6  MCV 81.8  PLT 153    Basic Metabolic Panel: Recent Labs  Lab 01/05/23 2005  NA 137  K 3.3*  CL 104  CO2 23  GLUCOSE 125*  BUN 16  CREATININE 0.99  CALCIUM 9.4    Liver Function Tests: Recent Labs  Lab 01/05/23 2005  AST 17  ALT 18  ALKPHOS 129*  BILITOT 0.9  PROT 8.8*  ALBUMIN 4.4   No results for input(s): "LIPASE", "AMYLASE" in the last 168  hours. No results for input(s): "AMMONIA" in the last 168 hours.  Cardiac Enzymes: No results for input(s): "CKTOTAL", "CKMB", "CKMBINDEX", "TROPONINI" in the last 168 hours.  BNP (last 3 results) No results for input(s): "BNP" in the last 8760 hours.  ProBNP (last 3 results) No results for input(s): "PROBNP" in the last 8760 hours.  CBG: No results for input(s): "GLUCAP" in the last 168 hours.  Lipase  No results found for: "LIPASE"   Urinalysis    Component Value Date/Time   APPEARANCEUR Clear 04/15/2021 0908   GLUCOSEU Negative 04/15/2021 0908   BILIRUBINUR Negative 04/15/2021 0908   PROTEINUR Negative 04/15/2021 0908   NITRITE Negative 04/15/2021 0908   LEUKOCYTESUR Negative 04/15/2021 0908     Drugs of Abuse  No results found for: "LABOPIA", "COCAINSCRNUR", "LABBENZ", "AMPHETMU", "THCU", "LABBARB"    Radiological Exams on Admission: CT ANGIO HEAD NECK W WO CM  Result Date: 01/05/2023 CLINICAL DATA:  Initial evaluation for neuro deficit, slurred speech. EXAM: CT ANGIOGRAPHY HEAD AND NECK WITH AND WITHOUT CONTRAST TECHNIQUE: Multidetector CT imaging of the head and neck was performed using the standard protocol during bolus administration of intravenous contrast. Multiplanar CT image reconstructions and MIPs were obtained to evaluate the vascular anatomy. Carotid stenosis measurements (when applicable) are obtained utilizing NASCET criteria, using the distal internal carotid diameter as the denominator. RADIATION DOSE REDUCTION: This exam was performed according to the departmental dose-optimization program which includes automated exposure control, adjustment of the mA and/or kV according to patient size and/or use of iterative reconstruction technique. CONTRAST:  75mL OMNIPAQUE IOHEXOL 350 MG/ML SOLN COMPARISON:  CT from earlier the same day as well as prior exam from 07/31/2020. FINDINGS: CTA NECK FINDINGS Aortic arch: Partially visualized aortic arch normal. Bovine branching  pattern noted. Mild atheromatous change about the arch itself. No stenosis about the origin the great vessels. Right carotid system: Right common and internal carotid arteries are patent without dissection. Mild atheromatous irregularity about the right carotid bulb without significant stenosis. 4 mm outpouching extending from the proximal cervical right ICA, likely a small ulcerative plaque, similar to prior (series 5, image 406), similar prior. Left carotid system: Left common and internal carotid arteries are patent without dissection. Mild atheromatous change about the left carotid bulb without hemodynamically significant stenosis. Vertebral arteries: Both vertebral arteries arise from subclavian arteries. No proximal subclavian artery stenosis. Right vertebral artery dominant. Vertebral arteries are patent without significant stenosis or dissection. Skeleton: No discrete or worrisome osseous lesions. Moderate multilevel cervical spondylosis. Other  neck: Mildly prominent left level 3 lymph nodes measure up to 1.6 cm (series 4, image 67), indeterminate. There is an apparent polypoid density at the posterior left hypopharynx at the level of the epiglottis (series 4, image 70). While this finding could reflect a small amount of retained secretions or retention cyst, a possible small soft tissue lesion is difficult to exclude. This was present on prior. Upper chest: Emphysema. Partially calcified 8 mm nodule at the left upper lobe is stable as compared to 2022, consistent with a benign finding. Review of the MIP images confirms the above findings CTA HEAD FINDINGS Anterior circulation: Atheromatous change about the carotid siphons bilaterally. Associated mild to moderate narrowing at the para clinoid right ICA. There is a focal severe stenosis at the contralateral left carotid siphon (series 7, image 118). A1 segments patent bilaterally. Left A1 hypoplastic. Normal anterior communicating complex. Anterior cerebral  arteries patent without stenosis. M1 segments mildly irregular but patent without significant stenosis or occlusion. Severe proximal right M2 stenosis noted (series 9, image 22). Overall, the right MCA branches are patent but attenuated as compared to the left, similar to prior. Posterior circulation: Dominant right V4 segment patent without stenosis. Left V4 segment mildly attenuated. Severe diffuse narrowing of the distal left V4 segment just prior to the vertebrobasilar junction (series 5, image 254). Basilar patent without stenosis. Superior cerebral arteries patent bilaterally. Both PCAs primarily supplied via the basilar. Atheromatous irregularity about the PCAs which remain patent to their distal aspects. Venous sinuses: Not well assessed due to arterial timing the contrast bolus. Anatomic variants: As above.  No aneurysm. Review of the MIP images confirms the above findings IMPRESSION: 1. Negative CTA for large vessel occlusion or other emergent finding. 2. Intracranial atherosclerotic disease, with most notable findings including severe proximal right M2 and distal left V4 stenoses, with additional severe stenosis at the cavernous left ICA. Right MCA branches are overall attenuated as compared to the left but remain patent. 3. 4 mm outpouching extending from the proximal cervical right ICA, likely a small ulcerative plaque, similar to prior. 4. Polypoid density at the posterior left hypopharynx at the level of the epiglottis, indeterminate. While this finding could reflect a small amount of retained secretions or retention cyst, a possible small soft tissue lesion is difficult to exclude. Correlation with direct visualization recommended. 5. Mildly prominent left level 3 lymph nodes measure up to 1.6 cm, indeterminate. Aortic Atherosclerosis (ICD10-I70.0) and Emphysema (ICD10-J43.9). Electronically Signed   By: Rise Mu M.D.   On: 01/05/2023 22:23   CT HEAD CODE STROKE WO CONTRAST  Result  Date: 01/05/2023 CLINICAL DATA:  Code stroke. Initial evaluation for neuro deficit, stroke. Left-sided deficits. EXAM: CT HEAD WITHOUT CONTRAST TECHNIQUE: Contiguous axial images were obtained from the base of the skull through the vertex without intravenous contrast. RADIATION DOSE REDUCTION: This exam was performed according to the departmental dose-optimization program which includes automated exposure control, adjustment of the mA and/or kV according to patient size and/or use of iterative reconstruction technique. COMPARISON:  Prior study from 07/31/2020. FINDINGS: Brain: Age-related cerebral atrophy with chronic small vessel ischemic disease. No acute intracranial hemorrhage. No acute large vessel territory infarct. No mass lesion, midline shift or mass effect. Diffuse ventricular prominence, somewhat out of proportion to cortical sulcation. While this finding could be related atrophy, a degree of NPH could be contributory. No extra-axial fluid collection. Vascular: No convincing abnormal hyperdense vessel. Scattered vascular calcifications noted within the carotid siphons. Skull: Scalp soft tissues and  calvarium demonstrate no acute finding. Sinuses/Orbits: Prior ocular lens replacement on the right. Globes orbital soft tissues demonstrate no acute finding. Paranasal sinuses are largely clear. No mastoid effusion. Other: None. ASPECTS [facility] Stroke Program Early CT Score) - Ganglionic level infarction (caudate, lentiform nuclei, internal capsule, insula, M1-M3 cortex): 7 - Supraganglionic infarction (M4-M6 cortex): 3 Total score (0-10 with 10 being normal): 10 IMPRESSION: 1. No acute intracranial abnormality. 2. ASPECTS is 10. 3. Age-related cerebral atrophy with chronic small vessel ischemic disease. 4. Diffuse ventricular prominence somewhat out of proportion to cortical sulcation. While this finding could be related underlying atrophy, a degree of NPH could be contributory, and could be considered in the  correct clinical setting. Critical Value/emergent results were called by telephone at the time of interpretation on 01/05/2023 at 8:11 pm to provider [facility] , who verbally acknowledged these results. Electronically Signed   By: Rise Mu M.D.   On: 01/05/2023 20:16     Signed, Lorin Glass, MD [facility] 01/05/2023

## 2023-01-05 NOTE — ED Notes (Signed)
Hospitalist messaged due to pt family requesting pt be transferred to [facility]. Hospitalist states he will be by in 20 minutes to discuss situation.

## 2023-01-05 NOTE — ED Notes (Addendum)
CBG upon ED arrival: 134

## 2023-01-05 NOTE — ED Notes (Signed)
Swallow screen to be performed per Dr. Pola Corn after discussion with pt and family, facial droop is chronic from previous stroke.

## 2023-01-05 NOTE — ED Notes (Signed)
RN to CT with pt. 

## 2023-01-05 NOTE — Progress Notes (Addendum)
1951 - Code Stroke Activated mRS 2, LKWT 1830 1952 - Patient to CT 2000- Patient returned to room 2002 - [facility] paged  2006 - Dr. Glory Buff on stroke cart  2016 - NCCT results relayed to Dr. Glory Buff 2017 - Patient back to CT for advanced imaging

## 2023-01-05 NOTE — ED Notes (Signed)
Back from CT with pt.

## 2023-01-05 NOTE — Progress Notes (Signed)
PHARMACIST - PHYSICIAN COMMUNICATION  CONCERNING:  Enoxaparin (Lovenox) for DVT Prophylaxis    RECOMMENDATION: Patient was prescribed enoxaprin 40mg  q24 hours for VTE prophylaxis.   Filed Weights   01/05/23 1959 01/05/23 2005  Weight: 98.3 kg (216 lb 11.4 oz) 98.2 kg (216 lb 7.9 oz)    Body mass index is 31.06 kg/m.  Estimated Creatinine Clearance: 79.3 mL/min (by C-G formula based on SCr of 0.99 mg/dL).   Based on [facility] policy patient is candidate for enoxaparin 0.5mg /kg TBW SQ every 24 hours based on BMI being >30.  DESCRIPTION: Pharmacy has adjusted enoxaparin dose per [facility] policy.  Patient is now receiving enoxaparin 0.5 mg/kg every 24 hours   Otelia Sergeant, PharmD, [facility] 01/05/2023 10:51 PM

## 2023-01-05 NOTE — ED Triage Notes (Addendum)
Pt arrived via [facility] from home with CODE STROKE activation in field. Per pts spouse, pt was last seen at his baseline @ 1830 and then at 1900, pt began to c/o sudden onset HA. At time, spouse noticed pt to have slurred speech and right sided facial droop. Pt arrived A&O x4 with slurred speech, right sided facial droop and right side upper extremity drift. EDP cleared and pt taken straight to CT by EMS stretcher. Tele-Neuro activated with report details.     EMS VS: 176/100, 84HR, 97% RA  CBG: 150  ** Hx/o CVA  ** No reported blood thinners

## 2023-01-05 NOTE — H&P (Signed)
[facility] History and Physical  Mark Durham QIO:[id number] DOB: 1950-08-23 DOA: 01/05/2023 PCP: [facility]  Admitted from: Home Chief Complaint: Strokelike symptoms  History of Present Illness: Mark Durham is a 72 y.o. 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  He has had residual physical and mild cognitive deficits since then.  He has right-sided weakness for which she follows up with outpatient PT.  He had right-sided visual loss as well with the stroke. He lives with his wife.  According to his wife, patient has physical improved and is able to ambulate inside the house without any assistive device. Around 7 PM this evening, patient complained of sudden onset headache.  Wife also noticed slurred speech and more pronounced right-sided facial droop.  When he stood up to walk, she noticed that he was weaker and limping on the right side.  She gave him aspirin.  Called EMS and brought to the ED.  In the ED, patient was hemodynamically stable CT head did not show any acute intracranial abnormality.  It showed chronic small vessel ischemic disease, diffuse ventricular prominence somewhat out of proportion to cortical sulcation, findings suspicious for NPH. CT angio of head did not show any large vessel occlusion or other emergent finding.  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  4 mm outpouching extending from the proximal cervical right ICA likely a small ulcerated plaque, similar to prior scan from 2022.  Seen by teleneurology. Neurology exam noted mild left facial weakness, mild dysarthria and possible left hemibody hypoesthesia.  Because of persistent symptoms, patient was offered tPA which he refused. Aspirin had already been given prior to presentation.  Plavix loading  dose was given Hospitalist service consulted for TIA workup.  At the time of my evaluation, patient was lying down in bed.  Not in distress.  Oriented to time, place.  Noted to the month of June.  Not restless or agitated.  Left facial deviation and right-sided weakness is better than at presentation per his wife.  Review of Systems:  All systems were reviewed and were negative unless otherwise mentioned in the HPI   Past medical history: Past Medical History:  Diagnosis Date   Hypertension     Past surgical history: Past Surgical History:  Procedure Laterality Date   TOTAL HIP ARTHROPLASTY Right     Social History:  reports that he has been smoking. He has been exposed to tobacco smoke. He has never used smokeless tobacco. He reports that he does not drink alcohol and does not use drugs.  Allergies:  No Known Allergies Patient has no known allergies.   Family history:  History reviewed. No pertinent family history.   Home Meds: Prior to Admission medications   Medication Sig Start Date End Date Taking? Authorizing Provider  acetaminophen (TYLENOL) 500 MG tablet Take 1,000 mg by mouth every 6 (six) hours as needed for moderate pain (as needed for pain). Take 2 tablets 3x per day as needed for pain    [provider]  amLODipine (NORVASC) 10 MG tablet Take 10 mg by mouth daily.    [provider]  amLODipine (NORVASC) 5 MG tablet Take 1 tablet (5 mg total) by mouth daily. Patient not taking: Reported on 11/08/2022 11/17/21 11/17/22  Gilles Chiquito, MD  aspirin EC 81 MG tablet Take 81 mg by  mouth daily. Swallow whole.    [provider]  atorvastatin (LIPITOR) 80 MG tablet Take 80 mg by mouth daily. Take 1 tablet by mouth daily    [provider]  diclofenac Sodium (VOLTAREN) 1 % GEL Apply 2 g topically in the morning, at noon, and at bedtime. Apply 2g topically 3x daily as needed for arthritis left hip    [provider]  Lacosamide  100 MG TABS Take 100 mg by mouth 2 (two) times daily. Take 1 tablet (100mg  total) by mouth 2 times a day Patient not taking: Reported on 11/15/2022    [provider]  levETIRAcetam (KEPPRA) 1000 MG tablet Take 2,000 mg by mouth 2 (two) times daily. Take 2 tablets by mouth 2x per day.    [provider]  melatonin 3 MG TABS tablet Take 3 mg by mouth at bedtime.    [provider]  omeprazole (PRILOSEC OTC) 20 MG tablet Take 20 mg by mouth daily. Take 1 tablet by mouth daily as needed.    [provider]  polyethylene glycol (MIRALAX / GLYCOLAX) 17 g packet Take 17 g by mouth daily. Drink by mouth daily as needed.    [provider]  senna (SENOKOT) 8.6 MG TABS tablet Take 2 tablets by mouth at bedtime. Take 2 tablets by mouth nightly.    [provider]  tadalafil (CIALIS) 20 MG tablet Take 1 tablet (20 mg total) by mouth daily as needed for erectile dysfunction (1 hour prior to sexual activity). Patient not taking: Reported on 11/15/2022 11/18/21   Sondra Come, MD    Physical Exam: Vitals:   01/05/23 2005 01/05/23 2045 01/05/23 2100 01/05/23 2130  BP:  (!) 151/54 127/60 138/72  Pulse:  86 86 85  Resp:  19 (!) 21 19  Temp:      TempSrc:      SpO2:  95% 97% 96%  Weight: 98.2 kg     Height:       Wt Readings from Last 3 Encounters:  01/05/23 98.2 kg  11/18/21 95.3 kg  04/15/21 93 kg   Body mass index is 31.06 kg/m.  General exam: Pleasant, elderly African-American male.  Not in distress Skin: No rashes, lesions or ulcers. HEENT: Atraumatic, normocephalic, no obvious bleeding Lungs: Clear to auscultation bilaterally CVS: Regular rate and rhythm, no murmur GI/Abd soft, nontender, nondistended, bowel sound present CNS: Alert, awake, oriented to place and person, oriented to time with difficult.  Mild left facial deviation noted.  Mild weakness on right proximal upper extremity muscles noted.  Close to baseline per wife Psychiatry:  Mood appropriate Extremities: No pedal edema, no calf tenderness   ------------------------------------------------------------------------------------------------------ Assessment/Plan: Principal Problem:   TIA (transient ischemic attack)  Acute CVA Presented with slurred speech, right facial droop, right sided weakness. Symptoms were acute worsening of his previous symptoms.  Unclear if he had new TIA or transient resurfacing of symptoms from previous stroke Seen by neurology CT head without acute intracranial abnormality.  CT angio head without large vessel occlusion However, patient has chronic small vessel ischemic disease as well as multifocal intracranial atherosclerotic disease TIA workup ordered -MRI brain, echo with bubble, A1c, lipid panel, PT OT eval, neurology consult Swallow screen to be done in the ED. Aspirin given prior to presentation.  Loaded with Plavix in the ED.  Multifocal intracranial artery stenosis CT angio head did not show any large vessel occlusion but showed diffuse intracranial atherosclerotic disease and chronic ischemic changes. Medical  management planned.  Essential hypertension PTA meds-amlodipine.  Keep it on hold for permissive hypertension for first 48 to 72 hours  HLD Lipitor to continue  Poststroke epilepsy Recently seen by his neurologist in May 2024, tapered off Keppra and continued on Vimpat at 200 mg twice daily.  Visual impairment Has right-sided visual loss since the stroke.  Sees ophthalmology as an outpatient.  Left hypopharyngeal growth CT head also showed polypoid density at the posterior left hypopharynx at the level of the epiglottis, indeterminate. While this finding could reflect a small amount of retained secretions or retention cyst, a possible small soft tissue lesion is difficult to exclude. Correlation with direct visualization recommended by radiology.  Needs referral for ENT evaluation as an outpatient  Mobility: PT  eval  Goals of care   Code Status: Full Code    DVT prophylaxis: Lovenox     Antimicrobials: None Fluid: None Consultants: Neurology Family Communication: None at bedside  Dispo: The patient is from: Home              Anticipated d/c is to: Pending clinical course  Diet: Diet Order             Diet NPO time specified  Diet effective now                    ------------------------------------------------------------------------------------- Severity of Illness: The appropriate patient status for this patient is OBSERVATION. Observation status is judged to be reasonable and necessary in order to provide the required intensity of service to ensure the patient's safety. The patient's presenting symptoms, physical exam findings, and initial radiographic and laboratory data in the context of their medical condition is felt to place them at decreased risk for further clinical deterioration. Furthermore, it is anticipated that the patient will be medically stable for discharge from the hospital within 2 midnights of admission.  -------------------------------------------------------------------------------------  Labs on Admission:   CBC: Recent Labs  Lab 01/05/23 2005  WBC 7.9  NEUTROABS 4.2  HGB 14.6  HCT 46.6  MCV 81.8  PLT 153    Basic Metabolic Panel: Recent Labs  Lab 01/05/23 2005  NA 137  K 3.3*  CL 104  CO2 23  GLUCOSE 125*  BUN 16  CREATININE 0.99  CALCIUM 9.4    Liver Function Tests: Recent Labs  Lab 01/05/23 2005  AST 17  ALT 18  ALKPHOS 129*  BILITOT 0.9  PROT 8.8*  ALBUMIN 4.4   No results for input(s): "LIPASE", "AMYLASE" in the last 168 hours. No results for input(s): "AMMONIA" in the last 168 hours.  Cardiac Enzymes: No results for input(s): "CKTOTAL", "CKMB", "CKMBINDEX", "TROPONINI" in the last 168 hours.  BNP (last 3 results) No results for input(s): "BNP" in the last 8760 hours.  ProBNP (last 3 results) No results for  input(s): "PROBNP" in the last 8760 hours.  CBG: No results for input(s): "GLUCAP" in the last 168 hours.  Lipase  No results found for: "LIPASE"   Urinalysis    Component Value Date/Time   APPEARANCEUR Clear 04/15/2021 0908   GLUCOSEU Negative 04/15/2021 0908   BILIRUBINUR Negative 04/15/2021 0908   PROTEINUR Negative 04/15/2021 0908   NITRITE Negative 04/15/2021 0908   LEUKOCYTESUR Negative 04/15/2021 0908     Drugs of Abuse  No results found for: "LABOPIA", "COCAINSCRNUR", "LABBENZ", "AMPHETMU", "THCU", "LABBARB"    Radiological Exams on Admission: CT ANGIO HEAD NECK W WO CM  Result Date: 01/05/2023 CLINICAL DATA:  Initial evaluation for neuro deficit,  slurred speech. EXAM: CT ANGIOGRAPHY HEAD AND NECK WITH AND WITHOUT CONTRAST TECHNIQUE: Multidetector CT imaging of the head and neck was performed using the standard protocol during bolus administration of intravenous contrast. Multiplanar CT image reconstructions and MIPs were obtained to evaluate the vascular anatomy. Carotid stenosis measurements (when applicable) are obtained utilizing NASCET criteria, using the distal internal carotid diameter as the denominator. RADIATION DOSE REDUCTION: This exam was performed according to the departmental dose-optimization program which includes automated exposure control, adjustment of the mA and/or kV according to patient size and/or use of iterative reconstruction technique. CONTRAST:  75mL OMNIPAQUE IOHEXOL 350 MG/ML SOLN COMPARISON:  CT from earlier the same day as well as prior exam from 07/31/2020. FINDINGS: CTA NECK FINDINGS Aortic arch: Partially visualized aortic arch normal. Bovine branching pattern noted. Mild atheromatous change about the arch itself. No stenosis about the origin the great vessels. Right carotid system: Right common and internal carotid arteries are patent without dissection. Mild atheromatous irregularity about the right carotid bulb without significant stenosis. 4 mm  outpouching extending from the proximal cervical right ICA, likely a small ulcerative plaque, similar to prior (series 5, image 406), similar prior. Left carotid system: Left common and internal carotid arteries are patent without dissection. Mild atheromatous change about the left carotid bulb without hemodynamically significant stenosis. Vertebral arteries: Both vertebral arteries arise from subclavian arteries. No proximal subclavian artery stenosis. Right vertebral artery dominant. Vertebral arteries are patent without significant stenosis or dissection. Skeleton: No discrete or worrisome osseous lesions. Moderate multilevel cervical spondylosis. Other neck: Mildly prominent left level 3 lymph nodes measure up to 1.6 cm (series 4, image 67), indeterminate. There is an apparent polypoid density at the posterior left hypopharynx at the level of the epiglottis (series 4, image 70). While this finding could reflect a small amount of retained secretions or retention cyst, a possible small soft tissue lesion is difficult to exclude. This was present on prior. Upper chest: Emphysema. Partially calcified 8 mm nodule at the left upper lobe is stable as compared to 2022, consistent with a benign finding. Review of the MIP images confirms the above findings CTA HEAD FINDINGS Anterior circulation: Atheromatous change about the carotid siphons bilaterally. Associated mild to moderate narrowing at the para clinoid right ICA. There is a focal severe stenosis at the contralateral left carotid siphon (series 7, image 118). A1 segments patent bilaterally. Left A1 hypoplastic. Normal anterior communicating complex. Anterior cerebral arteries patent without stenosis. M1 segments mildly irregular but patent without significant stenosis or occlusion. Severe proximal right M2 stenosis noted (series 9, image 22). Overall, the right MCA branches are patent but attenuated as compared to the left, similar to prior. Posterior circulation:  Dominant right V4 segment patent without stenosis. Left V4 segment mildly attenuated. Severe diffuse narrowing of the distal left V4 segment just prior to the vertebrobasilar junction (series 5, image 254). Basilar patent without stenosis. Superior cerebral arteries patent bilaterally. Both PCAs primarily supplied via the basilar. Atheromatous irregularity about the PCAs which remain patent to their distal aspects. Venous sinuses: Not well assessed due to arterial timing the contrast bolus. Anatomic variants: As above.  No aneurysm. Review of the MIP images confirms the above findings IMPRESSION: 1. Negative CTA for large vessel occlusion or other emergent finding. 2. Intracranial atherosclerotic disease, with most notable findings including severe proximal right M2 and distal left V4 stenoses, with additional severe stenosis at the cavernous left ICA. Right MCA branches are overall attenuated as compared to the left  but remain patent. 3. 4 mm outpouching extending from the proximal cervical right ICA, likely a small ulcerative plaque, similar to prior. 4. Polypoid density at the posterior left hypopharynx at the level of the epiglottis, indeterminate. While this finding could reflect a small amount of retained secretions or retention cyst, a possible small soft tissue lesion is difficult to exclude. Correlation with direct visualization recommended. 5. Mildly prominent left level 3 lymph nodes measure up to 1.6 cm, indeterminate. Aortic Atherosclerosis (ICD10-I70.0) and Emphysema (ICD10-J43.9). Electronically Signed   By: Rise Mu M.D.   On: 01/05/2023 22:23   CT HEAD CODE STROKE WO CONTRAST  Result Date: 01/05/2023 CLINICAL DATA:  Code stroke. Initial evaluation for neuro deficit, stroke. Left-sided deficits. EXAM: CT HEAD WITHOUT CONTRAST TECHNIQUE: Contiguous axial images were obtained from the base of the skull through the vertex without intravenous contrast. RADIATION DOSE REDUCTION: This exam  was performed according to the departmental dose-optimization program which includes automated exposure control, adjustment of the mA and/or kV according to patient size and/or use of iterative reconstruction technique. COMPARISON:  Prior study from 07/31/2020. FINDINGS: Brain: Age-related cerebral atrophy with chronic small vessel ischemic disease. No acute intracranial hemorrhage. No acute large vessel territory infarct. No mass lesion, midline shift or mass effect. Diffuse ventricular prominence, somewhat out of proportion to cortical sulcation. While this finding could be related atrophy, a degree of NPH could be contributory. No extra-axial fluid collection. Vascular: No convincing abnormal hyperdense vessel. Scattered vascular calcifications noted within the carotid siphons. Skull: Scalp soft tissues and calvarium demonstrate no acute finding. Sinuses/Orbits: Prior ocular lens replacement on the right. Globes orbital soft tissues demonstrate no acute finding. Paranasal sinuses are largely clear. No mastoid effusion. Other: None. ASPECTS [facility] Stroke Program Early CT Score) - Ganglionic level infarction (caudate, lentiform nuclei, internal capsule, insula, M1-M3 cortex): 7 - Supraganglionic infarction (M4-M6 cortex): 3 Total score (0-10 with 10 being normal): 10 IMPRESSION: 1. No acute intracranial abnormality. 2. ASPECTS is 10. 3. Age-related cerebral atrophy with chronic small vessel ischemic disease. 4. Diffuse ventricular prominence somewhat out of proportion to cortical sulcation. While this finding could be related underlying atrophy, a degree of NPH could be contributory, and could be considered in the correct clinical setting. Critical Value/emergent results were called by telephone at the time of interpretation on 01/05/2023 at 8:11 pm to provider [facility] , who verbally acknowledged these results. Electronically Signed   By: Rise Mu M.D.   On: 01/05/2023 20:16     Signed, Lorin Glass, MD [facility] 01/06/2023

## 2023-01-05 NOTE — Consult Note (Addendum)
[facility] TeleNeurology Consult Services   Patient Name:   Mark Durham, Mark Durham Date of Birth:   [birth year] Identification Number:   MRN - [id number] Date of Service:   01/05/2023 20:02:31  Diagnosis:       I63.89 - Cerebrovascular accident (CVA) due to other mechanism [facility])  Impression:      72 yo M with history of stroke (with residual right-sided deficits) who presented for evaluation of acute onset of left facial droop and slurred speech. Other than chronic difficulty counting fingers in the right visual fields, his neurologic exam was notable for mild left facial weakness, mild dysarthria, and possible left hemibody hypoesthesia (though not consistent). His presentation is concerning for possible acute stroke. After discussion regarding the risks/benefits and alternatives of thrombolytics, patient ultimately declined. CTA head/neck currently pending to rule out LVO, though suspicion for this is fairly low (will place addendum once results are reviewed). Recommend admission for further stroke workup as discussed below.  Addendum: CTA head/neck reviewed without evidence of proximal intracranial LVO per my review, though noted to have multifocal areas of large artery atherosclerotic disease (radiology report pending, will be contacted by stroke RN regarding results).  Our recommendations are outlined below.  Recommendations:        Stroke/Telemetry Floor       Neuro Checks       Bedside Swallow Eval       DVT Prophylaxis       IV Fluids, Normal Saline       Head of Bed 30 Degrees       Euglycemia and Avoid Hyperthermia (PRN Acetaminophen)       Antihypertensives PRN if Blood pressure is greater than 220/120 or there is a concern for End organ damage/contraindications for permissive HTN. If blood pressure is greater than 220/120 give labetalol PO or IV or Vasotec IV with a goal of 15% reduction in BP during the first 24 hours.       Continue home ASA for secondary  stroke prevention       MRI brain without contrast to evaluate for acute stroke       TTE to assess for intracardiac abnormalities       Telemetry to monitor for occult arrhythmias       Lipid panel and hemoglobin A1c to assess for modifiable stroke risk factors       Of note, family was requesting transfer to [facility] as they treated him previously for stroke earlier this year  Sign Out:       Discussed with Emergency Department Provider    ------------------------------------------------------------------------------  Advanced Imaging: Advanced imaging has been ordered. Results pending.   Metrics: Last Known Well: 01/05/2023 18:30:00 [facility] Notification Time: 01/05/2023 20:02:31 Arrival Time: 01/05/2023 19:52:57 Stamp Time: 01/05/2023 20:02:31 Initial Response Time: 01/05/2023 20:05:14 Symptoms: left facial droop and slurred speech. Initial patient interaction: 01/05/2023 20:07:44 NIHSS Assessment Completed: 01/05/2023 20:16:59 Patient is not a candidate for Thrombolytic. Thrombolytic Medical Decision: 01/05/2023 20:17:00 Patient was not deemed candidate for Thrombolytic because of following reasons: Patient and/or Family refused.  I personally Reviewed the CT Head and it Showed no acute process  Primary Provider Notified of Diagnostic Impression and Management Plan on: 01/05/2023 20:30:15    ------------------------------------------------------------------------------  History of Present Illness: Patient is a 72 year old Male.  Patient was brought by EMS for symptoms of left facial droop and slurred speech. Patient was last known normal around 6:30 PM this evening. Later around 7 PM, he was noted to have  acute onset of left facial weakness and slurred speech. Spouse at bedside also noted that he was having difficulty standing from a seated position due to weakness, though is uncertain if he had any unilateral extremity weakness. He recently had a stroke in 07/2022  with residual right-sided vision changes but has never experienced left-sided deficits.    Past Medical History:      Hypertension      Stroke  Medications:  No Anticoagulant use  Antiplatelet use: Yes ASA Reviewed EMR for current medications  Allergies:  Reviewed,NKDA  Social History: Smoking: Former  Family History:  There is no family history of premature cerebrovascular disease pertinent to this consultation  ROS : 14 Points Review of Systems was performed and was negative except mentioned in HPI.  Past Surgical History: There Is No Surgical History Contributory To Today's Visit     Examination: BP(145/93), Pulse(86), 1A: Level of Consciousness - Alert; keenly responsive + 0 1B: Ask Month and Age - 1 Question Right + 1 1C: Blink Eyes & Squeeze Hands - Performs Both Tasks + 0 2: Test Horizontal Extraocular Movements - Normal + 0 3: Test Visual Fields - Partial Hemianopia + 1 4: Test Facial Palsy (Use Grimace if Obtunded) - Minor paralysis (flat nasolabial fold, smile asymmetry) + 1 5A: Test Left Arm Motor Drift - No Drift for 10 Seconds + 0 5B: Test Right Arm Motor Drift - No Drift for 10 Seconds + 0 6A: Test Left Leg Motor Drift - No Drift for 5 Seconds + 0 6B: Test Right Leg Motor Drift - No Drift for 5 Seconds + 0 7: Test Limb Ataxia (FNF/Heel-Shin) - No Ataxia + 0 8: Test Sensation - Mild-Moderate Loss: Less Sharp/More Dull + 1 9: Test Language/Aphasia - Normal; No aphasia + 0 10: Test Dysarthria - Mild-Moderate Dysarthria: Slurring but can be understood + 1 11: Test Extinction/Inattention - No abnormality + 0  NIHSS Score: 5  NIHSS Free Text : Some trouble counting fingers on the right visual fields (chronic per report).  Pre-Morbid Modified Rankin Scale: 3 Points = Moderate disability; requiring some help, but able to walk without assistance  Spoke with : Dr. Vicente Males  This consult was conducted in real time using interactive audio and Theatre manager. Patient was informed of the technology being used for this visit and agreed to proceed. Patient located in hospital and provider located at home/office setting.   Patient is being evaluated for possible acute neurologic impairment and high probability of imminent or life-threatening deterioration. I spent total of 30 minutes providing care to this patient, including time for face to face visit via telemedicine, review of medical records, imaging studies and discussion of findings with providers, the patient and/or family.   Dr Wyline Mood   [facility] For Inpatient follow-up with [facility] physician please call [facility] [phone]. This is not an outpatient service. Post hospital discharge, please contact hospital directly.  Please do not communicate with [facility] physicians via secure chat. If you have any questions, Please contact [facility]. Please call or reconsult our service if there are any clinical or diagnostic changes.

## 2023-01-05 NOTE — ED Notes (Signed)
Pt relative called out because pt had to use the restroom, by the time this tech when into the room pt had already voided on himself. This tech and pt relative changed bed sheets and placed brief and gown on pt. Pt was given a warm blanket. Pt has no other needs at the time.

## 2023-01-06 ENCOUNTER — Observation Stay: Payer: BC Managed Care – PPO

## 2023-01-06 ENCOUNTER — Observation Stay (HOSPITAL_BASED_OUTPATIENT_CLINIC_OR_DEPARTMENT_OTHER)
Admit: 2023-01-06 | Discharge: 2023-01-06 | Disposition: A | Discharge status: Home / Self Care | Payer: BC Managed Care – PPO | Attending: Internal Medicine | Admitting: Internal Medicine

## 2023-01-06 ENCOUNTER — Other Ambulatory Visit: Payer: Self-pay

## 2023-01-06 ENCOUNTER — Ambulatory Visit: Payer: BC Managed Care – PPO | Admitting: Physical Therapy

## 2023-01-06 ENCOUNTER — Encounter: Payer: BC Managed Care – PPO | Admitting: Speech Pathology

## 2023-01-06 DIAGNOSIS — N390 Urinary tract infection, site not specified: Secondary | ICD-10-CM | POA: Diagnosis not present

## 2023-01-06 DIAGNOSIS — G459 Transient cerebral ischemic attack, unspecified: Secondary | ICD-10-CM

## 2023-01-06 DIAGNOSIS — A4151 Sepsis due to Escherichia coli [E. coli]: Secondary | ICD-10-CM | POA: Diagnosis not present

## 2023-01-06 LAB — CBC
HCT: 43.6 % (ref 39.0–52.0)
Hemoglobin: 13.8 g/dL (ref 13.0–17.0)
MCH: 25.7 pg — ABNORMAL LOW (ref 26.0–34.0)
MCHC: 31.7 g/dL (ref 30.0–36.0)
MCV: 81.3 fL (ref 80.0–100.0)
Platelets: 244 10*3/uL (ref 150–400)
RBC: 5.36 MIL/uL (ref 4.22–5.81)
RDW: 14.6 % (ref 11.5–15.5)
WBC: 7.6 10*3/uL (ref 4.0–10.5)
nRBC: 0 % (ref 0.0–0.2)

## 2023-01-06 LAB — BASIC METABOLIC PANEL
Anion gap: 7 (ref 5–15)
BUN: 13 mg/dL (ref 8–23)
CO2: 24 mmol/L (ref 22–32)
Calcium: 9.3 mg/dL (ref 8.9–10.3)
Chloride: 106 mmol/L (ref 98–111)
Creatinine, Ser: 0.78 mg/dL (ref 0.61–1.24)
GFR, Estimated: >60 mL/min (ref >60)
Glucose, Bld: 111 mg/dL — ABNORMAL HIGH (ref 70–99)
Potassium: 3.3 mmol/L — ABNORMAL LOW (ref 3.5–5.1)
Sodium: 137 mmol/L (ref 135–145)

## 2023-01-06 LAB — ECHOCARDIOGRAM COMPLETE BUBBLE STUDY
AR max vel: 2.28 cm2
AV Area VTI: 2.72 cm2
AV Area mean vel: 2.11 cm2
AV Mean grad: 3.5 mmHg
AV Peak grad: 5.9 mmHg
Ao pk vel: 1.21 m/s
Area-P 1/2: 2.87 cm2
MV VTI: 2.25 cm2
S' Lateral: 2.3 cm

## 2023-01-06 LAB — URINALYSIS, ROUTINE W REFLEX MICROSCOPIC
Bilirubin Urine: NEGATIVE
Glucose, UA: NEGATIVE mg/dL
Hgb urine dipstick: NEGATIVE
Ketones, ur: NEGATIVE mg/dL
Leukocytes,Ua: NEGATIVE
Nitrite: NEGATIVE
Protein, ur: NEGATIVE mg/dL
Specific Gravity, Urine: 1.044 — ABNORMAL HIGH (ref 1.005–1.030)
pH: 6 (ref 5.0–8.0)

## 2023-01-06 LAB — LIPID PANEL
Cholesterol: 80 mg/dL (ref 0–200)
HDL: 27 mg/dL — ABNORMAL LOW (ref >40)
LDL Cholesterol: 40 mg/dL (ref 0–99)
Total CHOL/HDL Ratio: 3 RATIO
Triglycerides: 64 mg/dL (ref <150)
VLDL: 13 mg/dL (ref 0–40)

## 2023-01-06 LAB — CBG MONITORING, ED: Glucose-Capillary: 134 mg/dL — ABNORMAL HIGH (ref 70–99)

## 2023-01-06 LAB — TSH: TSH: 1.182 u[IU]/mL (ref 0.350–4.500)

## 2023-01-06 MED ORDER — IOHEXOL 350 MG/ML SOLN
75.0000 mL | Freq: Once | INTRAVENOUS | Status: AC | PRN
  Administered 2023-01-06: 75 mL via INTRAVENOUS

## 2023-01-06 MED ORDER — AZITHROMYCIN 500 MG PO TABS: 500.0000 mg | ORAL_TABLET | Freq: Every day | ORAL | 0 refills | Status: DC

## 2023-01-06 NOTE — Progress Notes (Signed)
SLP Cancellation Note  Patient Details Name: Mark Durham MRN: [id number] DOB: [birth year]   Cancelled treatment:       Reason Eval/Treat Not Completed: Patient at procedure or test/unavailable  Pt currently out of room. Spoke with pt's nurse. Pt failed [employer] d/t inability to seal lips. This however is chronic as pt is known to this Clinical research associate from Outpatient ST services. Recommend nurse administer medications (if needed) with thin liquids.   Will attempt to see pt at next available opportunity.   Mark Durham B. Dreama Saa, M.S., CCC-SLP, CBIS Speech-Language Pathologist Certified Brain Injury Specialist [facility] [phone] Ascom [phone] Fax [phone]    Mark Durham Dreama Saa 01/06/2023, 9:07 AM

## 2023-01-06 NOTE — Hospital Course (Addendum)
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  He has had residual physical and mild cognitive deficits since then.  He has right-sided weakness for which he follows up with outpatient PT.  He had right-sided visual loss as well with the stroke. He lives with his wife.  According to his wife, patient physically improved and is able to ambulate inside the house without any assistive device. Around 7 PM 01/05/23-patient complained of sudden onset headache.Wife also noticed slurred speech and more pronounced right-sided facial droop.When he stood up to walk, she noticed that he was weaker and limping on the right side.  She gave him aspirin.  Called EMS and brought to the ED.   In the ED, vitals are stable.  Labs showed mild hypokalemia otherwise unremarkable  CT head : no acute intracranial abnormality.showed chronic small vessel ischemic disease, diffuse ventricular prominence somewhat out of proportion to cortical sulcation, findings suspicious for NPH. CT angio of head did not show any LVO or other emergent finding.  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  4 mm outpouching extending from the proximal cervical right ICA likely a small ulcerated plaque, similar to prior scan from 2022. Seen by teleneurology>who noted mild left facial weakness, mild dysarthria and possible left hemibody hypoesthesia.Because of persistent symptoms, patient was offered tPA which he refused. Aspirin had already been given prior to presentation.  Plavix loading dose was given Patient admitted for TIA workup MRI brain obtained: No acute finding, showed "Asymmetric T2/FLAIR signal intensity involving the right transverse and sigmoid sinuses as well as the right jugular bulb.While this finding is favored to reflect slow/sluggish flow, possible dural  sinus thrombosis is difficult to exclude on this exam. Correlation with dedicated MRV, with and without contrast, suggested for further evaluation as clinically warranted" other findings with is related cerebral atrophy advanced chronic microvascular ischemic disease small remote left parietal infarct, 1 cm Demeter simple cyst in the posterior. Patient was admitted for further management> left facial deviation and right-sided weakness was better during admission. Seen by neurology MRI brain no acute finding question of dural sinus thrombosis due to sluggish blood flow but CT venogram was unremarkable. Patient can be discharged after completing stroke workup as per neurology

## 2023-01-06 NOTE — Therapy (Deleted)
OUTPATIENT PHYSICAL THERAPY NEURO TREATMENT     Patient Name: Mark Durham MRN: [id number] DOB:Jan 27, 1951, 72 y.o., male Today's Date: 01/06/2023   PCP: [facility] REFERRING PROVIDER: Hope Pigeon MD   END OF SESSION:     Past Medical History:  Diagnosis Date   Hypertension    Past Surgical History:  Procedure Laterality Date   TOTAL HIP ARTHROPLASTY Right    Patient Active Problem List   Diagnosis Date Noted   TIA (transient ischemic attack) 01/05/2023   GERD (gastroesophageal reflux disease) 02/26/2021   S/P hip replacement, right 03/04/2020   Primary osteoarthritis of right hip 01/24/2020   Chronic pain of right knee 11/23/2019   Chronic right hip pain 11/23/2019   Osteoarthritis of knee 11/13/2018   History of adenomatous polyp of colon 02/14/2018   History of Barrett's esophagus 02/14/2018    ONSET DATE: January 2024  REFERRING DIAG: weakness due to stroke   THERAPY DIAG:  Muscle weakness (generalized)  Other lack of coordination  Unsteadiness on feet  Difficulty in walking, not elsewhere classified  Rationale for Evaluation and Treatment: Rehabilitation  SUBJECTIVE:                                                                                                                                                                                             SUBJECTIVE STATEMENT: ***  Pt reports doing well today, notes no falls/stumbles, no significant changes since last session.  Pt accompanied by: significant other  PERTINENT HISTORY: Patient presents to physical therapy for weakness s/p stroke . 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 Per documentation he had a secondary stroke during the hospital stay as well as three seizures. Patient was admitted to [facility] inpatient rehabilitation from 08/10/22-08/31/22 where he was discharged home. Patient was discharged from Home health on 10/28/22  (PT, OT, and ST). PMH includes GERD, OA, R knee/hip pain, Barrett's esophagus. Is using a walker, still drags right foot. Needs assistance for transfers but can bath and dress self.   PAIN:  Are you having pain? No  PRECAUTIONS: Fall  WEIGHT BEARING RESTRICTIONS: No  FALLS: Has patient fallen in last 6 months? No    PATIENT GOALS: walking with less AD, get back to mowing the yard, gardening, grilling     OBJECTIVE:   TODAY'S TREATMENT: DATE: 01/06/23 Unless otherwise stated, CGA was provided and gait belt donned in order to ensure pt safety *** NMR: - Amb 150 ft x3 with rolling walker, preformed at different times throughout session. 1st lap, Cues for R foot clearance given multiple times  throughout. 2nd lap pt beginning to fatigue  towards end of lap. Final lap performed towards end of session, focused on motor-cognitive dual task and visual scanning, having pt recite letters and pictures in hallway, only able to recite ~60% of items.  -Side stepping in // bars down and back x2 with 5# AW, cues given for large steps and foot clearance, pt fatiguing quickly when side stepping to L side, slow to pick up trailing R foot.  TE: -2 x 10 STS, sitting on airex pad, hands on knees, cues for complete stand with upright posture.  -LAQ x 10 ea side with 5# AW  -Seated marching x10 ea side, multiple cues to clear foot completely off ground, able to only 2-3 reps after cue given.   PATIENT EDUCATION: Education details: goals, POC, HEP Pt educated throughout session about proper posture and technique with exercises. Improved exercise technique, movement at target joints, use of target muscles after min to mod verbal, visual, tactile cues.  Person educated: Patient and Spouse Education method: Explanation, Demonstration, Tactile cues, and Verbal cues Education comprehension: verbalized understanding, returned demonstration, verbal cues required, and tactile cues required  HOME EXERCISE  PROGRAM:  Access Code: [email or web address] URL: [email or web address] Date: 12/23/2022 Prepared by: Grier Rocher  Exercises - Standing Tandem Balance with Counter Support  - 1 x daily - 5 x weekly - 3 sets - 4 reps - 10 hold - Standing Single Leg Stance with Counter Support  - 1 x daily - 5 x weekly - 3 sets - 4 reps - 10 hold  Access Code: [email or web address] URL: [email or web address] Date: 11/03/2022 Prepared by: Precious Bard  Exercises - Seated March  - 1 x daily - 7 x weekly - 2 sets - 10 reps - 5 hold - Seated Long Arc Quad  - 1 x daily - 7 x weekly - 2 sets - 10 reps - 5 hold - Seated Heel Toe Raises  - 1 x daily - 7 x weekly - 2 sets - 10 reps - 5 hold - Seated Hip Abduction  - 1 x daily - 7 x weekly - 2 sets - 10 reps - 5 hold - Seated Isometric Hip Adduction with Ball  - 1 x daily - 7 x weekly - 2 sets - 10 reps - 5 hold  GOALS: Goals reviewed with patient? Yes  SHORT TERM GOALS: Target date: 01/17/2023  Patient will be independent in home exercise program to improve strength/mobility for better functional independence with ADLs. Baseline:4/24: HEP given  Goal status: IN PROGRESS    LONG TERM GOALS: Target date: 01/26/2023  Patient will increase FOTO score to equal to or greater than  51 to demonstrate statistically significant improvement in mobility and quality of life.   Baseline: 40 6/3: 41 Goal status: IN PROGRESS  2.  Patient (> 23 years old) will complete five times sit to stand test in < 15 seconds indicating an increased LE strength and improved balance. Baseline: 4/24: 38.37 with BUE support 6/3: 12.02 sec with UE support Goal status: IN PROGRESS  3.  Patient will increase 10 meter walk test to >1.80m/s as to improve gait speed for better community ambulation and to reduce fall risk. Baseline: 4/24: 19.2 seconds with RW; dragging of R foot  6/3: 17.175 with no AD and CGA from PT.   Goal status: IN PROGRESS  4.  Patient will increase Berg  Balance score by > 6 points to demonstrate decreased fall risk during functional activities. Baseline: 4/24: see  next session 5/2: 30/56 6/3: 31/56 Goal status: IN PROGRESS    ASSESSMENT:  CLINICAL IMPRESSION:  *** Patient appeared ready for treatment on this day. Today's session focused on continuation of general LE strengthening. Pt continuing to present with quiet/flat affect. Pt gait presenting bradykinetic, as are transitions. Pt was agreeable to all exercises today though fatiguing quickly with each. SPT consistently checking in with pt throughout session to ensure pt is appropriately challenged. Pt will continue to benefit from skilled therapy to address remaining deficits in order to improve overall QoL and return to PLOF.      OBJECTIVE IMPAIRMENTS: Abnormal gait, cardiopulmonary status limiting activity, decreased activity tolerance, decreased balance, decreased cognition, decreased coordination, decreased endurance, decreased knowledge of condition, decreased knowledge of use of DME, decreased mobility, difficulty walking, decreased strength, decreased safety awareness, impaired perceived functional ability, impaired flexibility, impaired UE functional use, impaired vision/preception, improper body mechanics, and postural dysfunction.   ACTIVITY LIMITATIONS: carrying, lifting, bending, sitting, standing, squatting, sleeping, stairs, transfers, bed mobility, bathing, toileting, reach over head, hygiene/grooming, locomotion level, and caring for others  PARTICIPATION LIMITATIONS: meal prep, cleaning, laundry, medication management, personal finances, interpersonal relationship, driving, shopping, community activity, and yard work  PERSONAL FACTORS: Age, Past/current experiences, Time since onset of injury/illness/exacerbation, Transportation, and 3+ comorbidities: GERD, OA, R knee/hip pain, Barrett's esophagus  are also affecting patient's functional outcome.   REHAB POTENTIAL:  Good  CLINICAL DECISION MAKING: Evolving/moderate complexity  EVALUATION COMPLEXITY: Moderate  PLAN:  PT FREQUENCY: 2x/week  PT DURATION: 12 weeks  PLANNED INTERVENTIONS: Therapeutic exercises, Therapeutic activity, Neuromuscular re-education, Balance training, Gait training, Patient/Family education, Self Care, Joint mobilization, Joint manipulation, Stair training, Vestibular training, Canalith repositioning, Visual/preceptual remediation/compensation, Orthotic/Fit training, Electrical stimulation, Wheelchair mobility training, Spinal mobilization, Cryotherapy, Moist heat, Splintting, Taping, Ultrasound, Ionotophoresis 4mg /ml Dexamethasone, Manual therapy, and Re-evaluation  PLAN FOR NEXT SESSION:  Dynamic balance training endurance training Visual scanning tasks.  Variable gait training.    Cecile Sheerer, SPT    01/06/23, 8:33 AM

## 2023-01-06 NOTE — Progress Notes (Signed)
Physical Therapy Evaluation Patient Details Name: Mark Durham MRN: [id number] DOB: 04-25-1951 Today's Date: 01/06/2023  History of Present Illness  Mark Durham is a 72 y.o. 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  He has had residual physical and mild cognitive deficits since then.  He has right-sided weakness for which she follows up with outpatient PT.  He had right-sided visual loss as well with the stroke. 6/26 in the ED, CT head did not show any acute intracranial abnormality.  It showed chronic small vessel ischemic disease, diffuse ventricular prominence somewhat out of proportion to cortical sulcation, findings suspicious for NPH. CT angio of head did not show any large vessel occlusion or other emergent finding.  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  4 mm outpouching extending from the proximal cervical right ICA likely a small ulcerated plaque, similar to prior scan from 2022. PMH includes: GERD, OA, R knee/hip pain, Barrett's esophagus.    Clinical Impression  Patient arrived supine with wife at bedside. Patient agreeable to PT evaluation. At functional baseline, patient was requiring CGA with mobility tasks and assistance with ADLs; and receiving PT/OT/Speech services in Outpatient Clinic prior to admission. Upon evaluation, patient presents at baseline requiring CGA with transfers, and was able to ambulate small distance at bedside but limited due to lightheadedness. Vitals stable. Generalized weakness noted on RLE due to prior CVA. No further acute skilled PT needs. Patient left with all needs met and wife at bedside.       Recommendations for follow up therapy are one component of a multi-disciplinary discharge planning process, led by the attending physician.  Recommendations may be  updated based on patient status, additional functional criteria and insurance authorization.  Follow Up Recommendations       Assistance Recommended at Discharge Intermittent Supervision/Assistance  Patient can return home with the following  A lot of help with walking and/or transfers;A little help with bathing/dressing/bathroom;Assist for transportation;Help with stairs or ramp for entrance    Equipment Recommendations None recommended by PT  Recommendations for Other Services       Functional Status Assessment Patient has had a recent decline in their functional status and demonstrates the ability to make significant improvements in function in a reasonable and predictable amount of time.     Precautions / Restrictions Precautions Precautions: Fall Restrictions Weight Bearing Restrictions: No      Mobility  Bed Mobility Overal bed mobility: Modified Independent             General bed mobility comments: Pt supine <> EOB without assist; require increased time for completion    Transfers Overall transfer level: Needs assistance   Transfers: Sit to/from Stand Sit to Stand: Min guard           General transfer comment: Completed sit > stand from bed at CGA level without use of RW, mild dizziness. Vitals stable.    Ambulation/Gait Ambulation/Gait assistance: Min guard Gait Distance (Feet): 5 Feet Assistive device: None   Gait velocity: decreased     General Gait Details: completed lateral side stepping at edge of bed; lightheadedness reported requesting to sit therefore additional distance unable to be completed. Per Outpatient PT notes and wife reports patient seems at functional baseline  Careers information officer  Modified Rankin (Stroke Patients Only)       Balance Overall balance assessment: Needs assistance Sitting-balance support: Feet unsupported, No upper extremity supported Sitting balance-Leahy Scale: Good Sitting  balance - Comments: no LOB   Standing balance support: No upper extremity supported, During functional activity Standing balance-Leahy Scale: Fair Standing balance comment: mild postural sway without use of RW                             Pertinent Vitals/Pain Pain Assessment Pain Assessment: No/denies pain    Home Living Family/patient expects to be discharged to:: Private residence Living Arrangements: Spouse/significant other Available Help at Discharge: Family Type of Home: House Home Access: Stairs to enter Entrance Stairs-Rails: Can reach both Entrance Stairs-Number of Steps: 5   Home Layout: One level Home Equipment: Agricultural consultant (2 wheels);BSC/3in1;Wheelchair - manual;Shower seat      Prior Function Prior Level of Function : Needs assist  Cognitive Assist :  (cognitive deficits at baseline)     Physical Assist : ADLs (physical);Mobility (physical) Mobility (physical): Gait;Stairs;Transfers ADLs (physical): Grooming;Bathing;IADLs Mobility Comments: Wife present; She drives. Patient uses a RW for ambulation at baseline; requires Min Guard for mobility at baseline ADLs Comments: Wife reports requiring assistance with showers from at baseline; she helps with IADLs     Hand Dominance   Dominant Hand: Right    Extremity/Trunk Assessment        Lower Extremity Assessment Lower Extremity Assessment: Generalized weakness (residual weakness from prior CVA)       Communication   Communication: No difficulties  Cognition Arousal/Alertness: Awake/alert Behavior During Therapy: WFL for tasks assessed/performed Overall Cognitive Status: History of cognitive impairments - at baseline                                          General Comments      Exercises     Assessment/Plan    PT Assessment All further PT needs can be met in the next venue of care  PT Problem List Decreased balance;Decreased activity tolerance;Decreased  strength;Decreased mobility;Decreased cognition       PT Treatment Interventions      PT Goals (Current goals can be found in the Care Plan section)       Frequency       Co-evaluation               AM-PAC PT "6 Clicks" Mobility  Outcome Measure Help needed turning from your back to your side while in a flat bed without using bedrails?: None Help needed moving from lying on your back to sitting on the side of a flat bed without using bedrails?: None Help needed moving to and from a bed to a chair (including a wheelchair)?: A Little Help needed standing up from a chair using your arms (e.g., wheelchair or bedside chair)?: A Little Help needed to walk in hospital room?: A Little Help needed climbing 3-5 steps with a railing? : A Little 6 Click Score: 20    End of Session Equipment Utilized During Treatment: Gait belt Activity Tolerance: Patient tolerated treatment well (mild limitations due to lightheadedness; vitals stable)     PT Visit Diagnosis: Muscle weakness (generalized) (M62.81);Other abnormalities of gait and mobility (R26.89)    Time: 1478-2956 PT Time Calculation (min) (ACUTE ONLY): 18 min   Charges:  PT Evaluation $PT Eval Low Complexity: 1 Low          Howie Ill, PT, DPT 01/06/23 3:48 PM

## 2023-01-06 NOTE — Progress Notes (Signed)
SLP Cancellation Note  Patient Details Name: Mark Durham MRN: [id number] DOB: 02-28-1951   Cancelled treatment:       Reason Eval/Treat Not Completed: SLP screened, no needs identified, will sign off   Nissa Stannard 01/06/2023, 3:17 PM

## 2023-01-06 NOTE — Discharge Summary (Signed)
Physician Discharge Summary  Mark Durham JYN:[id number] DOB: 1951-07-07 DOA: 01/05/2023  PCP: [facility]  Admit date: 01/05/2023 Discharge date: 01/06/2023 Recommendations for Outpatient Follow-up:  Follow up with PCP in 1 weeks-call for appointment Please obtain BMP/CBC in one week  Discharge Dispo: HOME w/ outpatient PT Discharge Condition: Stable Code Status:   Code Status: Full Code Diet recommendation:  Diet Order             Diet regular Room service appropriate? 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  He has had residual physical and mild cognitive deficits since then.  He has right-sided weakness for which he follows up with outpatient PT.  He had right-sided visual loss as well with the stroke. He lives with his wife.  According to his wife, patient physically improved and is able to ambulate inside the house without any assistive device. Around 7 PM 01/05/23-patient complained of sudden onset headache.Wife also noticed slurred speech and more pronounced right-sided facial droop.When he stood up to walk, she noticed that he was weaker and limping on the right side.  She gave him aspirin.  Called EMS and brought to the ED.   In the ED, vitals are stable.  Labs showed mild hypokalemia otherwise unremarkable  CT head : no acute intracranial abnormality.showed chronic small vessel ischemic disease, diffuse ventricular prominence somewhat out of proportion to cortical sulcation, findings suspicious for NPH. CT angio of head did not show any LVO or other emergent finding.  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  4 mm outpouching extending from the proximal cervical right ICA likely a small  ulcerated plaque, similar to prior scan from 2022. Seen by teleneurology>who noted mild left facial weakness, mild dysarthria and possible left hemibody hypoesthesia.Because of persistent symptoms, patient was offered tPA which he refused. Aspirin had already been given prior to presentation.  Plavix loading dose was given Patient admitted for TIA workup MRI brain obtained: No acute finding, showed "Asymmetric T2/FLAIR signal intensity involving the right transverse and sigmoid sinuses as well as the right jugular bulb.While this finding is favored to reflect slow/sluggish flow, possible dural sinus thrombosis is difficult to exclude on this exam. Correlation with dedicated MRV, with and without contrast, suggested for further evaluation as clinically warranted" other findings with is related cerebral atrophy advanced chronic microvascular ischemic disease small remote left parietal infarct, 1 cm Mark Durham simple cyst in the posterior. Patient was admitted for further management> left facial deviation and right-sided weakness was better during admission. Seen by neurology MRI brain no acute finding question of dural sinus thrombosis due to sluggish blood flow but CT venogram was unremarkable. Patient can be discharged after completing stroke workup as per neurology Stroke workup unremarkable TTE stable, patient feels well.  Did well with PT OT ambulation and at this time cleared for discharge.  He is eager to go home   Discharge Diagnoses:  Principal Problem:   TIA (transient ischemic attack) Slurred speech right facial droop right-sided weakness worse from baseline TIA: MRI brain no acute finding, question abnormality ?dural sinus thrombosis. LDL at goal 40, A1c pending,  echo pending, neurology consult and admission.  Swallow screen done.  Patient was given Plavix load 6/26- fper neuro to cont home aspirin. If echo normal he can go home and fu PCP ?recrudescence of stroke symptoms, UA neg, cxr??  Pneumonia or bronchitis placed on empiric azithromycin  Multifocal intracranial artery stenosis CT angio head did not show any large vessel occlusion but showed diffuse intracranial atherosclerotic disease and chronic ischemic changes.  Neurology following continue plan with medical management   Essential hypertension PTA meds-amlodipine.  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   Visual impairment Has right-sided visual loss since the stroke.  Following up with ophthalmology  as OP   Left hypopharyngeal growth CT head also showed polypoid density at the posterior left hypopharynx at the level of the epiglottis, indeterminate. While this finding could reflect a small amount of retained secretions or retention cyst, a possible small soft tissue lesion is difficult to exclude. Correlation with direct visualization recommended by radiology.  Needs referral for ENT evaluation as an outpatient-this was instructed to the patient and provider ENT contact no for follow-up  Consults: NEUROLOGY Subjective: AAOX3, feels better overall.  Discharge Exam: Vitals:   01/06/23 1422 01/06/23 1600  BP: (!) 145/85 (!) 141/83  Pulse: 78 87  Resp: (!) 22 20  Temp:    SpO2: 98% 92%   General: Pt is alert, awake, not in acute distress Cardiovascular: RRR, S1/S2 +, no rubs, no gallops Respiratory: CTA bilaterally, no wheezing, no rhonchi Abdominal: Soft, NT, ND, bowel sounds + Extremities: no edema, no cyanosis  Discharge Instructions  Discharge Instructions     Discharge instructions   Complete by: As directed    Will need to follow-up with ENT for abnormal finding in the posterior left hypopharynx at the level of the epiglottis indeterminate could be due to retained secretions or retention cyst or lesion> needs visual examination at  ENT office.  Please call call MD or return to ER for similar or worsening recurring problem that brought you to hospital or if any fever,nausea/vomiting,abdominal pain, uncontrolled pain, chest pain,  shortness of breath or any other alarming symptoms.  Please follow-up your doctor as instructed in a week time and call the office for appointment.  Please avoid alcohol, smoking, or any other illicit substance and maintain healthy habits including taking your regular medications as prescribed.  You were cared for by a hospitalist during your hospital stay. If you have any questions about your discharge medications or the care you received while you were in the hospital after you are discharged, you can call the unit and ask to speak with the hospitalist on call if the hospitalist that took care of you is not available.  Once you are discharged, your primary care physician will handle any further medical issues. Please note that NO REFILLS for any discharge medications will be authorized once you are discharged, as it is imperative that you return to your primary care physician (or establish a relationship with a primary care physician if you do not have one) for your aftercare needs so that they can reassess your need for medications and monitor your lab values   Increase activity slowly   Complete by: As directed       Allergies as of 01/06/2023   No Known Allergies      Medication List     TAKE these medications  acetaminophen 500 MG tablet Commonly known as: TYLENOL Take 1,000 mg by mouth every 6 (six) hours as needed for moderate pain (as needed for pain). Take 2 tablets 3x per day as needed for pain   amLODipine 10 MG tablet Commonly known as: NORVASC Take 10 mg by mouth daily.   aspirin EC 81 MG tablet Take 81 mg by mouth daily. Swallow whole.   atorvastatin 80 MG tablet Commonly known as: LIPITOR Take 80 mg by mouth daily. Take 1 tablet by mouth daily   azithromycin 500  MG tablet Commonly known as: Zithromax Take 1 tablet (500 mg total) by mouth daily for 3 days.   diclofenac Sodium 1 % Gel Commonly known as: VOLTAREN Apply 2 g topically in the morning, at noon, and at bedtime. Apply 2g topically 3x daily as needed for arthritis left hip   Lacosamide 100 MG Tabs Take 200 mg by mouth 2 (two) times daily.   melatonin 3 MG Tabs tablet Take 3 mg by mouth at bedtime.   omeprazole 20 MG tablet Commonly known as: PRILOSEC OTC Take 20 mg by mouth daily. Take 1 tablet by mouth daily as needed.   polyethylene glycol 17 g packet Commonly known as: MIRALAX / GLYCOLAX Take 17 g by mouth daily. Drink by mouth daily as needed.   senna 8.6 MG Tabs tablet Commonly known as: SENOKOT Take 2 tablets by mouth at bedtime. Take 2 tablets by mouth nightly.   tadalafil 20 MG tablet Commonly known as: CIALIS Take 1 tablet (20 mg total) by mouth daily as needed for erectile dysfunction (1 hour prior to sexual activity).        Follow-up Information     [facility]   Specialty: Otolaryngology Why: Will need to follow-up with ENT for abnormal finding in the posterior left hypopharynx at the level of the epiglottis indeterminate could be due to retained secretions or retention cyst or lesion> needs visual examination at ENT office. Contact information: [address] [place] [address] [phone]        [facility] Follow up in 1 week(s).   Contact information: [address] [place] [address] [place] [address] [phone]                No Known Allergies  The results of significant diagnostics from this hospitalization (including imaging, microbiology, ancillary and laboratory) are listed below for reference.    Microbiology: No results found for this or any previous visit (from the past 240 hour(s)).  Procedures/Studies: DG Chest Port 1 View  Result Date: 01/06/2023 CLINICAL DATA:  Cough. EXAM:  PORTABLE CHEST 1 VIEW COMPARISON:  None Available. FINDINGS: Subtle interstitial opacities in the left lung. No confluent consolidation. No visible pleural effusions or pneumothorax per cardiomediastinal silhouette is within normal limits. Polyarticular degenerative change. IMPRESSION: Subtle interstitial opacities in the left lung could represent atelectasis or early pneumonia. No confluent consolidation. Electronically Signed   By: Feliberto Harts M.D.   On: 01/06/2023 11:49   CT VENOGRAM HEAD  Result Date: 01/06/2023 CLINICAL DATA:  72 year old male with TIA. Asymmetric noncontrast MRI signal associated with the right transverse, sigmoid venous sinuses and right IJ bulb and those venous structures not evaluated on CTA the day prior. EXAM: CT VENOGRAM HEAD TECHNIQUE: Venographic phase images of the brain were obtained following the administration of intravenous contrast. Multiplanar reformats and maximum intensity projections were generated. RADIATION DOSE REDUCTION: This exam was performed according to the departmental dose-optimization  program which includes automated exposure control, adjustment of the mA and/or kV according to patient size and/or use of iterative reconstruction technique. CONTRAST:  75mL OMNIPAQUE IOHEXOL 350 MG/ML SOLN COMPARISON:  Noncontrast brain MRI 0018 hours today. CTA head and neck 01/05/2023. FINDINGS: Venous sinuses: Maintained, normal postcontrast opacification of the superior sagittal sinus, torcula, bilateral transverse sinuses, bilateral sigmoid sinuses and bilateral IJ bulbs. Expected enhancement of the straight sinus, vein of Galen, inferior sagittal sinus, internal cerebral veins. Bilateral cavernous sinus enhancement is maintained. And other major cerebral draining cortical veins appear to be enhancing and patent. Anatomic variants: None significant. Other findings: Visible major arterial structures demonstrate continued enhancement. Stable background CT appearance of  brain parenchyma since yesterday. Review of the MIP images confirms the above findings IMPRESSION: Normal intracranial CT Venogram. Negative for venous sinus thrombosis. Electronically Signed   By: Odessa Fleming M.D.   On: 01/06/2023 09:29   MR BRAIN WO CONTRAST  Result Date: 01/06/2023 CLINICAL DATA:  Initial evaluation for TIA. EXAM: MRI HEAD WITHOUT CONTRAST TECHNIQUE: Multiplanar, multiecho pulse sequences of the brain and surrounding structures were obtained without intravenous contrast. COMPARISON:  Prior studies from 01/05/2023. FINDINGS: Brain: Diffuse prominence of the CSF containing spaces compatible with generalized cerebral atrophy. Patchy T2/FLAIR hyperintensity involving the periventricular and deep white matter both cerebral hemispheres as well as the pons, consistent with chronic small vessel ischemic disease, moderately advanced in nature. Small remote cortical infarct noted involving the left parietal lobe. No evidence for acute or subacute ischemia. Gray-white matter differentiation maintained. No acute or chronic intracranial blood products. No mass lesion or midline shift. Diffuse ventricular prominence, most like related to global parenchymal volume loss. No hydrocephalus. No extra-axial fluid collection. 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 Suprasellar region normal. Vascular: Major intracranial arterial vascular flow voids are maintained. There is asymmetric T2/FLAIR signal intensity involving the right transverse and sigmoid sinuses as well as the right jugular bulb. While this finding is favored to reflect slow/sluggish flow, possible dural sinus thrombosis is difficult to exclude on this exam. Skull and upper cervical spine: Degenerative osteoarthritic changes noted about the C1-2 articulation. Bone marrow signal intensity normal. No scalp soft tissue abnormality. Sinuses/Orbits: Prior ocular lens  replacement with axial myopia on the right. No acute finding. Paranasal sinuses are clear. No significant mastoid effusion. Other: None. IMPRESSION: 1. No acute intracranial infarct. 2. Asymmetric T2/FLAIR signal intensity involving the right transverse and sigmoid sinuses as well as the right jugular bulb. While this finding is favored to reflect slow/sluggish flow, possible dural sinus thrombosis is difficult to exclude on this exam. Correlation with dedicated MRV, with and without contrast, suggested for further evaluation as clinically warranted. 3. Age-related cerebral atrophy with moderately advanced chronic microvascular ischemic disease. Small remote left parietal infarct. 4. 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 Correlation with pituitary function tests suggested. Additionally, further evaluation with nonemergent pituitary protocol MRI could be performed for further evaluation as warranted. Electronically Signed   By: Rise Mu M.D.   On: 01/06/2023 01:07   CT ANGIO HEAD NECK W WO CM  Result Date: 01/05/2023 CLINICAL DATA:  Initial evaluation for neuro deficit, slurred speech. EXAM: CT ANGIOGRAPHY HEAD AND NECK WITH AND WITHOUT CONTRAST TECHNIQUE: Multidetector CT imaging of the head and neck was performed using the standard protocol during bolus administration of intravenous contrast.  Multiplanar CT image reconstructions and MIPs were obtained to evaluate the vascular anatomy. Carotid stenosis measurements (when applicable) are obtained utilizing NASCET criteria, using the distal internal carotid diameter as the denominator. RADIATION DOSE REDUCTION: This exam was performed according to the departmental dose-optimization program which includes automated exposure control, adjustment of the mA and/or kV according to patient size and/or use of iterative reconstruction technique. CONTRAST:  75mL OMNIPAQUE IOHEXOL  350 MG/ML SOLN COMPARISON:  CT from earlier the same day as well as prior exam from 07/31/2020. FINDINGS: CTA NECK FINDINGS Aortic arch: Partially visualized aortic arch normal. Bovine branching pattern noted. Mild atheromatous change about the arch itself. No stenosis about the origin the great vessels. Right carotid system: Right common and internal carotid arteries are patent without dissection. Mild atheromatous irregularity about the right carotid bulb without significant stenosis. 4 mm outpouching extending from the proximal cervical right ICA, likely a small ulcerative plaque, similar to prior (series 5, image 406), similar prior. Left carotid system: Left common and internal carotid arteries are patent without dissection. Mild atheromatous change about the left carotid bulb without hemodynamically significant stenosis. Vertebral arteries: Both vertebral arteries arise from subclavian arteries. No proximal subclavian artery stenosis. Right vertebral artery dominant. Vertebral arteries are patent without significant stenosis or dissection. Skeleton: No discrete or worrisome osseous lesions. Moderate multilevel cervical spondylosis. Other neck: Mildly prominent left level 3 lymph nodes measure up to 1.6 cm (series 4, image 67), indeterminate. There is an apparent polypoid density at the posterior left hypopharynx at the level of the epiglottis (series 4, image 70). While this finding could reflect a small amount of retained secretions or retention cyst, a possible small soft tissue lesion is difficult to exclude. This was present on prior. Upper chest: Emphysema. Partially calcified 8 mm nodule at the left upper lobe is stable as compared to 2022, consistent with a benign finding. Review of the MIP images confirms the above findings CTA HEAD FINDINGS Anterior circulation: Atheromatous change about the carotid siphons bilaterally. Associated mild to moderate narrowing at the para clinoid right ICA. There is a  focal severe stenosis at the contralateral left carotid siphon (series 7, image 118). A1 segments patent bilaterally. Left A1 hypoplastic. Normal anterior communicating complex. Anterior cerebral arteries patent without stenosis. M1 segments mildly irregular but patent without significant stenosis or occlusion. Severe proximal right M2 stenosis noted (series 9, image 22). Overall, the right MCA branches are patent but attenuated as compared to the left, similar to prior. Posterior circulation: Dominant right V4 segment patent without stenosis. Left V4 segment mildly attenuated. Severe diffuse narrowing of the distal left V4 segment just prior to the vertebrobasilar junction (series 5, image 254). Basilar patent without stenosis. Superior cerebral arteries patent bilaterally. Both PCAs primarily supplied via the basilar. Atheromatous irregularity about the PCAs which remain patent to their distal aspects. Venous sinuses: Not well assessed due to arterial timing the contrast bolus. Anatomic variants: As above.  No aneurysm. Review of the MIP images confirms the above findings IMPRESSION: 1. Negative CTA for large vessel occlusion or other emergent finding. 2. Intracranial atherosclerotic disease, with most notable findings including severe proximal right M2 and distal left V4 stenoses, with additional severe stenosis at the cavernous left ICA. Right MCA branches are overall attenuated as compared to the left but remain patent. 3. 4 mm outpouching extending from the proximal cervical right ICA, likely a small ulcerative plaque, similar to prior. 4. Polypoid density at the posterior left hypopharynx at the level  of the epiglottis, indeterminate. While this finding could reflect a small amount of retained secretions or retention cyst, a possible small soft tissue lesion is difficult to exclude. Correlation with direct visualization recommended. 5. Mildly prominent left level 3 lymph nodes measure up to 1.6 cm,  indeterminate. Aortic Atherosclerosis (ICD10-I70.0) and Emphysema (ICD10-J43.9). Electronically Signed   By: Rise Mu M.D.   On: 01/05/2023 22:23   CT HEAD CODE STROKE WO CONTRAST  Result Date: 01/05/2023 CLINICAL DATA:  Code stroke. Initial evaluation for neuro deficit, stroke. Left-sided deficits. EXAM: CT HEAD WITHOUT CONTRAST TECHNIQUE: Contiguous axial images were obtained from the base of the skull through the vertex without intravenous contrast. RADIATION DOSE REDUCTION: This exam was performed according to the departmental dose-optimization program which includes automated exposure control, adjustment of the mA and/or kV according to patient size and/or use of iterative reconstruction technique. COMPARISON:  Prior study from 07/31/2020. FINDINGS: Brain: Age-related cerebral atrophy with chronic small vessel ischemic disease. No acute intracranial hemorrhage. No acute large vessel territory infarct. No mass lesion, midline shift or mass effect. Diffuse ventricular prominence, somewhat out of proportion to cortical sulcation. While this finding could be related atrophy, a degree of NPH could be contributory. No extra-axial fluid collection. Vascular: No convincing abnormal hyperdense vessel. Scattered vascular calcifications noted within the carotid siphons. Skull: Scalp soft tissues and calvarium demonstrate no acute finding. Sinuses/Orbits: Prior ocular lens replacement on the right. Globes orbital soft tissues demonstrate no acute finding. Paranasal sinuses are largely clear. No mastoid effusion. Other: None. ASPECTS [facility] Stroke Program Early CT Score) - Ganglionic level infarction (caudate, lentiform nuclei, internal capsule, insula, M1-M3 cortex): 7 - Supraganglionic infarction (M4-M6 cortex): 3 Total score (0-10 with 10 being normal): 10 IMPRESSION: 1. No acute intracranial abnormality. 2. ASPECTS is 10. 3. Age-related cerebral atrophy with chronic small vessel ischemic disease. 4.  Diffuse ventricular prominence somewhat out of proportion to cortical sulcation. While this finding could be related underlying atrophy, a degree of NPH could be contributory, and could be considered in the correct clinical setting. Critical Value/emergent results were called by telephone at the time of interpretation on 01/05/2023 at 8:11 pm to provider [facility] , who verbally acknowledged these results. Electronically Signed   By: Rise Mu M.D.   On: 01/05/2023 20:16    Labs: BNP (last 3 results) No results for input(s): "BNP" in the last 8760 hours. Basic Metabolic Panel: Recent Labs  Lab 01/05/23 2005 01/06/23 0519  NA 137 137  K 3.3* 3.3*  CL 104 106  CO2 23 24  GLUCOSE 125* 111*  BUN 16 13  CREATININE 0.99 0.78  CALCIUM 9.4 9.3   Liver Function Tests: Recent Labs  Lab 01/05/23 2005  AST 17  ALT 18  ALKPHOS 129*  BILITOT 0.9  PROT 8.8*  ALBUMIN 4.4   No results for input(s): "LIPASE", "AMYLASE" in the last 168 hours. No results for input(s): "AMMONIA" in the last 168 hours. CBC: Recent Labs  Lab 01/05/23 2005 01/06/23 0519  WBC 7.9 7.6  NEUTROABS 4.2  --   HGB 14.6 13.8  HCT 46.6 43.6  MCV 81.8 81.3  PLT 153 244   Cardiac Enzymes: No results for input(s): "CKTOTAL", "CKMB", "CKMBINDEX", "TROPONINI" in the last 168 hours. BNP: Invalid input(s): "POCBNP" CBG: Recent Labs  Lab 01/05/23 1952  GLUCAP 134*   D-Dimer No results for input(s): "DDIMER" in the last 72 hours. Hgb A1c No results for input(s): "HGBA1C" in the last 72 hours. Lipid Profile  Recent Labs    01/06/23 0519  CHOL 80  HDL 27*  LDLCALC 40  TRIG 64  CHOLHDL 3.0   Thyroid function studies Recent Labs    01/06/23 0519  TSH 1.182   Anemia work up No results for input(s): "VITAMINB12", "FOLATE", "FERRITIN", "TIBC", "IRON", "RETICCTPCT" in the last 72 hours. Urinalysis    Component Value Date/Time   COLORURINE YELLOW (A) 01/06/2023 1202   APPEARANCEUR CLEAR (A)  01/06/2023 1202   APPEARANCEUR Clear 04/15/2021 0908   LABSPEC 1.044 (H) 01/06/2023 1202   PHURINE 6.0 01/06/2023 1202   GLUCOSEU NEGATIVE 01/06/2023 1202   HGBUR NEGATIVE 01/06/2023 1202   BILIRUBINUR NEGATIVE 01/06/2023 1202   BILIRUBINUR Negative 04/15/2021 0908   KETONESUR NEGATIVE 01/06/2023 1202   PROTEINUR NEGATIVE 01/06/2023 1202   NITRITE NEGATIVE 01/06/2023 1202   LEUKOCYTESUR NEGATIVE 01/06/2023 1202   Sepsis Labs Recent Labs  Lab 01/05/23 2005 01/06/23 0519  WBC 7.9 7.6   Microbiology No results found for this or any previous visit (from the past 240 hour(s)).   Time coordinating discharge: 25 minutes  SIGNED: Lanae Boast, MD  [facility] 01/06/2023, 4:22 PM  If 7PM-7AM, please contact night-coverage [email or web address]

## 2023-01-06 NOTE — Consult Note (Signed)
Neurology Consultation  Reason for Consult: Facial droop and slurred speech Referring Physician: Dr. Early Chars  CC: Facial droop and slurred speech  History is obtained from: Patient, chart  HPI: Mark Durham is a 72 y.o. male who has a past medical history of a prior stroke which happened in January 2024 where he had dizziness and right arm weakness, aphasia and right-sided visual field cut and was found to have an acute ischemic stroke and started on aspirin and statin.  He had new onset seizures while in the hospital captured on EEG.  He was discharged home on aspirin, statin for stroke and Keppra and Vimpat for the seizures. CTA head and neck at that time was unremarkable for occlusion but showed a right ICA aneurysm.  MRI of the brain showed a left PCA territory and thalamic area of restricted diffusion with partial involvement of the left MCA PCA watershed territory as well.  There was diffuse volume loss and flair changes with moderate small vessel disease.  Ventriculomegaly ex vacuo was also noted per the report.  These are charts from outside hospital in Care Everywhere.  He was brought in yesterday to the emergency department via EMS due to right-sided facial droop right upper extremity drift and slurred speech with a last known well time of 6:30 PM.  At that time he also had a sudden onset of a headache.  Also noted to have difficulty walking with right-sided weakness and right-sided limp.  No witnessed seizure activity.  No loss of consciousness.  He was evaluated by telestroke service-for what is documented as concern for left-sided facial droop and slurred speech but on the telemedicine doctor's evaluation, he did have mild dysarthria, left-sided hypoesthesia inconsistently and right visual field cut which was chronic.  Admitted for further workup with imaging and formal neurological consultation after CT head and CTA head and neck were unremarkable for acute process.  This morning  reports feeling better.  Reports no focal symptoms.   LKW: 1830 hrs. on 12/30/2022 IV thrombolysis given?: no, documented reason and the initial consult note-refused by family or patient. EVT: No ELVO Premorbid modified Rankin scale (mRS):2   ROS: Full ROS was performed and is negative except as noted in the HPI.   Past Medical History:  Diagnosis Date   Hypertension     History reviewed. No pertinent family history.  Social History:   reports that he has been smoking. He has been exposed to tobacco smoke. He has never used smokeless tobacco. He reports that he does not drink alcohol and does not use drugs.  Medications  Current Facility-Administered Medications:     stroke: early stages of recovery book, , Does not apply, Once, Dahal, Binaya, MD   acetaminophen (TYLENOL) tablet 650 mg, 650 mg, Oral, Q6H PRN **OR** acetaminophen (TYLENOL) suppository 650 mg, 650 mg, Rectal, Q6H PRN, Dahal, Binaya, MD   albuterol (PROVENTIL) (2.5 MG/3ML) 0.083% nebulizer solution 2.5 mg, 2.5 mg, Nebulization, Q6H, Dahal, Binaya, MD   enoxaparin (LOVENOX) injection 50 mg, 50 mg, Subcutaneous, Q24H, Dahal, Binaya, MD   hydrALAZINE (APRESOLINE) injection 10 mg, 10 mg, Intravenous, Q6H PRN, Dahal, Binaya, MD   polyethylene glycol (MIRALAX / GLYCOLAX) packet 17 g, 17 g, Oral, Daily PRN, Dahal, Binaya, MD   senna (SENOKOT) tablet 8.6 mg, 1 tablet, Oral, BID, Dahal, Binaya, MD, 8.6 mg at 01/05/23 2331  Current Outpatient Medications:    acetaminophen (TYLENOL) 500 MG tablet, Take 1,000 mg by mouth every 6 (six) hours as needed  for moderate pain (as needed for pain). Take 2 tablets 3x per day as needed for pain, Disp: , Rfl:    amLODipine (NORVASC) 10 MG tablet, Take 10 mg by mouth daily., Disp: , Rfl:    amLODipine (NORVASC) 5 MG tablet, Take 1 tablet (5 mg total) by mouth daily. (Patient not taking: Reported on 11/08/2022), Disp: 30 tablet, Rfl: 11   aspirin EC 81 MG tablet, Take 81 mg by mouth daily.  Swallow whole., Disp: , Rfl:    atorvastatin (LIPITOR) 80 MG tablet, Take 80 mg by mouth daily. Take 1 tablet by mouth daily, Disp: , Rfl:    diclofenac Sodium (VOLTAREN) 1 % GEL, Apply 2 g topically in the morning, at noon, and at bedtime. Apply 2g topically 3x daily as needed for arthritis left hip, Disp: , Rfl:    Lacosamide 100 MG TABS, Take 100 mg by mouth 2 (two) times daily. Take 1 tablet (100mg  total) by mouth 2 times a day (Patient not taking: Reported on 11/15/2022), Disp: , Rfl:    melatonin 3 MG TABS tablet, Take 3 mg by mouth at bedtime., Disp: , Rfl:    omeprazole (PRILOSEC OTC) 20 MG tablet, Take 20 mg by mouth daily. Take 1 tablet by mouth daily as needed., Disp: , Rfl:    polyethylene glycol (MIRALAX / GLYCOLAX) 17 g packet, Take 17 g by mouth daily. Drink by mouth daily as needed., Disp: , Rfl:    senna (SENOKOT) 8.6 MG TABS tablet, Take 2 tablets by mouth at bedtime. Take 2 tablets by mouth nightly., Disp: , Rfl:    tadalafil (CIALIS) 20 MG tablet, Take 1 tablet (20 mg total) by mouth daily as needed for erectile dysfunction (1 hour prior to sexual activity). (Patient not taking: Reported on 11/15/2022), Disp: 30 tablet, Rfl: 6  Exam: Current vital signs: BP 127/67   Pulse 84   Temp 98.1 F (36.7 C) (Oral)   Resp 11   Ht 5\' 10"  (1.778 m)   Wt 98.2 kg   SpO2 97%   BMI 31.06 kg/m  Vital signs in last 24 hours: Temp:  [98.1 F (36.7 C)-98.2 F (36.8 C)] 98.1 F (36.7 C) (06/27 0655) Pulse Rate:  [73-92] 84 (06/27 0630) Resp:  [11-21] 11 (06/27 0630) BP: (127-151)/(54-93) 127/67 (06/27 0630) SpO2:  [95 %-98 %] 97 % (06/27 0630) Weight:  [98.2 kg-98.3 kg] 98.2 kg (06/26 2005) General: Awake alert oriented to self, fact that he is in the hospital, month but not the year. HEENT: Normocephalic atraumatic Lungs: Clear Cardiovascular: Regular rhythm Abdomen nondistended nontender Neurologic exam He is awake alert oriented to person, place and month but not the year. No  dysarthria No aphasia Appears to have somewhat of reduced attention concentration Cranials: Pupils equal round react light, extraocular movements appear unhindered, appears to have a partial right-sided field cut, face appears symmetric although at rest his face is a little asymmetric but when he smiles, he is able to smile equally and nasolabial folds also appear symmetric.  Sensation in the face is intact.  Shoulder shrug intact.  Tongue and palate midline. Motor examination with no drift in any of the 4 extremities. Sensation intact Coordination with no dysmetria  NIHSS 1a Level of Conscious.: 0 1b LOC Questions: 0 1c LOC Commands: 0 2 Best Gaze: 0 3 Visual: 1 4 Facial Palsy: 0 5a Motor Arm - left: 0 5b Motor Arm - Right: 0 6a Motor Leg - Left: 0 6b Motor Leg - Right:  0 7 Limb Ataxia: 0 8 Sensory: 0 9 Best Language: 0 10 Dysarthria: 0 11 Extinct. and Inatten.: 0 TOTAL: 1   Labs I have reviewed labs in epic and the results pertinent to this consultation are:  CBC    Component Value Date/Time   WBC 7.6 01/06/2023 0519   RBC 5.36 01/06/2023 0519   HGB 13.8 01/06/2023 0519   HCT 43.6 01/06/2023 0519   PLT 244 01/06/2023 0519   MCV 81.3 01/06/2023 0519   MCH 25.7 (L) 01/06/2023 0519   MCHC 31.7 01/06/2023 0519   RDW 14.6 01/06/2023 0519   LYMPHSABS 2.8 01/05/2023 2005   MONOABS 0.7 01/05/2023 2005   EOSABS 0.2 01/05/2023 2005   BASOSABS 0.0 01/05/2023 2005    CMP     Component Value Date/Time   NA 137 01/06/2023 0519   K 3.3 (L) 01/06/2023 0519   CL 106 01/06/2023 0519   CO2 24 01/06/2023 0519   GLUCOSE 111 (H) 01/06/2023 0519   BUN 13 01/06/2023 0519   CREATININE 0.78 01/06/2023 0519   CALCIUM 9.3 01/06/2023 0519   PROT 8.8 (H) 01/05/2023 2005   ALBUMIN 4.4 01/05/2023 2005   AST 17 01/05/2023 2005   ALT 18 01/05/2023 2005   ALKPHOS 129 (H) 01/05/2023 2005   BILITOT 0.9 01/05/2023 2005   GFRNONAA >60 01/06/2023 0519   GFRAA >60 09/20/2017 2344     Lipid Panel     Component Value Date/Time   CHOL 80 01/06/2023 0519   TRIG 64 01/06/2023 0519   HDL 27 (L) 01/06/2023 0519   CHOLHDL 3.0 01/06/2023 0519   VLDL 13 01/06/2023 0519   LDLCALC 40 01/06/2023 0519    A1c-pending  Imaging I have reviewed the images obtained:  CT-head-no acute changes CT angiography head and neck: Negative for LVO.  Intracranial atherosclerosis with most notable findings including severe proximal right M2 and distal left V4 stenosis with additional severe stenosis in the cavernous left ICA, right MCA branches are overall attenuated compared to the left but remain patent.  4 mm outpouching from the proximal cervical right ICA, likely a small ulcerative plaque similar to prior. MRI examination of the brain: No acute infarction.  Asymmetric FLAIR signal intensity in the right transverse and sigmoid sinus as well as the right jugular bulb-question CVST-recommend venogram. CT venogram with no evidence of CVST.  Assessment:  72 year old man with past medical history of a prior stroke with residual right-sided hemianopsia and mild sensory deficits on the right side presenting for evaluation of slurred speech and possible facial droop which have since resolved. Symptoms could have been due to recrudescence of old stroke symptoms in the setting of an acute illness versus TIA.  His history of seizures also raises suspicion of this being a postictal weakness. Currently seems to be back to his baseline. A1c ending 2D echo pending  Impression: Stroke symptoms recrudescence versus TIA Less likely seizure  Recommendations:  From a stroke/TIA perspective: - Follow-up echocardiogram - Follow-up A1c-goal less than 7 -LDL is 40-at goal.  Continue home statin - PT/OT/speech therapy assessments -I will check a UA and chest x-ray to make sure there was no underlying infection that caused recrudescence of old stroke symptoms. - Follow-up with outpatient neurology at  [facility] as planned.  From a seizure perspective: - Continue home antiepileptics - Maintain seizure precautions - Follow-up with outpatient neurology at [facility]  Incidental MRI finding of slow flow in the right sigmoid as well as jugular was followed up by  a CT venogram of the head-no evidence of CVST.  Please reach back out if the 2D echocardiogram has any concerning findings.  Plan was relayed to Dr. Jonathon Bellows.  -- Milon Dikes, MD Neurologist [facility] Pager: [phone]

## 2023-01-06 NOTE — Progress Notes (Signed)
Occupational Therapy Evaluation Patient Details Name: Mark Durham MRN: [id number] DOB: [birth year] Today's Date: 01/06/2023   History of Present Illness Mark Durham is a 72 y.o. 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  He has had residual physical and mild cognitive deficits since then.  He has right-sided weakness for which she follows up with outpatient PT.  He had right-sided visual loss as well with the stroke. 6/26 in the ED, CT head did not show any acute intracranial abnormality.  It showed chronic small vessel ischemic disease, diffuse ventricular prominence somewhat out of proportion to cortical sulcation, findings suspicious for NPH. CT angio of head did not show any large vessel occlusion or other emergent finding.  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  4 mm outpouching extending from the proximal cervical right ICA likely a small ulcerated plaque, similar to prior scan from 2022. PMH includes: GERD, OA, R knee/hip pain, Barrett's esophagus.   Clinical Impression   Pt was seen for OT evaluation this date. At baseline, pt requires CGA with mobility and assistance with ADLs; receiving OT/PT/SLP services in outpatient clinic PTA. Upon OT evaluation this date, pt presents at baseline but limited to dizziness for full ADL observation. Some PLOF Hx taken from chart as pt poor historian. VSS. Cognition, RUE and vision deficits from prior CVA. Functional transfer not assessed due to pt's safety (c/o dizziness seated EOB). No further acute skilled OT needs, OT will sign off. Pt left with all needs met, discussed with RN mobility status.      Recommendations for follow up therapy are one component of a multi-disciplinary discharge planning process, led by the attending physician.  Recommendations may  be updated based on patient status, additional functional criteria and insurance authorization.   Assistance Recommended at Discharge Intermittent Supervision/Assistance  Patient can return home with the following A little help with walking and/or transfers;A little help with bathing/dressing/bathroom;Assistance with cooking/housework;Direct supervision/assist for medications management;Assistance with feeding;Direct supervision/assist for financial management;Help with stairs or ramp for entrance;Assist for transportation    Functional Status Assessment  Patient has had a recent decline in their functional status and/or demonstrates limited ability to make significant improvements in function in a reasonable and predictable amount of time  Equipment Recommendations  None recommended by OT       Precautions / Restrictions Precautions Precautions: Fall Restrictions Weight Bearing Restrictions: No      Mobility Bed Mobility Overal bed mobility: Modified Independent             General bed mobility comments: Pt supine <> EOB without assist    Transfers                   General transfer comment: Did not attempt transfers d/t c/o dizziness EOB      Balance Overall balance assessment: Needs assistance Sitting-balance support: Feet unsupported, No upper extremity supported   Sitting balance - Comments: Sits EOB without support, OT SBA for safety, no LOB                       High Level Balance Comments: Pt reports running into items on R side at baseline           ADL either performed or assessed with clinical judgement   ADL Overall ADL's : At baseline;Needs assistance/impaired  Eating/Feeding: Set up (Baseline needed A for cutting food)   Grooming: Set up;Cueing for compensatory techniques                               Functional mobility during ADLs:  (Transfer not attempted during eval; pt with interminnet dizziness seated EOB. Did  not attempt due to pt safety.) General ADL Comments: Pt primarily limited by baseline impairments d/t PMH of CVA. R visual deficits, impaired spatial awareness, decreased R hemibody use impacting effectiveness of bimanual task peformance result in impaired ADLs. Anticipate pt is near baseline with ADL performance - would likely require cues for incorporation of hemibody in all self-care routines. MIN A for vcs, likely CGA for physical performance of ADLs. MINA-CGA for transfers (which is baseline).     Vision Baseline Vision/History: 1 Wears glasses Ability to See in Adequate Light: 3 Highly impaired Patient Visual Report: Blurring of vision;Other (comment) (Recently saw neuro-opthmology; chart review with no VF deficits but noted blurriness of vision, visual perceptual deficits which impact functionality) Vision Assessment?: Vision impaired- to be further tested in functional context Additional Comments: R inattention and difficulties with spatial awareness of hemibody which impact use of visual - motor integration     Perception Perception Perception Tested?: Yes Perception Deficits: Inattention/neglect Inattention/Neglect:  (Mild R inattention)       Pertinent Vitals/Pain Pain Assessment Pain Assessment: No/denies pain     Hand Dominance Right   Extremity/Trunk Assessment Upper Extremity Assessment Upper Extremity Assessment: RUE deficits/detail RUE Deficits / Details: R inattention; R hand with digits in flexion, ROM WFL (able to reach behind head and towards toes semi-supine) RUE Sensation: decreased proprioception RUE Coordination: decreased fine motor;decreased gross motor (Finger-to-nose with depth perception difficulties (visual deficits baseline), bradykinetic movements overall. Poor spatial awareness of hemibody.)   Lower Extremity Assessment Lower Extremity Assessment: Overall WFL for tasks assessed;Generalized weakness (Able to transition into hooklying position; can  perform ankle pumps and seated marches EOB)       Communication Communication Communication: No difficulties   Cognition Arousal/Alertness: Awake/alert Behavior During Therapy: WFL for tasks assessed/performed Overall Cognitive Status: History of cognitive impairments - at baseline                                 General Comments: Pt reports date as 2014, but quickly corrects to 2024, A&0x4, is able to recall names of staff recently in room and tells this writer name of his PT in outpatient. Mild confusion with timeline; overall pt poor historian regarding PLOF                Home Living Family/patient expects to be discharged to:: Private residence Living Arrangements: Spouse/significant other Available Help at Discharge: Family Type of Home: House Home Access: Stairs to enter (5) Secretary/administrator of Steps: 5   Home Layout: One level     Bathroom Shower/Tub: Producer, television/film/video: Standard Bathroom Accessibility: Yes   Home Equipment: Agricultural consultant (2 wheels);BSC/3in1;Wheelchair - Manufacturing systems engineer          Prior Functioning/Environment Prior Level of Function : Needs assist  Cognitive Assist :  (Cognitive impairments at baseline (difficulties identifying numbers, letters))     Physical Assist : ADLs (physical);Mobility (physical) Mobility (physical): Gait;Stairs;Transfers ADLs (physical): Grooming;Bathing;IADLs Mobility Comments: Pt poor historian - reports he was driving PTA, and using manual wc (chart  review indicates otherwise - spouse was driving, and pt used RW with CGA as reported by outpt PT) ADLs Comments: Pt reports requiring assistance with showers from spouse at baseline; she helps with IADLs         AM-PAC OT "6 Clicks" Daily Activity     Outcome Measure Help from another person eating meals?: None Help from another person taking care of personal grooming?: A Little Help from another person toileting, which  includes using toliet, bedpan, or urinal?: A Little Help from another person bathing (including washing, rinsing, drying)?: A Little Help from another person to put on and taking off regular upper body clothing?: A Little Help from another person to put on and taking off regular lower body clothing?: A Little 6 Click Score: 19   End of Session Nurse Communication: Mobility status;Other (comment) (Vital signs)  Activity Tolerance: Patient tolerated treatment well Patient left: in bed;with call bell/phone within reach  OT Visit Diagnosis: Hemiplegia and hemiparesis;Muscle weakness (generalized) (M62.81);Other abnormalities of gait and mobility (R26.89);Other symptoms and signs involving the nervous system (R29.898) Hemiplegia - Right/Left: Right Hemiplegia - dominant/non-dominant: Dominant Hemiplegia - caused by: Cerebral infarction                Time: 2130-8657 OT Time Calculation (min): 21 min Charges:  OT General Charges $OT Visit: 1 Visit OT Evaluation $OT Eval Moderate Complexity: 1 Mod  Janiyla Long L. Mikai Meints, OTR/L  01/06/23, 11:26 AM

## 2023-01-07 LAB — HEMOGLOBIN A1C
Hgb A1c MFr Bld: 5.7 % — ABNORMAL HIGH (ref 4.8–5.6)
Mean Plasma Glucose: 117 mg/dL

## 2023-01-09 ENCOUNTER — Other Ambulatory Visit: Payer: Self-pay

## 2023-01-09 ENCOUNTER — Emergency Department: Payer: BC Managed Care – PPO

## 2023-01-09 ENCOUNTER — Encounter: Payer: Self-pay | Admitting: Radiology

## 2023-01-09 ENCOUNTER — Inpatient Hospital Stay
Admission: EM | Admit: 2023-01-09 | Discharge: 2023-01-12 | DRG: 872 | Disposition: A | Discharge status: Home / Self Care | Payer: BC Managed Care – PPO | Attending: Student | Admitting: Student

## 2023-01-09 DIAGNOSIS — E876 Hypokalemia: Secondary | ICD-10-CM | POA: Diagnosis present

## 2023-01-09 DIAGNOSIS — N3000 Acute cystitis without hematuria: Secondary | ICD-10-CM | POA: Diagnosis not present

## 2023-01-09 DIAGNOSIS — J189 Pneumonia, unspecified organism: Secondary | ICD-10-CM | POA: Diagnosis present

## 2023-01-09 DIAGNOSIS — A419 Sepsis, unspecified organism: Secondary | ICD-10-CM | POA: Diagnosis not present

## 2023-01-09 DIAGNOSIS — I10 Essential (primary) hypertension: Secondary | ICD-10-CM | POA: Diagnosis present

## 2023-01-09 DIAGNOSIS — Z96641 Presence of right artificial hip joint: Secondary | ICD-10-CM | POA: Diagnosis present

## 2023-01-09 DIAGNOSIS — Z6831 Body mass index (BMI) 31.0-31.9, adult: Secondary | ICD-10-CM | POA: Diagnosis not present

## 2023-01-09 DIAGNOSIS — R6520 Severe sepsis without septic shock: Secondary | ICD-10-CM | POA: Diagnosis present

## 2023-01-09 DIAGNOSIS — Z7902 Long term (current) use of antithrombotics/antiplatelets: Secondary | ICD-10-CM

## 2023-01-09 DIAGNOSIS — Z1152 Encounter for screening for COVID-19: Secondary | ICD-10-CM | POA: Diagnosis not present

## 2023-01-09 DIAGNOSIS — J40 Bronchitis, not specified as acute or chronic: Secondary | ICD-10-CM | POA: Diagnosis present

## 2023-01-09 DIAGNOSIS — N390 Urinary tract infection, site not specified: Secondary | ICD-10-CM | POA: Diagnosis present

## 2023-01-09 DIAGNOSIS — E872 Acidosis, unspecified: Secondary | ICD-10-CM | POA: Diagnosis present

## 2023-01-09 DIAGNOSIS — R197 Diarrhea, unspecified: Secondary | ICD-10-CM | POA: Diagnosis not present

## 2023-01-09 DIAGNOSIS — F17210 Nicotine dependence, cigarettes, uncomplicated: Secondary | ICD-10-CM | POA: Diagnosis present

## 2023-01-09 DIAGNOSIS — I69398 Other sequelae of cerebral infarction: Secondary | ICD-10-CM

## 2023-01-09 DIAGNOSIS — Z720 Tobacco use: Secondary | ICD-10-CM | POA: Diagnosis not present

## 2023-01-09 DIAGNOSIS — E785 Hyperlipidemia, unspecified: Secondary | ICD-10-CM | POA: Diagnosis present

## 2023-01-09 DIAGNOSIS — E669 Obesity, unspecified: Secondary | ICD-10-CM | POA: Diagnosis present

## 2023-01-09 DIAGNOSIS — A4151 Sepsis due to Escherichia coli [E. coli]: Secondary | ICD-10-CM | POA: Diagnosis present

## 2023-01-09 DIAGNOSIS — Z7982 Long term (current) use of aspirin: Secondary | ICD-10-CM | POA: Diagnosis not present

## 2023-01-09 DIAGNOSIS — Z8249 Family history of ischemic heart disease and other diseases of the circulatory system: Secondary | ICD-10-CM

## 2023-01-09 DIAGNOSIS — G40909 Epilepsy, unspecified, not intractable, without status epilepticus: Secondary | ICD-10-CM | POA: Diagnosis present

## 2023-01-09 DIAGNOSIS — I69354 Hemiplegia and hemiparesis following cerebral infarction affecting left non-dominant side: Secondary | ICD-10-CM | POA: Diagnosis not present

## 2023-01-09 DIAGNOSIS — Z79899 Other long term (current) drug therapy: Secondary | ICD-10-CM | POA: Diagnosis not present

## 2023-01-09 DIAGNOSIS — I639 Cerebral infarction, unspecified: Secondary | ICD-10-CM | POA: Diagnosis present

## 2023-01-09 LAB — COMPREHENSIVE METABOLIC PANEL WITH GFR
ALT: 13 U/L (ref 0–44)
AST: 16 U/L (ref 15–41)
Albumin: 3.6 g/dL (ref 3.5–5.0)
Alkaline Phosphatase: 86 U/L (ref 38–126)
Anion gap: 11 (ref 5–15)
BUN: 20 mg/dL (ref 8–23)
CO2: 19 mmol/L — ABNORMAL LOW (ref 22–32)
Calcium: 8.4 mg/dL — ABNORMAL LOW (ref 8.9–10.3)
Chloride: 103 mmol/L (ref 98–111)
Creatinine, Ser: 1.07 mg/dL (ref 0.61–1.24)
GFR, Estimated: >60 mL/min
Glucose, Bld: 143 mg/dL — ABNORMAL HIGH (ref 70–99)
Potassium: 2.5 mmol/L — CL (ref 3.5–5.1)
Sodium: 133 mmol/L — ABNORMAL LOW (ref 135–145)
Total Bilirubin: 1.5 mg/dL — ABNORMAL HIGH (ref 0.3–1.2)
Total Protein: 7.5 g/dL (ref 6.5–8.1)

## 2023-01-09 LAB — CBC WITH DIFFERENTIAL/PLATELET
Abs Immature Granulocytes: 0.08 K/uL — ABNORMAL HIGH (ref 0.00–0.07)
Basophils Absolute: 0 K/uL (ref 0.0–0.1)
Basophils Relative: 0 %
Eosinophils Absolute: 0 K/uL (ref 0.0–0.5)
Eosinophils Relative: 0 %
HCT: 39.6 % (ref 39.0–52.0)
Hemoglobin: 12.7 g/dL — ABNORMAL LOW (ref 13.0–17.0)
Immature Granulocytes: 1 %
Lymphocytes Relative: 8 %
Lymphs Abs: 1 K/uL (ref 0.7–4.0)
MCH: 26 pg (ref 26.0–34.0)
MCHC: 32.1 g/dL (ref 30.0–36.0)
MCV: 81.1 fL (ref 80.0–100.0)
Monocytes Absolute: 0.8 K/uL (ref 0.1–1.0)
Monocytes Relative: 6 %
Neutro Abs: 11.8 K/uL — ABNORMAL HIGH (ref 1.7–7.7)
Neutrophils Relative %: 85 %
Platelets: 235 K/uL (ref 150–400)
RBC: 4.88 MIL/uL (ref 4.22–5.81)
RDW: 14.8 % (ref 11.5–15.5)
WBC: 13.8 K/uL — ABNORMAL HIGH (ref 4.0–10.5)
nRBC: 0 % (ref 0.0–0.2)

## 2023-01-09 LAB — URINALYSIS, COMPLETE (UACMP) WITH MICROSCOPIC
Bilirubin Urine: NEGATIVE
Glucose, UA: NEGATIVE mg/dL
Ketones, ur: NEGATIVE mg/dL
Nitrite: NEGATIVE
Protein, ur: 100 mg/dL — AB
RBC / HPF: >50 RBC/hpf (ref 0–5)
Specific Gravity, Urine: 1.017 (ref 1.005–1.030)
WBC, UA: >50 WBC/hpf (ref 0–5)
pH: 5 (ref 5.0–8.0)

## 2023-01-09 LAB — EXPECTORATED SPUTUM ASSESSMENT W GRAM STAIN, RFLX TO RESP C

## 2023-01-09 LAB — PROTIME-INR
INR: 1.3 — ABNORMAL HIGH (ref 0.8–1.2)
Prothrombin Time: 16.3 seconds — ABNORMAL HIGH (ref 11.4–15.2)

## 2023-01-09 LAB — LACTIC ACID, PLASMA
Lactic Acid, Venous: 1.2 mmol/L (ref 0.5–1.9)
Lactic Acid, Venous: 3.1 mmol/L (ref 0.5–1.9)
Lactic Acid, Venous: 2.4 mmol/L (ref 0.5–1.9)
Lactic Acid, Venous: 2.6 mmol/L (ref 0.5–1.9)
Lactic Acid, Venous: 1.4 mmol/L (ref 0.5–1.9)
Lactic Acid, Venous: 1 mmol/L (ref 0.5–1.9)

## 2023-01-09 LAB — SARS CORONAVIRUS 2 BY RT PCR: SARS Coronavirus 2 by RT PCR: NEGATIVE

## 2023-01-09 LAB — APTT: aPTT: 33 seconds (ref 24–36)

## 2023-01-09 LAB — PROCALCITONIN: Procalcitonin: <0.1 ng/mL

## 2023-01-09 LAB — MAGNESIUM: Magnesium: 1.5 mg/dL — ABNORMAL LOW (ref 1.7–2.4)

## 2023-01-09 LAB — PHOSPHORUS: Phosphorus: 1.3 mg/dL — ABNORMAL LOW (ref 2.5–4.6)

## 2023-01-09 LAB — CULTURE, BLOOD (ROUTINE X 2): Culture: NO GROWTH

## 2023-01-09 MED ORDER — NICOTINE 21 MG/24HR TD PT24
21.0000 mg | MEDICATED_PATCH | Freq: Every day | TRANSDERMAL | Status: DC
  Filled 2023-01-09 (×2): qty 1

## 2023-01-09 MED ORDER — SODIUM CHLORIDE 0.9 % IV SOLN
2.0000 g | INTRAVENOUS | Status: DC
  Administered 2023-01-10 – 2023-01-12 (×3): 2 g via INTRAVENOUS
  Filled 2023-01-09 (×3): qty 20

## 2023-01-09 MED ORDER — MAGNESIUM SULFATE 2 GM/50ML IV SOLN
2.0000 g | Freq: Once | INTRAVENOUS | Status: AC
  Administered 2023-01-09: 2 g via INTRAVENOUS
  Filled 2023-01-09: qty 50

## 2023-01-09 MED ORDER — LACTATED RINGERS IV SOLN
150.0000 mL/h | INTRAVENOUS | Status: DC
  Administered 2023-01-09: 150 mL/h via INTRAVENOUS

## 2023-01-09 MED ORDER — TRAZODONE HCL 50 MG PO TABS
25.0000 mg | ORAL_TABLET | Freq: Every evening | ORAL | Status: DC | PRN
  Filled 2023-01-09: qty 1

## 2023-01-09 MED ORDER — ACETAMINOPHEN 500 MG PO TABS: 1000.0000 mg | ORAL_TABLET | Freq: Once | ORAL | Status: AC

## 2023-01-09 MED ORDER — LACTATED RINGERS IV BOLUS
1500.0000 mL | Freq: Once | INTRAVENOUS | Status: AC
  Administered 2023-01-09: 1500 mL via INTRAVENOUS

## 2023-01-09 MED ORDER — POTASSIUM CHLORIDE CRYS ER 20 MEQ PO TBCR
40.0000 meq | EXTENDED_RELEASE_TABLET | Freq: Once | ORAL | Status: AC
  Administered 2023-01-09: 40 meq via ORAL
  Filled 2023-01-09: qty 2

## 2023-01-09 MED ORDER — LACOSAMIDE 50 MG PO TABS
200.0000 mg | ORAL_TABLET | Freq: Two times a day (BID) | ORAL | Status: DC
  Administered 2023-01-09 – 2023-01-12 (×7): 200 mg via ORAL
  Filled 2023-01-09 (×7): qty 4

## 2023-01-09 MED ORDER — POLYETHYLENE GLYCOL 3350 17 G PO PACK: 17.0000 g | PACK | Freq: Every day | ORAL | Status: DC | PRN

## 2023-01-09 MED ORDER — POTASSIUM PHOSPHATES 15 MMOLE/5ML IV SOLN
30.0000 mmol | Freq: Once | INTRAVENOUS | Status: AC
  Administered 2023-01-09: 30 mmol via INTRAVENOUS
  Filled 2023-01-09: qty 10

## 2023-01-09 MED ORDER — SODIUM CHLORIDE 0.9 % IV SOLN
500.0000 mg | Freq: Once | INTRAVENOUS | Status: AC
  Administered 2023-01-09: 500 mg via INTRAVENOUS
  Filled 2023-01-09: qty 5

## 2023-01-09 MED ORDER — MELATONIN 5 MG PO TABS
2.5000 mg | ORAL_TABLET | Freq: Every day | ORAL | Status: DC
  Administered 2023-01-09 – 2023-01-11 (×3): 2.5 mg via ORAL
  Filled 2023-01-09 (×3): qty 1

## 2023-01-09 MED ORDER — SENNA 8.6 MG PO TABS
2.0000 | ORAL_TABLET | Freq: Every day | ORAL | Status: DC
  Administered 2023-01-11: 17.2 mg via ORAL
  Filled 2023-01-09 (×2): qty 2

## 2023-01-09 MED ORDER — DICLOFENAC SODIUM 1 % EX GEL: 2.0000 g | Freq: Four times a day (QID) | CUTANEOUS | Status: DC | PRN

## 2023-01-09 MED ORDER — AMLODIPINE BESYLATE 10 MG PO TABS: 10.0000 mg | ORAL_TABLET | Freq: Every day | ORAL | Status: DC

## 2023-01-09 MED ORDER — SODIUM CHLORIDE 0.9 % IV SOLN
2.0000 g | Freq: Once | INTRAVENOUS | Status: AC
  Administered 2023-01-09: 2 g via INTRAVENOUS
  Filled 2023-01-09: qty 20

## 2023-01-09 MED ORDER — DM-GUAIFENESIN ER 30-600 MG PO TB12
1.0000 | ORAL_TABLET | Freq: Two times a day (BID) | ORAL | Status: DC | PRN
  Administered 2023-01-09 – 2023-01-10 (×2): 1 via ORAL
  Filled 2023-01-09 (×2): qty 1

## 2023-01-09 MED ORDER — ACETAMINOPHEN 325 MG RE SUPP: 650.0000 mg | Freq: Four times a day (QID) | RECTAL | Status: DC | PRN

## 2023-01-09 MED ORDER — ONDANSETRON HCL 4 MG/2ML IJ SOLN: 4.0000 mg | Freq: Four times a day (QID) | INTRAMUSCULAR | Status: DC | PRN

## 2023-01-09 MED ORDER — LACTATED RINGERS IV BOLUS
500.0000 mL | Freq: Once | INTRAVENOUS | Status: AC
  Administered 2023-01-09: 500 mL via INTRAVENOUS

## 2023-01-09 MED ORDER — MAGNESIUM HYDROXIDE 400 MG/5ML PO SUSP: 30.0000 mL | Freq: Every day | ORAL | Status: DC | PRN

## 2023-01-09 MED ORDER — LACTATED RINGERS IV BOLUS
1000.0000 mL | Freq: Once | INTRAVENOUS | Status: AC
  Administered 2023-01-09: 1000 mL via INTRAVENOUS

## 2023-01-09 MED ORDER — ASPIRIN 81 MG PO TBEC
81.0000 mg | DELAYED_RELEASE_TABLET | Freq: Every day | ORAL | Status: DC
  Administered 2023-01-09 – 2023-01-12 (×4): 81 mg via ORAL
  Filled 2023-01-09 (×4): qty 1

## 2023-01-09 MED ORDER — HYDRALAZINE HCL 20 MG/ML IJ SOLN: 5.0000 mg | INTRAMUSCULAR | Status: DC | PRN

## 2023-01-09 MED ORDER — LACTATED RINGERS IV SOLN: INTRAVENOUS | Status: DC

## 2023-01-09 MED ORDER — ONDANSETRON HCL 4 MG PO TABS: 4.0000 mg | ORAL_TABLET | Freq: Four times a day (QID) | ORAL | Status: DC | PRN

## 2023-01-09 MED ORDER — SODIUM CHLORIDE 0.9 % IV BOLUS: 1.5000 mL | Freq: Once | INTRAVENOUS | Status: DC

## 2023-01-09 MED ORDER — LORAZEPAM 2 MG/ML IJ SOLN: 2.0000 mg | INTRAMUSCULAR | Status: DC | PRN

## 2023-01-09 MED ORDER — ENOXAPARIN SODIUM 60 MG/0.6ML IJ SOSY
50.0000 mg | PREFILLED_SYRINGE | INTRAMUSCULAR | Status: DC
  Administered 2023-01-09 – 2023-01-12 (×4): 50 mg via SUBCUTANEOUS
  Filled 2023-01-09 (×4): qty 0.6

## 2023-01-09 MED ORDER — ALBUTEROL SULFATE (2.5 MG/3ML) 0.083% IN NEBU: 2.5000 mg | INHALATION_SOLUTION | RESPIRATORY_TRACT | Status: DC | PRN

## 2023-01-09 MED ORDER — ACETAMINOPHEN 325 MG PO TABS: 650.0000 mg | ORAL_TABLET | Freq: Four times a day (QID) | ORAL | Status: DC | PRN

## 2023-01-09 MED ORDER — SODIUM CHLORIDE 0.9 % IV SOLN
500.0000 mg | INTRAVENOUS | Status: DC
  Administered 2023-01-10: 500 mg via INTRAVENOUS
  Filled 2023-01-09: qty 5

## 2023-01-09 MED ORDER — SODIUM CHLORIDE 0.9 % IV SOLN: INTRAVENOUS | Status: DC

## 2023-01-09 MED ORDER — ATORVASTATIN CALCIUM 20 MG PO TABS
80.0000 mg | ORAL_TABLET | Freq: Every day | ORAL | Status: DC
  Administered 2023-01-09 – 2023-01-12 (×4): 80 mg via ORAL
  Filled 2023-01-09 (×4): qty 4

## 2023-01-09 MED ORDER — PANTOPRAZOLE SODIUM 40 MG PO TBEC
40.0000 mg | DELAYED_RELEASE_TABLET | Freq: Every day | ORAL | Status: DC | PRN
  Administered 2023-01-11: 40 mg via ORAL
  Filled 2023-01-09: qty 1

## 2023-01-09 NOTE — ED Provider Notes (Signed)
[facility] Provider Note    Event Date/Time   First MD Initiated Contact with Patient 01/09/23 0139     (approximate)   History   Code Sepsis   HPI  Mark Durham is a 72 y.o. male who presents to the ED for evaluation of Code Sepsis   I reviewed medical DC summary from 3 days ago.  Patient was admitted overnight due to slurred speech and possible TIA.  Negative MRI.  He is a history of left MCA stroke, complicated by Po stroke seizures.  Residual right-sided weakness.  Lives at home with his wife.  Otherwise history of HTN, HLD, Vimpat for the epilepsy  Patient presents for evaluation of fever in the past few hours.  Here in the ED, he reports feeling fine and has no complaints and no needs.   Physical Exam   Triage Vital Signs: ED Triage Vitals  Enc Vitals Group     BP 01/09/23 0144 131/80     Pulse Rate 01/09/23 0144 (!) 122     Resp 01/09/23 0144 17     Temp 01/09/23 0144 (!) 102 F (38.9 C)     Temp Source 01/09/23 0144 Oral     SpO2 01/09/23 0144 97 %     Weight --      Height --      Head Circumference --      Peak Flow --      Pain Score 01/09/23 0145 0     Pain Loc --      Pain Edu? --      Excl. in GC? --     Most recent vital signs: Vitals:   01/09/23 0500 01/09/23 0551  BP: 99/61 112/72  Pulse: 99 (!) 103  Resp: 18 20  Temp:  99.2 F (37.3 C)  SpO2: 93% 95%    General: Awake, no distress.  Warm to the touch, pleasant and conversational. CV:  Good peripheral perfusion.  Resp:  Normal effort.  No distress or wheezing.  No hypoxia Abd:  No distention.  Soft and benign MSK:  No deformity noted.  Neuro:  No focal deficits appreciated. Other:     ED Results / Procedures / Treatments   Labs (all labs ordered are listed, but only abnormal results are displayed) Labs Reviewed  LACTIC ACID, PLASMA - Abnormal; Notable for the following components:      Result Value   Lactic Acid, Venous 3.1 (*)    All other  components within normal limits  COMPREHENSIVE METABOLIC PANEL - Abnormal; Notable for the following components:   Sodium 133 (*)    Potassium 2.5 (*)    CO2 19 (*)    Glucose, Bld 143 (*)    Calcium 8.4 (*)    Total Bilirubin 1.5 (*)    All other components within normal limits  CBC WITH DIFFERENTIAL/PLATELET - Abnormal; Notable for the following components:   WBC 13.8 (*)    Hemoglobin 12.7 (*)    Neutro Abs 11.8 (*)    Abs Immature Granulocytes 0.08 (*)    All other components within normal limits  PROTIME-INR - Abnormal; Notable for the following components:   Prothrombin Time 16.3 (*)    INR 1.3 (*)    All other components within normal limits  URINALYSIS, COMPLETE (UACMP) WITH MICROSCOPIC - Abnormal; Notable for the following components:   Color, Urine YELLOW (*)    APPearance CLOUDY (*)    Hgb urine dipstick MODERATE (*)  Protein, ur 100 (*)    Leukocytes,Ua LARGE (*)    Bacteria, UA MANY (*)    All other components within normal limits  CULTURE, BLOOD (ROUTINE X 2)  SARS CORONAVIRUS 2 BY RT PCR  CULTURE, BLOOD (ROUTINE X 2)  URINE CULTURE  LACTIC ACID, PLASMA  APTT  PROCALCITONIN    EKG Sinus tachycardia with a rate of 120 bpm.  Normal axis and intervals.  Nonspecific ST changes without STEMI.  RADIOLOGY 1 view CXR interpreted by me with possible basilar left infiltrate  Official radiology report(s): DG Chest Port 1 View  Result Date: 01/09/2023 CLINICAL DATA:  Sepsis EXAM: PORTABLE CHEST 1 VIEW COMPARISON:  01/06/2023 FINDINGS: Stable mild left basilar atelectasis or infiltrate. Lung volumes are small, but are symmetric. No pneumothorax or pleural effusion. Cardiac size within normal limits. Pulmonary vascularity is normal. No acute bone abnormality. IMPRESSION: 1. Pulmonary hypoinflation. 2. Stable mild left basilar atelectasis or infiltrate. Electronically Signed   By: Helyn Numbers M.D.   On: 01/09/2023 02:22    PROCEDURES and INTERVENTIONS:  .1-3 Lead  EKG Interpretation  Performed by: Delton Prairie, MD Authorized by: Delton Prairie, MD     Interpretation: abnormal     ECG rate:  108   ECG rate assessment: tachycardic     Rhythm: sinus tachycardia     Ectopy: none     Conduction: normal   .Critical Care  Performed by: Delton Prairie, MD Authorized by: Delton Prairie, MD   Critical care provider statement:    Critical care time (minutes):  30   Critical care time was exclusive of:  Separately billable procedures and treating other patients   Critical care was necessary to treat or prevent imminent or life-threatening deterioration of the following conditions:  Sepsis   Critical care was time spent personally by me on the following activities:  Development of treatment plan with patient or surrogate, discussions with consultants, evaluation of patient's response to treatment, examination of patient, ordering and review of laboratory studies, ordering and review of radiographic studies, ordering and performing treatments and interventions, pulse oximetry, re-evaluation of patient's condition and review of old charts   Medications  aspirin EC tablet 81 mg (has no administration in time range)  amLODipine (NORVASC) tablet 10 mg (has no administration in time range)  atorvastatin (LIPITOR) tablet 80 mg (has no administration in time range)  pantoprazole (PROTONIX) EC tablet 40 mg (has no administration in time range)  polyethylene glycol (MIRALAX / GLYCOLAX) packet 17 g (has no administration in time range)  senna (SENOKOT) tablet 17.2 mg (has no administration in time range)  melatonin tablet 2.5 mg (has no administration in time range)  lacosamide (VIMPAT) tablet 200 mg (has no administration in time range)  diclofenac Sodium (VOLTAREN) 1 % topical gel 2 g (has no administration in time range)  lactated ringers infusion ( Intravenous Infusion Verify 01/09/23 0635)  enoxaparin (LOVENOX) injection 50 mg (has no administration in time range)   cefTRIAXone (ROCEPHIN) 2 g in sodium chloride 0.9 % 100 mL IVPB (has no administration in time range)  acetaminophen (TYLENOL) tablet 650 mg (has no administration in time range)    Or  acetaminophen (TYLENOL) suppository 650 mg (has no administration in time range)  traZODone (DESYREL) tablet 25 mg (has no administration in time range)  magnesium hydroxide (MILK OF MAGNESIA) suspension 30 mL (has no administration in time range)  ondansetron (ZOFRAN) tablet 4 mg (has no administration in time range)    Or  ondansetron (ZOFRAN) injection 4 mg (has no administration in time range)  acetaminophen (TYLENOL) tablet 1,000 mg (1,000 mg Oral Given by EMS 01/09/23 0218)  lactated ringers bolus 1,000 mL (0 mLs Intravenous Stopped 01/09/23 0329)  potassium chloride SA (KLOR-CON M) CR tablet 40 mEq (40 mEq Oral Given 01/09/23 0330)  cefTRIAXone (ROCEPHIN) 2 g in sodium chloride 0.9 % 100 mL IVPB (0 g Intravenous Stopped 01/09/23 0400)  azithromycin (ZITHROMAX) 500 mg in sodium chloride 0.9 % 250 mL IVPB (0 mg Intravenous Stopped 01/09/23 0500)     IMPRESSION / MDM / ASSESSMENT AND PLAN / ED COURSE  I reviewed the triage vital signs and the nursing notes.  Differential diagnosis includes, but is not limited to, COVID or other viral syndrome, sepsis, pneumonia, UTI  {Patient presents with symptoms of an acute illness or injury that is potentially life-threatening.  Patient presents with evidence of urosepsis requiring medical admission.  He is on azithromycin for possible pneumonia but urine is dirty and he is meeting sepsis criteria.  Hypokalemia and restart replacement orally.  Consult with medicine for admission for sepsis.  Clinical Course as of 01/09/23 0732  Sun Jan 09, 2023  0347 Reassessed. Wife at bedside provides history [DS]  0418 I consult with medicine who agrees to admit [DS]    Clinical Course User Index [DS] Delton Prairie, MD     FINAL CLINICAL IMPRESSION(S) / ED DIAGNOSES    Final diagnoses:  Sepsis without acute organ dysfunction, due to unspecified organism [facility])  Urinary tract infection without hematuria, site unspecified     Rx / DC Orders   ED Discharge Orders     None        Note:  This document was prepared using Dragon voice recognition software and may include unintentional dictation errors.   Delton Prairie, MD 01/09/23 [phone]

## 2023-01-09 NOTE — Progress Notes (Signed)
PHARMACIST - PHYSICIAN COMMUNICATION  CONCERNING:  Enoxaparin (Lovenox) for DVT Prophylaxis    RECOMMENDATION: Patient was prescribed enoxaprin 40mg  q24 hours for VTE prophylaxis.   Filed Weights   01/09/23 0236  Weight: 100.7 kg (222 lb)    Body mass index is 31.85 kg/m.  Estimated Creatinine Clearance: 74.2 mL/min (by C-G formula based on SCr of 1.07 mg/dL).   Based on [facility] policy patient is candidate for enoxaparin 0.5mg /kg TBW SQ every 24 hours based on BMI being >30.  DESCRIPTION: Pharmacy has adjusted enoxaparin dose per [facility] policy.  Patient is now receiving enoxaparin 0.5 mg/kg every 24 hours   Otelia Sergeant, PharmD, [facility] 01/09/2023 5:43 AM

## 2023-01-09 NOTE — H&P (Addendum)
History and Physical    Mark Durham GNF:[id number] DOB: Aug 14, 1950 DOA: 01/09/2023  Referring MD/NP/PA:   PCP: [facility]   Patient coming from:  The patient is coming from home.     Chief Complaint: fever, increased urinary frequency  HPI: Mark Durham is a 72 y.o. male with medical history significant of stroke with mild right-sided weakness, hypertension, hyperlipidemia, GERD, obesity, poststroke epilepsy, who presents with fever, increased urinary frequency.  Patient was recently hospitalized from 6/26 - 6/27 due to TIA, and had a negative MRI.  Patient was discharged on azithromycin for possible pneumonia.  Since yesterday, patient developed fever and chills.  He reports increased urinary frequency, denies dysuria or burning or energy.  No hematuria.  Patient has cough with yellow-colored sputum production, denies chest pain or shortness breath.   Data reviewed independently and ED Course: pt was found to have WBC 13.8, lactic acid 1.2, 3.1, procalcitonin less than 0.10, positive urine analysis (cloudy appearance, large amount of leukocyte, many bacteria, WBC> 50), GFR> 60, potassium 2.5, magnesium of 1.5, phosphorus of 1.3, INR 1.3, PTT 33, negative COVID PCR.  Temperature 102, blood pressure 112/67, heart rate 122, RR 25, oxygen saturation 95% on room air.  Chest x-ray showed mild left basilar infiltration.  Patient is admitted to telemetry bed as inpatient.   EKG: I have personally reviewed.  Sinus rhythm, QTc 434, LAE, nonspecific T wave change.   Review of Systems:   General: no fevers, chills, no body weight gain, has fatigue HEENT: no blurry vision, hearing changes or sore throat Respiratory: no dyspnea, has coughing, no wheezing CV: no chest pain, no palpitations GI: no nausea, vomiting, abdominal pain, diarrhea, constipation GU: no dysuria, burning on urination, increased urinary frequency, hematuria  Ext: Has trace leg edema Neuro:  no vision  change or hearing loss.  Has mild right-sided weakness from previous stroke. Skin: no rash, no skin tear. MSK: No muscle spasm, no deformity, no limitation of range of movement in spin Heme: No easy bruising.  Travel history: No recent long distant travel.   Allergy: No Known Allergies  Past Medical History:  Diagnosis Date   Hypertension     Past Surgical History:  Procedure Laterality Date   TOTAL HIP ARTHROPLASTY Right     Social History:  reports that he has been smoking. He has been exposed to tobacco smoke. He has never used smokeless tobacco. He reports that he does not drink alcohol and does not use drugs.  Family History:  Family History  Problem Relation Age of Onset   Hypertension Mother      Prior to Admission medications   Medication Sig Start Date End Date Taking? Authorizing Provider  acetaminophen (TYLENOL) 500 MG tablet Take 1,000 mg by mouth every 6 (six) hours as needed for moderate pain (as needed for pain). Take 2 tablets 3x per day as needed for pain   Yes [provider]  amLODipine (NORVASC) 10 MG tablet Take 10 mg by mouth daily.   Yes [provider]  aspirin EC 81 MG tablet Take 81 mg by mouth daily. Swallow whole.   Yes [provider]  atorvastatin (LIPITOR) 80 MG tablet Take 80 mg by mouth daily. Take 1 tablet by mouth daily   Yes [provider]  azithromycin (ZITHROMAX) 500 MG tablet Take 1 tablet (500 mg total) by mouth daily for 3 days. 01/06/23 01/09/23 Yes Lanae Boast, MD  diclofenac Sodium (VOLTAREN) 1 % GEL Apply  2 g topically in the morning, at noon, and at bedtime. Apply 2g topically 3x daily as needed for arthritis left hip   Yes [provider]  Lacosamide 100 MG TABS Take 200 mg by mouth 2 (two) times daily.   Yes [provider]  melatonin 3 MG TABS tablet Take 3 mg by mouth at bedtime.   Yes [provider]  omeprazole (PRILOSEC OTC) 20 MG tablet Take 20 mg by mouth daily.  Take 1 tablet by mouth daily as needed.   Yes [provider]  polyethylene glycol (MIRALAX / GLYCOLAX) 17 g packet Take 17 g by mouth daily. Drink by mouth daily as needed.   Yes [provider]  senna (SENOKOT) 8.6 MG TABS tablet Take 2 tablets by mouth at bedtime. Take 2 tablets by mouth nightly.   Yes [provider]  tadalafil (CIALIS) 20 MG tablet Take 1 tablet (20 mg total) by mouth daily as needed for erectile dysfunction (1 hour prior to sexual activity). Patient not taking: Reported on 11/15/2022 11/18/21   Sondra Come, MD    Physical Exam: Vitals:   01/09/23 0400 01/09/23 0500 01/09/23 0551 01/09/23 0826  BP: 101/70 99/61 112/72 112/67  Pulse: (!) 106 99 (!) 103 (!) 102  Resp: 18 18 20 16   Temp:   99.2 F (37.3 C) 99.3 F (37.4 C)  TempSrc:   Oral   SpO2: 100% 93% 95% 98%  Weight:      Height:       General: Not in acute distress HEENT:       Eyes: PERRL, EOMI, no jaundice       ENT: No discharge from the ears and nose, no pharynx injection, no tonsillar enlargement.        Neck: No JVD, no bruit, no mass felt. Heme: No neck lymph node enlargement. Cardiac: S1/S2, RRR, No murmurs, No gallops or rubs. Respiratory: No rales, wheezing, rhonchi or rubs. GI: Soft, nondistended, nontender, no rebound pain, no organomegaly, BS present. GU: No hematuria Ext: Has trace leg edema bilaterally. 1+DP/PT pulse bilaterally. Musculoskeletal: No joint deformities, No joint redness or warmth, no limitation of ROM in spin. Skin: No rashes.  Neuro: Alert, oriented X3, cranial nerves II-XII grossly intact, has mild right-sided weakness Psych: Patient is not psychotic, no suicidal or hemocidal ideation.  Labs on Admission: I have personally reviewed following labs and imaging studies  CBC: Recent Labs  Lab 01/05/23 2005 01/06/23 0519 01/09/23 0209  WBC 7.9 7.6 13.8*  NEUTROABS 4.2  --  11.8*  HGB 14.6 13.8 12.7*  HCT 46.6 43.6 39.6  MCV 81.8 81.3  81.1  PLT 153 244 235   Basic Metabolic Panel: Recent Labs  Lab 01/05/23 2005 01/06/23 0519 01/09/23 0209  NA 137 137 133*  K 3.3* 3.3* 2.5*  CL 104 106 103  CO2 23 24 19*  GLUCOSE 125* 111* 143*  BUN 16 13 20   CREATININE 0.99 0.78 1.07  CALCIUM 9.4 9.3 8.4*  MG  --   --  1.5*  PHOS  --   --  1.3*   GFR: Estimated Creatinine Clearance: 74.2 mL/min (by C-G formula based on SCr of 1.07 mg/dL). Liver Function Tests: Recent Labs  Lab 01/05/23 2005 01/09/23 0209  AST 17 16  ALT 18 13  ALKPHOS 129* 86  BILITOT 0.9 1.5*  PROT 8.8* 7.5  ALBUMIN 4.4 3.6   No results for input(s): "LIPASE", "AMYLASE" in the last 168 hours. No results for  input(s): "AMMONIA" in the last 168 hours. Coagulation Profile: Recent Labs  Lab 01/05/23 2005 01/09/23 0209  INR 1.1 1.3*   Cardiac Enzymes: No results for input(s): "CKTOTAL", "CKMB", "CKMBINDEX", "TROPONINI" in the last 168 hours. BNP (last 3 results) No results for input(s): "PROBNP" in the last 8760 hours. HbA1C: No results for input(s): "HGBA1C" in the last 72 hours. CBG: Recent Labs  Lab 01/05/23 1952  GLUCAP 134*   Lipid Profile: No results for input(s): "CHOL", "HDL", "LDLCALC", "TRIG", "CHOLHDL", "LDLDIRECT" in the last 72 hours. Thyroid Function Tests: No results for input(s): "TSH", "T4TOTAL", "FREET4", "T3FREE", "THYROIDAB" in the last 72 hours. Anemia Panel: No results for input(s): "VITAMINB12", "FOLATE", "FERRITIN", "TIBC", "IRON", "RETICCTPCT" in the last 72 hours. Urine analysis:    Component Value Date/Time   COLORURINE YELLOW (A) 01/09/2023 0335   APPEARANCEUR CLOUDY (A) 01/09/2023 0335   APPEARANCEUR Clear 04/15/2021 0908   LABSPEC 1.017 01/09/2023 0335   PHURINE 5.0 01/09/2023 0335   GLUCOSEU NEGATIVE 01/09/2023 0335   HGBUR MODERATE (A) 01/09/2023 0335   BILIRUBINUR NEGATIVE 01/09/2023 0335   BILIRUBINUR Negative 04/15/2021 0908   KETONESUR NEGATIVE 01/09/2023 0335   PROTEINUR 100 (A) 01/09/2023  0335   NITRITE NEGATIVE 01/09/2023 0335   LEUKOCYTESUR LARGE (A) 01/09/2023 0335   Sepsis Labs: @LABRCNTIP (procalcitonin:4,lacticidven:4) ) Recent Results (from the past 240 hour(s))  Blood Culture (routine x 2)     Status: None (Preliminary result)   Collection Time: 01/09/23  2:09 AM   Specimen: BLOOD LEFT ARM  Result Value Ref Range Status   Specimen Description BLOOD LEFT ARM  Final   Special Requests   Final    BOTTLES DRAWN AEROBIC AND ANAEROBIC Blood Culture results may not be optimal due to an inadequate volume of blood received in culture bottles   Culture   Final    NO GROWTH < 12 HOURS Performed at [facility], [address], [place], [place] [address]    Report Status PENDING  Incomplete  SARS Coronavirus 2 by RT PCR (hospital order, performed in [facility] hospital lab) *cepheid single result test* Anterior Nasal Swab     Status: None   Collection Time: 01/09/23  2:11 AM   Specimen: Anterior Nasal Swab  Result Value Ref Range Status   SARS Coronavirus 2 by RT PCR NEGATIVE NEGATIVE Final    Comment: (NOTE) SARS-CoV-2 target nucleic acids are NOT DETECTED.  The SARS-CoV-2 RNA is generally detectable in upper and lower respiratory specimens during the acute phase of infection. The lowest concentration of SARS-CoV-2 viral copies this assay can detect is 250 copies / mL. A negative result does not preclude SARS-CoV-2 infection and should not be used as the sole basis for treatment or other patient management decisions.  A negative result may occur with improper specimen collection / handling, submission of specimen other than nasopharyngeal swab, presence of viral mutation(s) within the areas targeted by this assay, and inadequate number of viral copies (<250 copies / mL). A negative result must be combined with clinical observations, patient history, and epidemiological information.  Fact Sheet for Patients:    RoadLapTop.co.za  Fact Sheet for Healthcare Providers: http://kim-miller.com/  This test is not yet approved or  cleared by the [place] FDA and has been authorized for detection and/or diagnosis of SARS-CoV-2 by FDA under an Emergency Use Authorization (EUA).  This EUA will remain in effect (meaning this test can be used) for the duration of the COVID-19 declaration under Section 564(b)(1) of the Act,  21 U.S.C. section 360bbb-3(b)(1), unless the authorization is terminated or revoked sooner.  Performed at [facility], [address], [place], [place] [address]      Radiological Exams on Admission: DG [facility] 1 View  Result Date: 01/09/2023 CLINICAL DATA:  Sepsis EXAM: PORTABLE CHEST 1 VIEW COMPARISON:  01/06/2023 FINDINGS: Stable mild left basilar atelectasis or infiltrate. Lung volumes are small, but are symmetric. No pneumothorax or pleural effusion. Cardiac size within normal limits. Pulmonary vascularity is normal. No acute bone abnormality. IMPRESSION: 1. Pulmonary hypoinflation. 2. Stable mild left basilar atelectasis or infiltrate. Electronically Signed   By: Helyn Numbers M.D.   On: 01/09/2023 02:22      Assessment/Plan Principal Problem:   Severe sepsis (HCC) Active Problems:   UTI (urinary tract infection)   CAP (community acquired pneumonia)   Stroke (HCC)   HTN (hypertension)   HLD (hyperlipidemia)   Hypokalemia   Hypomagnesemia   Hypophosphatemia   Tobacco use   Obesity (BMI 30-39.9)   Assessment and Plan:  Severe sepsis due to UTI (urinary tract infection): pt meets criteria for severe sepsis with WBC 13.8, heart rate up to 122, RR up to 25, fever of 102, lactic acid 1.2 --> 3.1.  Procalcitonin less than 0.10.  Currently hemodynamically stable  -Admit to telemetry bed as inpatient -IV Rocephin -Follow-up blood culture urine culture -IV fluid: total of 3.0 L of LR bolus, then 75 cc/h -->  will give more IVF as needed  -Trend lactic acid level  Possible CAP (community acquired pneumonia): Chest x-ray showed stable mild left basilar infiltration, patient reports productive cough, but no chest pain or shortness of breath. -IV Rocephin plus IV azithromycin - Mucinex for cough  - Bronchodilators - Urine legionella and S. pneumococcal antigen - Follow up blood culture x2, sputum culture  Stroke [facility]): has mild right-sided weakness. -Aspirin, Lipitor -Fall precaution  HTN (hypertension) -Hold amlodipine since patient is at risk of developing hypotension due to severe sepsis -IV hydralazine as needed  HLD (hyperlipidemia) -Lipitor  Hypokalemia, Hypomagnesemia, Hypophosphatemia: Potassium 2.5, magnesium 1.5, phosphorus 1.3 -Repleted potassium, magnesium and phosphate  Tobacco use -Nicotine patch  Obesity (BMI 30-39.9): Body weight 100.7 kg, BMI 31.85 -Encourage losing weight, -Exercise and healthy diet      DVT ppx: SQ Lovenox  Code Status: Full code   Family Communication:   Yes, patient's wife  by phone  Disposition Plan:  Anticipate discharge back to previous environment  Consults called:  none  Admission status and Level of care: Telemetry Medical:   as inpt      Dispo: The patient is from: Home              Anticipated d/c is to: Home              Anticipated d/c date is: 2 days              Patient currently is not medically stable to d/c.    Severity of Illness:  The appropriate patient status for this patient is INPATIENT. Inpatient status is judged to be reasonable and necessary in order to provide the required intensity of service to ensure the patient's safety. The patient's presenting symptoms, physical exam findings, and initial radiographic and laboratory data in the context of their chronic comorbidities is felt to place them at high risk for further clinical deterioration. Furthermore, it is not anticipated that the patient will be  medically stable for discharge from the hospital within 2 midnights  of admission.   * I certify that at the point of admission it is my clinical judgment that the patient will require inpatient hospital care spanning beyond 2 midnights from the point of admission due to high intensity of service, high risk for further deterioration and high frequency of surveillance required.*       Date of Service 01/09/2023    Lorretta Harp [facility]   If 7PM-7AM, please contact night-coverage [email or web address] 01/09/2023, 11:29 AM

## 2023-01-09 NOTE — ED Triage Notes (Signed)
Pt to ed BY ems from home c/o fever, chills, malaise onset of 2 hours ago. EMS originally called for a possible stroke, was seen 6/26 for the same. Negative stroke screen. Pt reports fatigue, has right sided weakness due to previous stroke.Arrives with elevated HR, temp of 102, all other VSS, NADN.

## 2023-01-09 NOTE — Plan of Care (Signed)
  Problem: Coping: Goal: Will verbalize positive feelings about self Outcome: Progressing Goal: Will identify appropriate support needs Outcome: Progressing   Problem: Health Behavior/Discharge Planning: Goal: Ability to manage health-related needs will improve Outcome: Progressing Goal: Goals will be collaboratively established with patient/family Outcome: Progressing   Problem: Self-Care: Goal: Ability to participate in self-care as condition permits will improve Outcome: Progressing Goal: Verbalization of feelings and concerns over difficulty with self-care will improve Outcome: Progressing Goal: Ability to communicate needs accurately will improve Outcome: Progressing   Problem: Nutrition: Goal: Risk of aspiration will decrease Outcome: Progressing Goal: Dietary intake will improve Outcome: Progressing   Problem: Fluid Volume: Goal: Hemodynamic stability will improve Outcome: Progressing   Problem: Clinical Measurements: Goal: Diagnostic test results will improve Outcome: Progressing Goal: Signs and symptoms of infection will decrease Outcome: Progressing   Problem: Respiratory: Goal: Ability to maintain adequate ventilation will improve Outcome: Progressing   Problem: Education: Goal: Knowledge of General Education information will improve Description: Including pain rating scale, medication(s)/side effects and non-pharmacologic comfort measures Outcome: Progressing   Problem: Health Behavior/Discharge Planning: Goal: Ability to manage health-related needs will improve Outcome: Progressing   Problem: Clinical Measurements: Goal: Ability to maintain clinical measurements within normal limits will improve Outcome: Progressing Goal: Will remain free from infection Outcome: Progressing Goal: Diagnostic test results will improve Outcome: Progressing Goal: Respiratory complications will improve Outcome: Progressing Goal: Cardiovascular complication will be  avoided Outcome: Progressing   Problem: Activity: Goal: Risk for activity intolerance will decrease Outcome: Progressing   Problem: Nutrition: Goal: Adequate nutrition will be maintained Outcome: Progressing   Problem: Coping: Goal: Level of anxiety will decrease Outcome: Progressing   Problem: Elimination: Goal: Will not experience complications related to bowel motility Outcome: Progressing Goal: Will not experience complications related to urinary retention Outcome: Progressing   Problem: Pain Managment: Goal: General experience of comfort will improve Outcome: Progressing   Problem: Safety: Goal: Ability to remain free from injury will improve Outcome: Progressing   Problem: Skin Integrity: Goal: Risk for impaired skin integrity will decrease Outcome: Progressing

## 2023-01-09 NOTE — ED Notes (Signed)
ED TO INPATIENT HANDOFF REPORT  ED Nurse Name and Phone #: Melburn Hake Name/Age/Gender Mark Durham 72 y.o. male Room/Bed: ED16A/ED16A  Code Status   Code Status: Prior  Home/SNF/Other Home Patient oriented to: situation Is this baseline? Yes   Triage Complete: Triage complete  Chief Complaint Sepsis due to pneumonia (HCC) [J18.9, A41.9]  Triage Note Pt to ed BY ems from home c/o fever, chills, malaise onset of 2 hours ago. EMS originally called for a possible stroke, was seen 6/26 for the same. Negative stroke screen. Pt reports fatigue, has right sided weakness due to previous stroke.Arrives with elevated HR, temp of 102, all other VSS, NADN.   Allergies No Known Allergies  Level of Care/Admitting Diagnosis ED Disposition     ED Disposition  Admit   Condition  --   Comment  Hospital Area: [facility] [[id number]]  Level of Care: Telemetry Medical [104]  Covid Evaluation: Asymptomatic - no recent exposure (last 10 days) testing not required  Diagnosis: Sepsis due to pneumonia [facility]) [[id number]]  Admitting Physician: Hannah Beat [[id number]]  Attending Physician: Hannah Beat [[id number]]  Certification:: I certify this patient will need inpatient services for at least 2 midnights  Estimated Length of Stay: 2          B Medical/Surgery History Past Medical History:  Diagnosis Date   Hypertension    Past Surgical History:  Procedure Laterality Date   TOTAL HIP ARTHROPLASTY Right      A IV Location/Drains/Wounds Patient Lines/Drains/Airways Status     Active Line/Drains/Airways     Name Placement date Placement time Site Days   Peripheral IV 01/09/23 20 G Left Forearm 01/09/23  0139  Forearm  less than 1   Peripheral IV 01/09/23 20 G Right Hand 01/09/23  0127  Hand  less than 1            Intake/Output Last 24 hours  Intake/Output Summary (Last 24 hours) at 01/09/2023 0454 Last data filed at 01/09/2023 0400 Gross per 24 hour   Intake 1100 ml  Output --  Net 1100 ml    Labs/Imaging Results for orders placed or performed during the hospital encounter of 01/09/23 (from the past 48 hour(s))  Lactic acid, plasma     Status: None   Collection Time: 01/09/23  2:09 AM  Result Value Ref Range   Lactic Acid, Venous 1.2 0.5 - 1.9 mmol/L    Comment: Performed at [facility], [address] [place], [place] [address]  Comprehensive metabolic panel     Status: Abnormal   Collection Time: 01/09/23  2:09 AM  Result Value Ref Range   Sodium 133 (L) 135 - 145 mmol/L   Potassium 2.5 (LL) 3.5 - 5.1 mmol/L    Comment: CRITICAL RESULT CALLED TO, READ BACK BY AND VERIFIED WITH Lorree Millar RN @ 0255 01/09/23 [facility]    Chloride 103 98 - 111 mmol/L   CO2 19 (L) 22 - 32 mmol/L   Glucose, Bld 143 (H) 70 - 99 mg/dL    Comment: Glucose reference range applies only to samples taken after fasting for at least 8 hours.   BUN 20 8 - 23 mg/dL   Creatinine, Ser 3.08 0.61 - 1.24 mg/dL   Calcium 8.4 (L) 8.9 - 10.3 mg/dL   Total Protein 7.5 6.5 - 8.1 g/dL   Albumin 3.6 3.5 - 5.0 g/dL   AST 16 15 - 41 U/L   ALT 13 0 -  44 U/L   Alkaline Phosphatase 86 38 - 126 U/L   Total Bilirubin 1.5 (H) 0.3 - 1.2 mg/dL   GFR, Estimated >04 >54 mL/min    Comment: (NOTE) Calculated using the CKD-EPI Creatinine Equation (2021)    Anion gap 11 5 - 15    Comment: Performed at [facility], [address] [place], [place] [address]  CBC with Differential     Status: Abnormal   Collection Time: 01/09/23  2:09 AM  Result Value Ref Range   WBC 13.8 (H) 4.0 - 10.5 K/uL   RBC 4.88 4.22 - 5.81 MIL/uL   Hemoglobin 12.7 (L) 13.0 - 17.0 g/dL   HCT 91.4 78.2 - 95.6 %   MCV 81.1 80.0 - 100.0 fL   MCH 26.0 26.0 - 34.0 pg   MCHC 32.1 30.0 - 36.0 g/dL   RDW 21.3 08.6 - 57.8 %   Platelets 235 150 - 400 K/uL   nRBC 0.0 0.0 - 0.2 %   Neutrophils Relative % 85 %   Neutro Abs 11.8 (H) 1.7 - 7.7 K/uL   Lymphocytes Relative 8 %   Lymphs Abs 1.0  0.7 - 4.0 K/uL   Monocytes Relative 6 %   Monocytes Absolute 0.8 0.1 - 1.0 K/uL   Eosinophils Relative 0 %   Eosinophils Absolute 0.0 0.0 - 0.5 K/uL   Basophils Relative 0 %   Basophils Absolute 0.0 0.0 - 0.1 K/uL   Immature Granulocytes 1 %   Abs Immature Granulocytes 0.08 (H) 0.00 - 0.07 K/uL    Comment: Performed at [facility], [address] [place], [place] [address]  Protime-INR     Status: Abnormal   Collection Time: 01/09/23  2:09 AM  Result Value Ref Range   Prothrombin Time 16.3 (H) 11.4 - 15.2 seconds   INR 1.3 (H) 0.8 - 1.2    Comment: (NOTE) INR goal varies based on device and disease states. Performed at [facility], [address] [place], [place] [address]   APTT     Status: None   Collection Time: 01/09/23  2:09 AM  Result Value Ref Range   aPTT 33 24 - 36 seconds    Comment: Performed at [facility], [address] [place], [place] [address]  Procalcitonin     Status: None   Collection Time: 01/09/23  2:09 AM  Result Value Ref Range   Procalcitonin <0.10 ng/mL    Comment:        Interpretation: PCT (Procalcitonin) <= 0.5 ng/mL: Systemic infection (sepsis) is not likely. Local bacterial infection is possible. (NOTE)       Sepsis PCT Algorithm           Lower Respiratory Tract                                      Infection PCT Algorithm    ----------------------------     ----------------------------         PCT < 0.25 ng/mL                PCT < 0.10 ng/mL          Strongly encourage             Strongly discourage   discontinuation of antibiotics    initiation of antibiotics    ----------------------------     -----------------------------  PCT 0.25 - 0.50 ng/mL            PCT 0.10 - 0.25 ng/mL               OR       >80% decrease in PCT            Discourage initiation of                                            antibiotics      Encourage discontinuation           of antibiotics     ----------------------------     -----------------------------         PCT >= 0.50 ng/mL              PCT 0.26 - 0.50 ng/mL               AND        <80% decrease in PCT             Encourage initiation of                                             antibiotics       Encourage continuation           of antibiotics    ----------------------------     -----------------------------        PCT >= 0.50 ng/mL                  PCT > 0.50 ng/mL               AND         increase in PCT                  Strongly encourage                                      initiation of antibiotics    Strongly encourage escalation           of antibiotics                                     -----------------------------                                           PCT <= 0.25 ng/mL                                                 OR                                        > 80% decrease in PCT  Discontinue / Do not initiate                                             antibiotics  Performed at [facility], [address] [place], [place] [address]   SARS Coronavirus 2 by RT PCR (hospital order, performed in [facility] hospital lab) *cepheid single result test* Anterior Nasal Swab     Status: None   Collection Time: 01/09/23  2:11 AM   Specimen: Anterior Nasal Swab  Result Value Ref Range   SARS Coronavirus 2 by RT PCR NEGATIVE NEGATIVE    Comment: (NOTE) SARS-CoV-2 target nucleic acids are NOT DETECTED.  The SARS-CoV-2 RNA is generally detectable in upper and lower respiratory specimens during the acute phase of infection. The lowest concentration of SARS-CoV-2 viral copies this assay can detect is 250 copies / mL. A negative result does not preclude SARS-CoV-2 infection and should not be used as the sole basis for treatment or other patient management decisions.  A negative result may occur with improper specimen collection / handling, submission of specimen  other than nasopharyngeal swab, presence of viral mutation(s) within the areas targeted by this assay, and inadequate number of viral copies (<250 copies / mL). A negative result must be combined with clinical observations, patient history, and epidemiological information.  Fact Sheet for Patients:   RoadLapTop.co.za  Fact Sheet for Healthcare Providers: http://kim-miller.com/  This test is not yet approved or  cleared by the [place] FDA and has been authorized for detection and/or diagnosis of SARS-CoV-2 by FDA under an Emergency Use Authorization (EUA).  This EUA will remain in effect (meaning this test can be used) for the duration of the COVID-19 declaration under Section 564(b)(1) of the Act, 21 U.S.C. section 360bbb-3(b)(1), unless the authorization is terminated or revoked sooner.  Performed at [facility], [address] [place], [place] [address]   Urinalysis, Complete w Microscopic -Urine, Clean Catch     Status: Abnormal   Collection Time: 01/09/23  3:35 AM  Result Value Ref Range   Color, Urine YELLOW (A) YELLOW   APPearance CLOUDY (A) CLEAR   Specific Gravity, Urine 1.017 1.005 - 1.030   pH 5.0 5.0 - 8.0   Glucose, UA NEGATIVE NEGATIVE mg/dL   Hgb urine dipstick MODERATE (A) NEGATIVE   Bilirubin Urine NEGATIVE NEGATIVE   Ketones, ur NEGATIVE NEGATIVE mg/dL   Protein, ur 098 (A) NEGATIVE mg/dL   Nitrite NEGATIVE NEGATIVE   Leukocytes,Ua LARGE (A) NEGATIVE   RBC / HPF >50 0 - 5 RBC/hpf   WBC, UA >50 0 - 5 WBC/hpf   Bacteria, UA MANY (A) NONE SEEN   Squamous Epithelial / HPF 0-5 0 - 5 /HPF   WBC Clumps PRESENT    Mucus PRESENT     Comment: Performed at [facility], [address] [place], [place] [address]   DG Chest Port 1 View  Result Date: 01/09/2023 CLINICAL DATA:  Sepsis EXAM: PORTABLE CHEST 1 VIEW COMPARISON:  01/06/2023 FINDINGS: Stable mild left basilar atelectasis or infiltrate.  Lung volumes are small, but are symmetric. No pneumothorax or pleural effusion. Cardiac size within normal limits. Pulmonary vascularity is normal. No acute bone abnormality. IMPRESSION: 1. Pulmonary hypoinflation. 2. Stable mild left basilar atelectasis or infiltrate. Electronically Signed   By: Helyn Numbers M.D.   On: 01/09/2023 02:22  Pending Labs Unresulted Labs (From admission, onward)     Start     Ordered   01/09/23 0159  Lactic acid, plasma  (Undifferentiated presentation (screening labs and basic nursing orders))  Now then every 2 hours,   STAT      01/09/23 0158   01/09/23 0159  Blood Culture (routine x 2)  (Undifferentiated presentation (screening labs and basic nursing orders))  BLOOD CULTURE X 2,   STAT      01/09/23 0158   01/09/23 0159  Urine Culture (for pregnant, neutropenic or urologic patients or patients with an indwelling urinary catheter)  (Undifferentiated presentation (screening labs and basic nursing orders))  Once,   URGENT       Question:  Indication  Answer:  Sepsis   01/09/23 0158            Vitals/Pain Today's Vitals   01/09/23 0145 01/09/23 0236 01/09/23 0330 01/09/23 0400  BP:  119/60  101/70  Pulse:  (!) 113 (!) 107 (!) 106  Resp:  (!) 25 (!) 23 18  Temp:  100.1 F (37.8 C) 99.8 F (37.7 C)   TempSrc:  Oral Oral   SpO2:  98% 95% 100%  Weight:  100.7 kg    Height:  5\' 10"  (1.778 m)    PainSc: 0-No pain       Isolation Precautions Airborne and Contact precautions  Medications Medications  azithromycin (ZITHROMAX) 500 mg in sodium chloride 0.9 % 250 mL IVPB (500 mg Intravenous New Bag/Given 01/09/23 0400)  acetaminophen (TYLENOL) tablet 1,000 mg (1,000 mg Oral Given by EMS 01/09/23 0218)  lactated ringers bolus 1,000 mL (0 mLs Intravenous Stopped 01/09/23 0329)  potassium chloride SA (KLOR-CON M) CR tablet 40 mEq (40 mEq Oral Given 01/09/23 0330)  cefTRIAXone (ROCEPHIN) 2 g in sodium chloride 0.9 % 100 mL IVPB (0 g Intravenous Stopped 01/09/23  0400)    Mobility walks with device     Focused Assessments Cardiac Assessment Handoff:    No results found for: "CKTOTAL", "CKMB", "CKMBINDEX", "TROPONINI" No results found for: "DDIMER" Does the Patient currently have chest pain? No    R Recommendations: See Admitting Provider Note  Report given to:   Additional Notes: N/A

## 2023-01-09 NOTE — Progress Notes (Signed)
Patient arrived on unit accompanied by ED RN. Alert and Oriented, verbalizing understanding of POC. Skin assessment complete with 2 Rns. Physical assessment per flow sheet. VSS, afebrile, pain/discomfort denied and no acute distress noted. Bed locked and lowered. Call bell and necessities within reach, [facility]

## 2023-01-10 ENCOUNTER — Ambulatory Visit: Payer: BC Managed Care – PPO | Admitting: Occupational Therapy

## 2023-01-10 ENCOUNTER — Ambulatory Visit: Payer: BC Managed Care – PPO | Admitting: Physical Therapy

## 2023-01-10 LAB — GASTROINTESTINAL PANEL BY PCR, STOOL (REPLACES STOOL CULTURE)

## 2023-01-10 LAB — PHOSPHORUS: Phosphorus: 2.2 mg/dL — ABNORMAL LOW (ref 2.5–4.6)

## 2023-01-10 LAB — CBC
HCT: 34.2 % — ABNORMAL LOW (ref 39.0–52.0)
Hemoglobin: 11 g/dL — ABNORMAL LOW (ref 13.0–17.0)
MCH: 25.9 pg — ABNORMAL LOW (ref 26.0–34.0)
MCHC: 32.2 g/dL (ref 30.0–36.0)
MCV: 80.7 fL (ref 80.0–100.0)
Platelets: 205 10*3/uL (ref 150–400)
RBC: 4.24 MIL/uL (ref 4.22–5.81)
RDW: 15.1 % (ref 11.5–15.5)
WBC: 10.8 10*3/uL — ABNORMAL HIGH (ref 4.0–10.5)
nRBC: 0 % (ref 0.0–0.2)

## 2023-01-10 LAB — C DIFFICILE QUICK SCREEN W PCR REFLEX
C Diff antigen: NEGATIVE
C Diff interpretation: NOT DETECTED
C Diff toxin: NEGATIVE

## 2023-01-10 LAB — BASIC METABOLIC PANEL
Anion gap: 9 (ref 5–15)
BUN: 12 mg/dL (ref 8–23)
CO2: 23 mmol/L (ref 22–32)
Calcium: 8.4 mg/dL — ABNORMAL LOW (ref 8.9–10.3)
Chloride: 106 mmol/L (ref 98–111)
Creatinine, Ser: 0.79 mg/dL (ref 0.61–1.24)
GFR, Estimated: >60 mL/min (ref >60)
Glucose, Bld: 106 mg/dL — ABNORMAL HIGH (ref 70–99)
Potassium: 3 mmol/L — ABNORMAL LOW (ref 3.5–5.1)
Sodium: 138 mmol/L (ref 135–145)

## 2023-01-10 LAB — URINE CULTURE: Culture: 60000 — AB

## 2023-01-10 LAB — CULTURE, BLOOD (ROUTINE X 2): Culture: NO GROWTH

## 2023-01-10 LAB — MAGNESIUM: Magnesium: 1.7 mg/dL (ref 1.7–2.4)

## 2023-01-10 LAB — STREP PNEUMONIAE URINARY ANTIGEN: Strep Pneumo Urinary Antigen: NEGATIVE

## 2023-01-10 LAB — PROCALCITONIN: Procalcitonin: 0.13 ng/mL

## 2023-01-10 MED ORDER — LOPERAMIDE HCL 2 MG PO CAPS: 2.0000 mg | ORAL_CAPSULE | ORAL | Status: DC | PRN

## 2023-01-10 MED ORDER — POTASSIUM CHLORIDE CRYS ER 20 MEQ PO TBCR
40.0000 meq | EXTENDED_RELEASE_TABLET | Freq: Once | ORAL | Status: AC
  Administered 2023-01-10: 40 meq via ORAL
  Filled 2023-01-10: qty 2

## 2023-01-10 MED ORDER — K PHOS MONO-SOD PHOS DI & MONO 155-852-130 MG PO TABS
500.0000 mg | ORAL_TABLET | Freq: Three times a day (TID) | ORAL | Status: DC
  Administered 2023-01-10: 500 mg via ORAL
  Filled 2023-01-10: qty 2

## 2023-01-10 NOTE — Hospital Course (Signed)
71 y.o.m w/ history of stroke with mild right-sided weakness, hypertension, hyperlipidemia, GERD, obesity, poststroke epilepsy, who presents with fever, increased urinary frequency. He was recently hospitalized from 6/26 - 6/27 due to TIA, and had a negative MRI.  Patient was discharged on azithromycin for possible bronchitis. He developed fever and chills,increased urinary frequency. He has cough with yellow-colored sputum production In ED WBC 13.8, lactic acid 1.2, 3.1, procal< 0.10, positive urine analysis (cloudy appearance, large amount of leukocyte, many bacteria, WBC> 50), GFR> 60, potassium 2.5, magnesium of 1.5, phosphorus of 1.3, INR 1.3, PTT 33, negative COVID PCR.  Temperature 102, blood pressure 112/67, heart rate 122, RR 25, oxygen saturation 95% on room air.  CXR- Mild left basilar infiltration. He was admitted for further management. Patient procalcitonin was reassuring, completed azithromycin, continued on ceftriaxone for UTI> urine culture came back positive for E. coli sensitive to Keflex and ceftriaxone.  Patient had multiple episodes of diarrhea GI panel and C. difficile was negative.  He had hypokalemia that was replaced aggressively and normalized. His  diarrhea [place] better, tolerating well.

## 2023-01-10 NOTE — Progress Notes (Signed)
PROGRESS NOTE Mark Durham  GEX:[id number] DOB: Oct 24, 1950 DOA: 01/09/2023 PCP: [facility]  Brief Narrative/Hospital Course: 72 y.o.m w/ history of stroke with mild right-sided weakness, hypertension, hyperlipidemia, GERD, obesity, poststroke epilepsy, who presents with fever, increased urinary frequency. He was recently hospitalized from 6/26 - 6/27 due to TIA, and had a negative MRI.  Patient was discharged on azithromycin for possible bronchitis. He developed fever and chills,increased urinary frequency. He has cough with yellow-colored sputum production In ED WBC 13.8, lactic acid 1.2, 3.1, procal< 0.10, positive urine analysis (cloudy appearance, large amount of leukocyte, many bacteria, WBC> 50), GFR> 60, potassium 2.5, magnesium of 1.5, phosphorus of 1.3, INR 1.3, PTT 33, negative COVID PCR.  Temperature 102, blood pressure 112/67, heart rate 122, RR 25, oxygen saturation 95% on room air.  CXR- Mild left basilar infiltration. He was admitted for further management    Subjective: Patient seen and examined this morning Resting comfortably on the bedside chair Reporting diarrhea Afebrile since admission, BP stable, not hypoxic Labs WBC downtrending lactic acidosis resolved hypokalemia and hypophosphatemia noted He denies any cough abdominal pain Assessment and Plan: Principal Problem:   Severe sepsis (HCC) Active Problems:   UTI (urinary tract infection)   CAP (community acquired pneumonia)   Stroke (HCC)   HTN (hypertension)   HLD (hyperlipidemia)   Hypokalemia   Hypomagnesemia   Hypophosphatemia   Tobacco use   Obesity (BMI 30-39.9)  Severe sepsis POA source UTI: aFebrile since admission lactic acidosis resolved, WBC downtrending.  Continue empiric Rocephin follow-up urine and blood culture.  Procalcitonin reassuring.  Cough likely bronchitis: Procalcitonin reassuring.  Chest x-ray with mild left basilar infiltration will add incentive spirometry as it is  likely atelectasis related.  Recently placed on empiric azithromycin continue the same, antitussives and follow-up culture data  Diarrheal symptoms: No abdominal pain or tenderness.  Check C. difficile and GI pathogen panel  Stroke with mild right-sided weakness Recent TIA: Continue home aspirin and Lipitor.  Intact neurologically   HTN -Hold amlodipine since patient is at risk of developing hypotension due to severe sepsis -IV hydralazine as needed   HLD  -Lipitor   Hypokalemia, Hypomagnesemia, Hypophosphatemia: Will replete electrolytes and monitor Recent Labs  Lab 01/05/23 2005 01/06/23 0519 01/09/23 0209 01/10/23 0524  K 3.3* 3.3* 2.5* 3.0*  CALCIUM 9.4 9.3 8.4* 8.4*  MG  --   --  1.5* 1.7  PHOS  --   --  1.3* 2.2*   Tobacco use Cont nicotine patch   Class I Obesity:Patient's Body mass index is 31.85 kg/m. : Will benefit with PCP follow-up, weight loss  healthy lifestyle and outpatient sleep evaluation.   DVT prophylaxis: lovenox Code Status:   Code Status: Full Code Family Communication: plan of care discussed with patient/ at bedside. Patient status is: Inpatient because of UTI Level of care: Telemetry Medical   Dispo: The patient is from: home            Anticipated disposition: home 1-2 days Objective: Vitals last 24 hrs: Vitals:   01/09/23 0826 01/09/23 1210 01/09/23 1942 01/10/23 0014  BP: 112/67 122/62 119/71 123/71  Pulse: (!) 102 98 95 95  Resp: 16 16 17 16   Temp: 99.3 F (37.4 C) 99.2 F (37.3 C) 99.7 F (37.6 C) 98.4 F (36.9 C)  TempSrc:   Oral Oral  SpO2: 98% 99% 95% 95%  Weight:      Height:       Weight change:   Physical Examination:  General exam: alert awake, older than stated age HEENT:Oral mucosa moist, Ear/Nose WNL grossly Respiratory system: bilaterally clear BS, no use of accessory muscle Cardiovascular system: S1 & S2 +, No JVD. Gastrointestinal system: Abdomen soft, obese NT,ND, BS+ Nervous System:Alert, awake, moving  extremities. Extremities: LE edema neg,distal peripheral pulses palpable.  Skin: No rashes,no icterus. MSK: Normal muscle bulk,tone, power  Medications reviewed:  Scheduled Meds:  aspirin EC  81 mg Oral Daily   atorvastatin  80 mg Oral Daily   enoxaparin (LOVENOX) injection  50 mg Subcutaneous Q24H   lacosamide  200 mg Oral BID   melatonin  2.5 mg Oral QHS   nicotine  21 mg Transdermal Daily   senna  2 tablet Oral QHS   Continuous Infusions:  sodium chloride     azithromycin (ZITHROMAX) 500 mg in sodium chloride 0.9 % 250 mL IVPB 500 mg (01/10/23 0539)   cefTRIAXone (ROCEPHIN)  IV 2 g (01/10/23 0044)   lactated ringers 75 mL/hr at 01/09/23 2036    Diet Order             Diet Heart Room service appropriate? Yes; Fluid consistency: Thin  Diet effective now                  Intake/Output Summary (Last 24 hours) at 01/10/2023 0719 Last data filed at 01/10/2023 0641 Gross per 24 hour  Intake 2314.69 ml  Output 1250 ml  Net 1064.69 ml   Net IO Since Admission: 2,447.68 mL [01/10/23 0719]  Wt Readings from Last 3 Encounters:  01/09/23 100.7 kg  01/05/23 98.2 kg  11/18/21 95.3 kg     Unresulted Labs (From admission, onward)     Start     Ordered   01/10/23 0615  Procalcitonin  Add-on,   AD       References:    Procalcitonin Lower Respiratory Tract Infection AND Sepsis Procalcitonin Algorithm   01/10/23 0614   01/09/23 0848  Legionella Pneumophila Serogp 1 Ur Ag  Once,   R        01/09/23 0847   01/09/23 0159  Blood Culture (routine x 2)  (Undifferentiated presentation (screening labs and basic nursing orders))  BLOOD CULTURE X 2,   STAT      01/09/23 0158   01/09/23 0159  Urine Culture (for pregnant, neutropenic or urologic patients or patients with an indwelling urinary catheter)  (Undifferentiated presentation (screening labs and basic nursing orders))  Once,   URGENT       Question:  Indication  Answer:  Sepsis   01/09/23 0158          Data Reviewed: I have  personally reviewed following labs and imaging studies CBC: Recent Labs  Lab 01/05/23 2005 01/06/23 0519 01/09/23 0209 01/10/23 0524  WBC 7.9 7.6 13.8* 10.8*  NEUTROABS 4.2  --  11.8*  --   HGB 14.6 13.8 12.7* 11.0*  HCT 46.6 43.6 39.6 34.2*  MCV 81.8 81.3 81.1 80.7  PLT 153 244 235 205   Basic Metabolic Panel: Recent Labs  Lab 01/05/23 2005 01/06/23 0519 01/09/23 0209 01/10/23 0524  NA 137 137 133* 138  K 3.3* 3.3* 2.5* 3.0*  CL 104 106 103 106  CO2 23 24 19* 23  GLUCOSE 125* 111* 143* 106*  BUN 16 13 20 12   CREATININE 0.99 0.78 1.07 0.79  CALCIUM 9.4 9.3 8.4* 8.4*  MG  --   --  1.5* 1.7  PHOS  --   --  1.3* 2.2*  Recent Labs  Lab 01/05/23 2005 01/09/23 0209  AST 17 16  ALT 18 13  ALKPHOS 129* 86  BILITOT 0.9 1.5*  PROT 8.8* 7.5  ALBUMIN 4.4 3.6   Recent Labs  Lab 01/05/23 2005 01/09/23 0209  INR 1.1 1.3*   Recent Labs  Lab 01/05/23 1952  GLUCAP 134*  Sepsis Labs: Recent Labs  Lab 01/09/23 0209 01/09/23 0630 01/09/23 1044 01/09/23 1341 01/09/23 1659 01/09/23 2011  PROCALCITON <0.10  --   --   --   --   --   LATICACIDVEN 1.2   < > 2.4* 2.6* 1.4 1.0   < > = values in this interval not displayed.    Recent Results (from the past 240 hour(s))  Blood Culture (routine x 2)     Status: None (Preliminary result)   Collection Time: 01/09/23  2:09 AM   Specimen: BLOOD LEFT ARM  Result Value Ref Range Status   Specimen Description BLOOD LEFT ARM  Final   Special Requests   Final    BOTTLES DRAWN AEROBIC AND ANAEROBIC Blood Culture results may not be optimal due to an inadequate volume of blood received in culture bottles   Culture   Final    NO GROWTH < 12 HOURS Performed at [facility], [address], [place], [place] [address]    Report Status PENDING  Incomplete  SARS Coronavirus 2 by RT PCR (hospital order, performed in [facility] hospital lab) *cepheid single result test* Anterior Nasal Swab     Status: None   Collection Time:  01/09/23  2:11 AM   Specimen: Anterior Nasal Swab  Result Value Ref Range Status   SARS Coronavirus 2 by RT PCR NEGATIVE NEGATIVE Final    Comment: (NOTE) SARS-CoV-2 target nucleic acids are NOT DETECTED.  The SARS-CoV-2 RNA is generally detectable in upper and lower respiratory specimens during the acute phase of infection. The lowest concentration of SARS-CoV-2 viral copies this assay can detect is 250 copies / mL. A negative result does not preclude SARS-CoV-2 infection and should not be used as the sole basis for treatment or other patient management decisions.  A negative result may occur with improper specimen collection / handling, submission of specimen other than nasopharyngeal swab, presence of viral mutation(s) within the areas targeted by this assay, and inadequate number of viral copies (<250 copies / mL). A negative result must be combined with clinical observations, patient history, and epidemiological information.  Fact Sheet for Patients:   RoadLapTop.co.za  Fact Sheet for Healthcare Providers: http://kim-miller.com/  This test is not yet approved or  cleared by the [place] FDA and has been authorized for detection and/or diagnosis of SARS-CoV-2 by FDA under an Emergency Use Authorization (EUA).  This EUA will remain in effect (meaning this test can be used) for the duration of the COVID-19 declaration under Section 564(b)(1) of the Act, 21 U.S.C. section 360bbb-3(b)(1), unless the authorization is terminated or revoked sooner.  Performed at [facility], [address] [place], [place] [address]   Expectorated Sputum Assessment w Gram Stain, Rflx to Resp Cult     Status: None   Collection Time: 01/09/23  3:00 PM   Specimen: Sputum  Result Value Ref Range Status   Specimen Description SPUTUM  Final   Special Requests NONE  Final   Sputum evaluation   Final    Sputum specimen not acceptable for  testing.  Please recollect.   C/MORGAN CARDWELL AT 1553 01/09/23.PMF Performed at [facility], [address] [place], [place] [address]    Report Status 01/09/2023 FINAL  Final    Antimicrobials: Anti-infectives (From admission, onward)    Start     Dose/Rate Route Frequency Ordered Stop   01/10/23 0600  azithromycin (ZITHROMAX) 500 mg in sodium chloride 0.9 % 250 mL IVPB        500 mg 250 mL/hr over 60 Minutes Intravenous Every 24 hours 01/09/23 0847     01/10/23 0000  cefTRIAXone (ROCEPHIN) 2 g in sodium chloride 0.9 % 100 mL IVPB        2 g 200 mL/hr over 30 Minutes Intravenous Every 24 hours 01/09/23 0535 01/16/23 2359   01/09/23 0330  cefTRIAXone (ROCEPHIN) 2 g in sodium chloride 0.9 % 100 mL IVPB        2 g 200 mL/hr over 30 Minutes Intravenous  Once 01/09/23 0317 01/09/23 0400   01/09/23 0330  azithromycin (ZITHROMAX) 500 mg in sodium chloride 0.9 % 250 mL IVPB        500 mg 250 mL/hr over 60 Minutes Intravenous  Once 01/09/23 0317 01/09/23 0500      Culture/Microbiology    Component Value Date/Time   SDES SPUTUM 01/09/2023 1500   SPECREQUEST NONE 01/09/2023 1500   CULT  01/09/2023 0209    NO GROWTH < 12 HOURS Performed at [facility], [address] [place], [place] [address]    REPTSTATUS 01/09/2023 FINAL 01/09/2023 1500  Radiology Studies: [facility] Chest Port 1 View  Result Date: 01/09/2023 CLINICAL DATA:  Sepsis EXAM: PORTABLE CHEST 1 VIEW COMPARISON:  01/06/2023 FINDINGS: Stable mild left basilar atelectasis or infiltrate. Lung volumes are small, but are symmetric. No pneumothorax or pleural effusion. Cardiac size within normal limits. Pulmonary vascularity is normal. No acute bone abnormality. IMPRESSION: 1. Pulmonary hypoinflation. 2. Stable mild left basilar atelectasis or infiltrate. Electronically Signed   By: Helyn Numbers M.D.   On: 01/09/2023 02:22     LOS: 1 day   Lanae Boast, MD [facility]  01/10/2023, 7:19 AM

## 2023-01-11 ENCOUNTER — Ambulatory Visit: Payer: BC Managed Care – PPO | Admitting: Physical Therapy

## 2023-01-11 LAB — CBC
HCT: 38.3 % — ABNORMAL LOW (ref 39.0–52.0)
Hemoglobin: 12 g/dL — ABNORMAL LOW (ref 13.0–17.0)
MCH: 25.4 pg — ABNORMAL LOW (ref 26.0–34.0)
MCHC: 31.3 g/dL (ref 30.0–36.0)
MCV: 81.1 fL (ref 80.0–100.0)
Platelets: 232 10*3/uL (ref 150–400)
RBC: 4.72 MIL/uL (ref 4.22–5.81)
RDW: 14.6 % (ref 11.5–15.5)
WBC: 7 10*3/uL (ref 4.0–10.5)
nRBC: 0 % (ref 0.0–0.2)

## 2023-01-11 LAB — BASIC METABOLIC PANEL
Anion gap: 8 (ref 5–15)
Anion gap: 8 (ref 5–15)
BUN: 10 mg/dL (ref 8–23)
BUN: 10 mg/dL (ref 8–23)
CO2: 23 mmol/L (ref 22–32)
CO2: 22 mmol/L (ref 22–32)
Calcium: 9 mg/dL (ref 8.9–10.3)
Calcium: 9 mg/dL (ref 8.9–10.3)
Chloride: 106 mmol/L (ref 98–111)
Chloride: 105 mmol/L (ref 98–111)
Creatinine, Ser: 0.82 mg/dL (ref 0.61–1.24)
Creatinine, Ser: 0.75 mg/dL (ref 0.61–1.24)
GFR, Estimated: >60 mL/min (ref >60)
GFR, Estimated: >60 mL/min (ref >60)
Glucose, Bld: 110 mg/dL — ABNORMAL HIGH (ref 70–99)
Glucose, Bld: 95 mg/dL (ref 70–99)
Potassium: 2.9 mmol/L — ABNORMAL LOW (ref 3.5–5.1)
Potassium: 3.8 mmol/L (ref 3.5–5.1)
Sodium: 137 mmol/L (ref 135–145)
Sodium: 135 mmol/L (ref 135–145)

## 2023-01-11 LAB — URINE CULTURE

## 2023-01-11 MED ORDER — POTASSIUM CHLORIDE 10 MEQ/100ML IV SOLN
10.0000 meq | INTRAVENOUS | Status: AC
  Administered 2023-01-11 (×2): 10 meq via INTRAVENOUS
  Filled 2023-01-11: qty 100

## 2023-01-11 MED ORDER — POTASSIUM CHLORIDE CRYS ER 20 MEQ PO TBCR
40.0000 meq | EXTENDED_RELEASE_TABLET | ORAL | Status: AC
  Administered 2023-01-11 (×2): 40 meq via ORAL
  Filled 2023-01-11 (×2): qty 2

## 2023-01-11 MED ORDER — DIPHENOXYLATE-ATROPINE 2.5-0.025 MG PO TABS: 1.0000 | ORAL_TABLET | Freq: Four times a day (QID) | ORAL | Status: DC | PRN

## 2023-01-11 MED ORDER — TAMSULOSIN HCL 0.4 MG PO CAPS
0.4000 mg | ORAL_CAPSULE | Freq: Every day | ORAL | Status: DC
  Administered 2023-01-11: 0.4 mg via ORAL
  Filled 2023-01-11: qty 1

## 2023-01-11 NOTE — Progress Notes (Signed)
PROGRESS NOTE Mark Durham  MVH:[id number] DOB: 07-Aug-1950 DOA: 01/09/2023 PCP: [facility]  Brief Narrative/Hospital Course: 72 y.o.m w/ history of stroke with mild right-sided weakness, hypertension, hyperlipidemia, GERD, obesity, poststroke epilepsy, who presents with fever, increased urinary frequency. He was recently hospitalized from 6/26 - 6/27 due to TIA, and had a negative MRI.  Patient was discharged on azithromycin for possible bronchitis. He developed fever and chills,increased urinary frequency. He has cough with yellow-colored sputum production In ED WBC 13.8, lactic acid 1.2, 3.1, procal< 0.10, positive urine analysis (cloudy appearance, large amount of leukocyte, many bacteria, WBC> 50), GFR> 60, potassium 2.5, magnesium of 1.5, phosphorus of 1.3, INR 1.3, PTT 33, negative COVID PCR.  Temperature 102, blood pressure 112/67, heart rate 122, RR 25, oxygen saturation 95% on room air.  CXR- Mild left basilar infiltration. He was admitted for further management    Subjective: Patient seen and examined this morning Reports he feels better Having diarrhea Labs w/ k very low  Assessment and Plan: Principal Problem:   Severe sepsis (HCC) Active Problems:   UTI (urinary tract infection)   CAP (community acquired pneumonia)   Stroke (HCC)   HTN (hypertension)   HLD (hyperlipidemia)   Hypokalemia   Hypomagnesemia   Hypophosphatemia   Tobacco use   Obesity (BMI 30-39.9)  Severe sepsis POA due to E. coli UTI:  No recurrence of fever, lactic acidosis resolved leukocytosis improved, urine culture E. coli blood culture no growth, managed w/ iv ceftriaxone discharge switch to Keflex    Cough likely bronchitis: Procalcitonin reassuring.  Chest x-ray with mild left basilar infiltration .  Patient completed azithromycin on ceftriaxone as above.  Continue incentive spirometry ambulation antitussive  Diarrhea: No abdominal pain or tenderness. negative C. difficile and GI  pathogen panel. Cont Imodium.   Stroke with mild right-sided weakness Recent TIA: Stable, continue home aspirin and Lipitor.  Intact neurologically   HTN BP remains stable holding amlodipine for now. Can resume upon discharge   HLD  Cont lipitor   Hypokalemia, Hypomagnesemia, Hypophosphatemia: Mag and Phos improved, potassium persistently low -suspect in the setting of decreased oral intake and diarrhea, aggressively replaced.  Recheck lab Recent Labs  Lab 01/05/23 2005 01/06/23 0519 01/09/23 0209 01/10/23 0524 01/11/23 0455  K 3.3* 3.3* 2.5* 3.0* 2.9*  CALCIUM 9.4 9.3 8.4* 8.4* 9.0  MG  --   --  1.5* 1.7  --   PHOS  --   --  1.3* 2.2*  --     Tobacco use Cont nicotine patch   Class I Obesity:Patient's Body mass index is 31.85 kg/m. : Will benefit with PCP follow-up, weight loss  healthy lifestyle and outpatient sleep evaluation.   DVT prophylaxis: lovenox Code Status:   Code Status: Full Code Family Communication: plan of care discussed with patient/ at bedside. Patient status is: Inpatient because of UTI Level of care: Telemetry Medical   Dispo: The patient is from: home            Anticipated disposition: home today or tomorrow if diarrhea and electrolytes improves  Objective: Vitals last 24 hrs: Vitals:   01/10/23 1609 01/10/23 2024 01/11/23 0031 01/11/23 0601  BP: 133/68 (!) 141/72 (!) 148/86 (!) 145/75  Pulse: 79 84 91 82  Resp: 16 18 18 18   Temp: 97.9 F (36.6 C) 98.4 F (36.9 C) 98.1 F (36.7 C) 97.8 F (36.6 C)  TempSrc: Oral     SpO2: 98% 99% 100% 96%  Weight:  Height:       Weight change:   Physical Examination: General exam: alert awake, oriented at baseline, older than stated age HEENT:Oral mucosa moist, Ear/Nose WNL grossly Respiratory system: Bilaterally clear BS,no use of accessory muscle Cardiovascular system: S1 & S2 +, No JVD. Gastrointestinal system: Abdomen soft,NT,ND, BS+ Nervous System: Alert, awake, moving all  extremities,and following commands. Extremities: LE edema neg,distal peripheral pulses palpable and warm.  Skin: No rashes,no icterus. MSK: Normal muscle bulk,tone, power   Medications reviewed:  Scheduled Meds:  aspirin EC  81 mg Oral Daily   atorvastatin  80 mg Oral Daily   enoxaparin (LOVENOX) injection  50 mg Subcutaneous Q24H   lacosamide  200 mg Oral BID   melatonin  2.5 mg Oral QHS   nicotine  21 mg Transdermal Daily   potassium chloride  40 mEq Oral Q3H   senna  2 tablet Oral QHS   Continuous Infusions:  sodium chloride     cefTRIAXone (ROCEPHIN)  IV 2 g (01/11/23 0028)   lactated ringers 75 mL/hr at 01/09/23 2036   potassium chloride      Diet Order             Diet Heart Room service appropriate? Yes; Fluid consistency: Thin  Diet effective now                  Intake/Output Summary (Last 24 hours) at 01/11/2023 0736 Last data filed at 01/10/2023 1905 Gross per 24 hour  Intake 2225.78 ml  Output --  Net 2225.78 ml    Net IO Since Admission: 4,673.46 mL [01/11/23 0736]  Wt Readings from Last 3 Encounters:  01/09/23 100.7 kg  01/05/23 98.2 kg  11/18/21 95.3 kg     Unresulted Labs (From admission, onward)     Start     Ordered   01/11/23 0500  Basic metabolic panel  Daily,   R      01/10/23 0725   01/11/23 0500  CBC  Daily,   R      01/10/23 0725   01/09/23 0848  Legionella Pneumophila Serogp 1 Ur Ag  Once,   R        01/09/23 0847          Data Reviewed: I have personally reviewed following labs and imaging studies CBC: Recent Labs  Lab 01/05/23 2005 01/06/23 0519 01/09/23 0209 01/10/23 0524 01/11/23 0455  WBC 7.9 7.6 13.8* 10.8* 7.0  NEUTROABS 4.2  --  11.8*  --   --   HGB 14.6 13.8 12.7* 11.0* 12.0*  HCT 46.6 43.6 39.6 34.2* 38.3*  MCV 81.8 81.3 81.1 80.7 81.1  PLT 153 244 235 205 232    Basic Metabolic Panel: Recent Labs  Lab 01/05/23 2005 01/06/23 0519 01/09/23 0209 01/10/23 0524 01/11/23 0455  NA 137 137 133* 138 137  K  3.3* 3.3* 2.5* 3.0* 2.9*  CL 104 106 103 106 106  CO2 23 24 19* 23 23  GLUCOSE 125* 111* 143* 106* 110*  BUN 16 13 20 12 10   CREATININE 0.99 0.78 1.07 0.79 0.82  CALCIUM 9.4 9.3 8.4* 8.4* 9.0  MG  --   --  1.5* 1.7  --   PHOS  --   --  1.3* 2.2*  --     Recent Labs  Lab 01/05/23 2005 01/09/23 0209  AST 17 16  ALT 18 13  ALKPHOS 129* 86  BILITOT 0.9 1.5*  PROT 8.8* 7.5  ALBUMIN 4.4 3.6  Recent Labs  Lab 01/05/23 2005 01/09/23 0209  INR 1.1 1.3*    Recent Labs  Lab 01/05/23 1952  GLUCAP 134*   Sepsis Labs: Recent Labs  Lab 01/09/23 0209 01/09/23 0630 01/09/23 1044 01/09/23 1341 01/09/23 1659 01/09/23 2011 01/10/23 0524  PROCALCITON <0.10  --   --   --   --   --  0.13  LATICACIDVEN 1.2   < > 2.4* 2.6* 1.4 1.0  --    < > = values in this interval not displayed.     Recent Results (from the past 240 hour(s))  Blood Culture (routine x 2)     Status: None (Preliminary result)   Collection Time: 01/09/23  2:09 AM   Specimen: BLOOD LEFT ARM  Result Value Ref Range Status   Specimen Description BLOOD LEFT ARM  Final   Special Requests   Final    BOTTLES DRAWN AEROBIC AND ANAEROBIC Blood Culture results may not be optimal due to an inadequate volume of blood received in culture bottles   Culture   Final    NO GROWTH 1 DAY Performed at [facility], [address], [place], [place] [address]    Report Status PENDING  Incomplete  SARS Coronavirus 2 by RT PCR (hospital order, performed in [facility] hospital lab) *cepheid single result test* Anterior Nasal Swab     Status: None   Collection Time: 01/09/23  2:11 AM   Specimen: Anterior Nasal Swab  Result Value Ref Range Status   SARS Coronavirus 2 by RT PCR NEGATIVE NEGATIVE Final    Comment: (NOTE) SARS-CoV-2 target nucleic acids are NOT DETECTED.  The SARS-CoV-2 RNA is generally detectable in upper and lower respiratory specimens during the acute phase of infection. The lowest concentration of  SARS-CoV-2 viral copies this assay can detect is 250 copies / mL. A negative result does not preclude SARS-CoV-2 infection and should not be used as the sole basis for treatment or other patient management decisions.  A negative result may occur with improper specimen collection / handling, submission of specimen other than nasopharyngeal swab, presence of viral mutation(s) within the areas targeted by this assay, and inadequate number of viral copies (<250 copies / mL). A negative result must be combined with clinical observations, patient history, and epidemiological information.  Fact Sheet for Patients:   RoadLapTop.co.za  Fact Sheet for Healthcare Providers: http://kim-miller.com/  This test is not yet approved or  cleared by the [place] FDA and has been authorized for detection and/or diagnosis of SARS-CoV-2 by FDA under an Emergency Use Authorization (EUA).  This EUA will remain in effect (meaning this test can be used) for the duration of the COVID-19 declaration under Section 564(b)(1) of the Act, 21 U.S.C. section 360bbb-3(b)(1), unless the authorization is terminated or revoked sooner.  Performed at [facility], [address] [place], [place] [address]   Urine Culture (for pregnant, neutropenic or urologic patients or patients with an indwelling urinary catheter)     Status: Abnormal (Preliminary result)   Collection Time: 01/09/23  3:35 AM   Specimen: Urine, Clean Catch  Result Value Ref Range Status   Specimen Description   Final    URINE, CLEAN CATCH Performed at [facility], [address] [place], [place] [address]    Special Requests   Final    NONE Performed at [facility], [address] [place], [place] [address]    Culture 60,000 COLONIES/mL ESCHERICHIA COLI (A)  Final  Report Status PENDING  Incomplete  Blood Culture (routine x 2)     Status: None (Preliminary result)    Collection Time: 01/09/23  6:30 AM   Specimen: BLOOD  Result Value Ref Range Status   Specimen Description BLOOD BLOOD RIGHT HAND  Final   Special Requests   Final    BOTTLES DRAWN AEROBIC AND ANAEROBIC Blood Culture adequate volume   Culture   Final    NO GROWTH 1 DAY Performed at [facility], [address], [place], [place] [address]    Report Status PENDING  Incomplete  Expectorated Sputum Assessment w Gram Stain, Rflx to Resp Cult     Status: None   Collection Time: 01/09/23  3:00 PM   Specimen: Sputum  Result Value Ref Range Status   Specimen Description SPUTUM  Final   Special Requests NONE  Final   Sputum evaluation   Final    Sputum specimen not acceptable for testing.  Please recollect.   C/MORGAN CARDWELL AT 1553 01/09/23.PMF Performed at [facility], [address] [place], [place] [address]    Report Status 01/09/2023 FINAL  Final  C Difficile Quick Screen w PCR reflex     Status: None   Collection Time: 01/10/23  1:30 PM   Specimen: STOOL  Result Value Ref Range Status   C Diff antigen NEGATIVE NEGATIVE Final   C Diff toxin NEGATIVE NEGATIVE Final   C Diff interpretation No C. difficile detected.  Final    Comment: Performed at [facility], [address] [place], [place] [address]  Gastrointestinal Panel by PCR , Stool     Status: None   Collection Time: 01/10/23  1:30 PM   Specimen: Stool  Result Value Ref Range Status   Campylobacter species NOT DETECTED NOT DETECTED Final   Plesimonas shigelloides NOT DETECTED NOT DETECTED Final   Salmonella species NOT DETECTED NOT DETECTED Final   Yersinia enterocolitica NOT DETECTED NOT DETECTED Final   Vibrio species NOT DETECTED NOT DETECTED Final   Vibrio cholerae NOT DETECTED NOT DETECTED Final   Enteroaggregative E coli (EAEC) NOT DETECTED NOT DETECTED Final   Enteropathogenic E coli (EPEC) NOT DETECTED NOT DETECTED Final   Enterotoxigenic E coli (ETEC) NOT DETECTED NOT DETECTED  Final   Shiga like toxin producing E coli (STEC) NOT DETECTED NOT DETECTED Final   Shigella/Enteroinvasive E coli (EIEC) NOT DETECTED NOT DETECTED Final   Cryptosporidium NOT DETECTED NOT DETECTED Final   Cyclospora cayetanensis NOT DETECTED NOT DETECTED Final   Entamoeba histolytica NOT DETECTED NOT DETECTED Final   Giardia lamblia NOT DETECTED NOT DETECTED Final   Adenovirus F40/41 NOT DETECTED NOT DETECTED Final   Astrovirus NOT DETECTED NOT DETECTED Final   Norovirus GI/GII NOT DETECTED NOT DETECTED Final   Rotavirus A NOT DETECTED NOT DETECTED Final   Sapovirus (I, II, IV, and V) NOT DETECTED NOT DETECTED Final    Comment: Performed at [facility], [address] [place], [place] [address]    Antimicrobials: Anti-infectives (From admission, onward)    Start     Dose/Rate Route Frequency Ordered Stop   01/10/23 0600  azithromycin (ZITHROMAX) 500 mg in sodium chloride 0.9 % 250 mL IVPB  Status:  Discontinued        500 mg 250 mL/hr over 60 Minutes Intravenous Every 24 hours 01/09/23 0847 01/10/23 1234   01/10/23 0000  cefTRIAXone (ROCEPHIN) 2 g in sodium chloride 0.9 % 100 mL IVPB  2 g 200 mL/hr over 30 Minutes Intravenous Every 24 hours 01/09/23 0535 01/16/23 2359   01/09/23 0330  cefTRIAXone (ROCEPHIN) 2 g in sodium chloride 0.9 % 100 mL IVPB        2 g 200 mL/hr over 30 Minutes Intravenous  Once 01/09/23 0317 01/09/23 0400   01/09/23 0330  azithromycin (ZITHROMAX) 500 mg in sodium chloride 0.9 % 250 mL IVPB        500 mg 250 mL/hr over 60 Minutes Intravenous  Once 01/09/23 0317 01/09/23 0500      Culture/Microbiology    Component Value Date/Time   SDES SPUTUM 01/09/2023 1500   SPECREQUEST NONE 01/09/2023 1500   CULT  01/09/2023 0630    NO GROWTH 1 DAY Performed at [facility], [address], [place], [place] [address]    REPTSTATUS 01/09/2023 FINAL 01/09/2023 1500  Radiology Studies: No results found.   LOS: 2 days   Lanae Boast,  MD [facility]  01/11/2023, 7:36 AM

## 2023-01-12 ENCOUNTER — Encounter: Payer: BC Managed Care – PPO | Admitting: Speech Pathology

## 2023-01-12 LAB — MAGNESIUM: Magnesium: 1.7 mg/dL (ref 1.7–2.4)

## 2023-01-12 LAB — BASIC METABOLIC PANEL
Anion gap: 7 (ref 5–15)
BUN: 9 mg/dL (ref 8–23)
CO2: 21 mmol/L — ABNORMAL LOW (ref 22–32)
Calcium: 8.8 mg/dL — ABNORMAL LOW (ref 8.9–10.3)
Chloride: 108 mmol/L (ref 98–111)
Creatinine, Ser: 0.76 mg/dL (ref 0.61–1.24)
GFR, Estimated: >60 mL/min (ref >60)
Glucose, Bld: 97 mg/dL (ref 70–99)
Potassium: 3.3 mmol/L — ABNORMAL LOW (ref 3.5–5.1)
Sodium: 136 mmol/L (ref 135–145)

## 2023-01-12 LAB — CULTURE, BLOOD (ROUTINE X 2): Special Requests: ADEQUATE

## 2023-01-12 LAB — PHOSPHORUS: Phosphorus: 3.4 mg/dL (ref 2.5–4.6)

## 2023-01-12 LAB — LEGIONELLA PNEUMOPHILA SEROGP 1 UR AG: L. pneumophila Serogp 1 Ur Ag: NEGATIVE

## 2023-01-12 MED ORDER — POTASSIUM CHLORIDE CRYS ER 20 MEQ PO TBCR
40.0000 meq | EXTENDED_RELEASE_TABLET | Freq: Once | ORAL | Status: AC
  Administered 2023-01-12: 40 meq via ORAL
  Filled 2023-01-12: qty 2

## 2023-01-12 MED ORDER — TAMSULOSIN HCL 0.4 MG PO CAPS: 0.4000 mg | ORAL_CAPSULE | Freq: Every day | ORAL | 2 refills | Status: AC

## 2023-01-12 MED ORDER — CEPHALEXIN 500 MG PO CAPS: 500.0000 mg | ORAL_CAPSULE | Freq: Three times a day (TID) | ORAL | Status: DC

## 2023-01-12 MED ORDER — CEPHALEXIN 500 MG PO CAPS: 500.0000 mg | ORAL_CAPSULE | Freq: Three times a day (TID) | ORAL | 0 refills | Status: AC

## 2023-01-12 NOTE — TOC Transition Note (Signed)
Transition of Care [facility]) - CM/SW Discharge Note   Patient Details  Name: Mark Durham MRN: [id number] Date of Birth: [birth year]  [facility]) CM/SW Contact:  Allena Katz, LCSW Phone Number: 01/12/2023, 2:13 PM   Clinical Narrative:   Pt has orders to discharge home with outpatient rehab at [facility]. No additional needs at this time.     Final next level of care: OP Rehab Barriers to Discharge: Barriers Resolved   Patient Goals and CMS Choice      Discharge Placement                         Discharge Plan and Services Additional resources added to the After Visit Summary for                                       Social Determinants of Health (SDOH) Interventions SDOH Screenings   Tobacco Use: High Risk (01/09/2023)     Readmission Risk Interventions     No data to display

## 2023-01-12 NOTE — Progress Notes (Signed)
Mobility Specialist - Progress Note   01/12/23 1129  Mobility  Activity Ambulated with assistance in room;Ambulated with assistance in hallway  Level of Assistance Contact guard assist, steadying assist  Assistive Device Front wheel walker  Distance Ambulated (ft) 80 ft  Activity Response Tolerated well  $Mobility charge 1 Mobility  Mobility Specialist Start Time (ACUTE ONLY) 1048  Mobility Specialist Stop Time (ACUTE ONLY) 1058  Mobility Specialist Time Calculation (min) (ACUTE ONLY) 10 min   Pt sitting in the recliner upon entry, utilizing RA. Pt agreeable to amb in the hallway this date. Pt STS to RW minA-- Bilateral knees not fully extended upon standing, unable to self correct. Pt amb 80 ft in the hallway CGA-SBA before returning to the room. Pt left seated in the recliner with alarm set and needs within reach.   Zetta Bills Mobility Specialist 01/12/23 11:34 AM

## 2023-01-12 NOTE — TOC Progression Note (Signed)
Transition of Care [facility]) - Progression Note    Patient Details  Name: Mark Durham MRN: [id number] Date of Birth: April 27, 1951  Transition of Care [facility]) CM/SW Contact  Allena Katz, LCSW Phone Number: 01/12/2023, 10:34 AM  Clinical Narrative:   CSW spoke with pt about outpatient PT. Pt is agreeable and reports his wife will drive him. Pt reports he has been to [facility] outpatient in past and would like to go again. Pt has a RW at home and is active with [facility] for primary care.        Expected Discharge Plan and Services                                               Social Determinants of Health (SDOH) Interventions SDOH Screenings   Tobacco Use: High Risk (01/09/2023)    Readmission Risk Interventions     No data to display

## 2023-01-12 NOTE — Plan of Care (Signed)
  Problem: Education: Goal: Knowledge of disease or condition will improve Outcome: Completed/Met Goal: Knowledge of secondary prevention will improve (MUST DOCUMENT ALL) Outcome: Completed/Met Goal: Knowledge of patient specific risk factors will improve Loraine Leriche N/A or DELETE if not current risk factor) Outcome: Completed/Met   Problem: Ischemic Stroke/TIA Tissue Perfusion: Goal: Complications of ischemic stroke/TIA will be minimized Outcome: Completed/Met   Problem: Coping: Goal: Will verbalize positive feelings about self Outcome: Completed/Met Goal: Will identify appropriate support needs Outcome: Completed/Met   Problem: Health Behavior/Discharge Planning: Goal: Ability to manage health-related needs will improve Outcome: Completed/Met Goal: Goals will be collaboratively established with patient/family Outcome: Completed/Met   Problem: Self-Care: Goal: Ability to participate in self-care as condition permits will improve Outcome: Completed/Met Goal: Verbalization of feelings and concerns over difficulty with self-care will improve Outcome: Completed/Met Goal: Ability to communicate needs accurately will improve Outcome: Completed/Met   Problem: Nutrition: Goal: Risk of aspiration will decrease Outcome: Completed/Met Goal: Dietary intake will improve Outcome: Completed/Met   Problem: Fluid Volume: Goal: Hemodynamic stability will improve Outcome: Completed/Met   Problem: Clinical Measurements: Goal: Diagnostic test results will improve Outcome: Completed/Met Goal: Signs and symptoms of infection will decrease Outcome: Completed/Met   Problem: Respiratory: Goal: Ability to maintain adequate ventilation will improve Outcome: Completed/Met   Problem: Education: Goal: Knowledge of General Education information will improve Description: Including pain rating scale, medication(s)/side effects and non-pharmacologic comfort measures Outcome: Completed/Met   Problem:  Health Behavior/Discharge Planning: Goal: Ability to manage health-related needs will improve Outcome: Completed/Met   Problem: Clinical Measurements: Goal: Ability to maintain clinical measurements within normal limits will improve Outcome: Completed/Met Goal: Will remain free from infection Outcome: Completed/Met Goal: Diagnostic test results will improve Outcome: Completed/Met Goal: Respiratory complications will improve Outcome: Completed/Met Goal: Cardiovascular complication will be avoided Outcome: Completed/Met   Problem: Activity: Goal: Risk for activity intolerance will decrease Outcome: Completed/Met   Problem: Nutrition: Goal: Adequate nutrition will be maintained Outcome: Completed/Met   Problem: Coping: Goal: Level of anxiety will decrease Outcome: Completed/Met   Problem: Elimination: Goal: Will not experience complications related to bowel motility Outcome: Completed/Met Goal: Will not experience complications related to urinary retention Outcome: Completed/Met   Problem: Pain Managment: Goal: General experience of comfort will improve Outcome: Completed/Met   Problem: Safety: Goal: Ability to remain free from injury will improve Outcome: Completed/Met   Problem: Skin Integrity: Goal: Risk for impaired skin integrity will decrease Outcome: Completed/Met

## 2023-01-12 NOTE — TOC CM/SW Note (Signed)
     [facility] REFERRAL        Occupational Therapy * Physical Therapy * Speech Therapy                           DATE 7/3   PATIENT NAME  Mark Durham  PATIENT MRN MRN: [id number]        DIAGNOSIS/DIAGNOSIS CODE SEPSIS  DATE OF DISCHARGE: 7/3       PRIMARY CARE PHYSICIAN      PCP PHONE/FAX [facility]       Dear Provider (Name: [facility] outpatient __  Fax: [phone]   I certify that I have examined this patient and that occupational/physical/speech therapy is necessary on an outpatient basis.    The patient has expressed interest in completing their recommended course of therapy at your  location.  Once a formal order from the patient's primary care physician has been obtained, please  contact him/her to schedule an appointment for evaluation at your earliest convenience.   Arly.Keller ]  Physical Therapy Evaluate and Treat  [ X ]  Occupational Therapy Evaluate and Treat  [  ]  Speech Therapy Evaluate and Treat         The patient's primary care physician (listed above) must furnish and be responsible for a formal order such that the recommended services may be furnished while under the primary physician's care, and that the plan of care will be established and reviewed every 30 days (or more often if condition necessitates).

## 2023-01-12 NOTE — Progress Notes (Signed)
Patient alert and oriented x 4. Patient is stable  for discharged. Prescriptions to take home. Discharge instructions given to patient and his wife. Indicated understanding. PIV removed. Discharge home with self care.

## 2023-01-12 NOTE — Discharge Summary (Signed)
[facility] Discharge Summary   Patient: Mark Durham ZOX:[id number]  PCP: [facility]  Date of admission: 01/09/2023   Date of discharge:  01/12/2023     Discharge Diagnoses:  Principal Problem:   Severe sepsis [facility]) Active Problems:   UTI (urinary tract infection)   CAP (community acquired pneumonia)   Stroke (HCC)   HTN (hypertension)   HLD (hyperlipidemia)   Hypokalemia   Hypomagnesemia   Hypophosphatemia   Tobacco use   Obesity (BMI 30-39.9)   Admitted From: Home Disposition:  Home   Recommendations for Outpatient Follow-up:  PCP: Follow-up with PCP in 1 week Follow up LABS/TEST:  BMP in 1 wk    Diet recommendation: Cardiac diet  Activity: The patient is advised to gradually reintroduce usual activities, as tolerated  Discharge Condition: stable  Code Status: Full code   History of present illness: As per the H and P dictated on admission Hospital Course:  71 y.o.m w/ history of stroke with mild right-sided weakness, hypertension, hyperlipidemia, GERD, obesity, poststroke epilepsy, who presents with fever, increased urinary frequency. He was recently hospitalized from 6/26 - 6/27 due to TIA, and had a negative MRI.  Patient was discharged on azithromycin for possible bronchitis. He developed fever and chills,increased urinary frequency. He has cough with yellow-colored sputum production In ED WBC 13.8, lactic acid 1.2, 3.1, procal< 0.10, positive urine analysis (cloudy appearance, large amount of leukocyte, many bacteria, WBC> 50), GFR> 60, potassium 2.5, magnesium of 1.5, phosphorus of 1.3, INR 1.3, PTT 33, negative COVID PCR.  Temperature 102, blood pressure 112/67, heart rate 122, RR 25, oxygen saturation 95% on room air.  CXR- Mild left basilar infiltration. He was admitted for further management   Assessment and Plan:  # Severe sepsis POA due to E. coli UTI:  No recurrence of fever, lactic acidosis resolved leukocytosis improved, urine  culture E. coli blood culture no growth, managed w/ iv ceftriaxone.  Switch to Keflex 500 mg p.o. 3 times daily for 5 days on discharge. # Bronchitis, patient has significant cough. Procalcitonin reassuring.  Chest x-ray with mild left basilar infiltration .  Patient completed azithromycin on ceftriaxone as above. S/p incentive spirometry ambulation antitussive # Diarrhea: No abdominal pain or tenderness. negative C. difficile and GI pathogen panel. Cont Imodium.  Diarrhea resolved.  No nausea vomiting, tolerating diet well. # Stroke with mild right-sided weakness, Recent TIA: Stable, continue home aspirin and Lipitor.  Intact neurologically # HTN, BP remains stable holding amlodipine for now. resumed upon discharge # HLD, Cont lipitor # Hypokalemia, Hypomagnesemia, Hypophosphatemia: Mag and Phos improved, potassium persistently low suspect in the setting of decreased oral intake and diarrhea, aggressively replaced.  K 3.3, potassium chloride 40 mEq one-time dose given before discharge.  Diarrhea resolved.  Patient was advised to follow with PCP in 1 wk #Tobacco use, s/p nicotine patch # Class I Obesity:Patient's Body mass index is 31.85 kg/m. : Will benefit with PCP follow-up, weight loss  healthy lifestyle and outpatient sleep evaluation.  Patient was ambulatory without any assistance. On the day of the discharge the patient's vitals were stable, and no other acute medical condition were reported by patient. the patient was felt safe to be discharge at Home.  Consultants: None Procedures: None  Discharge Exam: General: Appear in no distress, no Rash; Oral Mucosa Clear, moist. Cardiovascular: S1 and S2 Present, no Murmur, Respiratory: normal respiratory effort, Bilateral Air entry present and no Crackles, no wheezes Abdomen: Bowel Sound present, Soft and no  tenderness, no hernia Extremities: no Pedal edema, no calf tenderness Neurology: alert and oriented to time, place, and person affect  appropriate.  Filed Weights   01/09/23 0236  Weight: 100.7 kg   Vitals:   01/12/23 0459 01/12/23 0758  BP: 125/82 125/72  Pulse: 85 77  Resp: 18 18  Temp: 98.3 F (36.8 C) 97.8 F (36.6 C)  SpO2: 100% 97%    DISCHARGE MEDICATION: Allergies as of 01/12/2023   No Known Allergies      Medication List     STOP taking these medications    azithromycin 500 MG tablet Commonly known as: Zithromax       TAKE these medications    acetaminophen 500 MG tablet Commonly known as: TYLENOL Take 1,000 mg by mouth every 6 (six) hours as needed for moderate pain (as needed for pain). Take 2 tablets 3x per day as needed for pain   amLODipine 10 MG tablet Commonly known as: NORVASC Take 10 mg by mouth daily.   aspirin EC 81 MG tablet Take 81 mg by mouth daily. Swallow whole.   atorvastatin 80 MG tablet Commonly known as: LIPITOR Take 80 mg by mouth daily. Take 1 tablet by mouth daily   cephALEXin 500 MG capsule Commonly known as: KEFLEX Take 1 capsule (500 mg total) by mouth every 8 (eight) hours for 5 days.   diclofenac Sodium 1 % Gel Commonly known as: VOLTAREN Apply 2 g topically in the morning, at noon, and at bedtime. Apply 2g topically 3x daily as needed for arthritis left hip   Lacosamide 100 MG Tabs Take 200 mg by mouth 2 (two) times daily.   melatonin 3 MG Tabs tablet Take 3 mg by mouth at bedtime.   omeprazole 20 MG tablet Commonly known as: PRILOSEC OTC Take 20 mg by mouth daily. Take 1 tablet by mouth daily as needed.   polyethylene glycol 17 g packet Commonly known as: MIRALAX / GLYCOLAX Take 17 g by mouth daily. Drink by mouth daily as needed.   senna 8.6 MG Tabs tablet Commonly known as: SENOKOT Take 2 tablets (17.2 mg total) by mouth at bedtime as needed for mild constipation. Take 2 tablets by mouth nightly. What changed:  when to take this reasons to take this   tadalafil 20 MG tablet Commonly known as: CIALIS Take 1 tablet (20 mg total) by  mouth daily as needed for erectile dysfunction (1 hour prior to sexual activity).   tamsulosin 0.4 MG Caps capsule Commonly known as: FLOMAX Take 1 capsule (0.4 mg total) by mouth daily after supper.       No Known Allergies Discharge Instructions     Call MD for:  difficulty breathing, headache or visual disturbances   Complete by: As directed    Call MD for:  extreme fatigue   Complete by: As directed    Call MD for:  persistant dizziness or light-headedness   Complete by: As directed    Call MD for:  persistant nausea and vomiting   Complete by: As directed    Call MD for:  severe uncontrolled pain   Complete by: As directed    Call MD for:  temperature >100.4   Complete by: As directed    Diet - low sodium heart healthy   Complete by: As directed    Discharge instructions   Complete by: As directed    Follow-up with PCP in 1 week   Increase activity slowly   Complete by: As directed  The results of significant diagnostics from this hospitalization (including imaging, microbiology, ancillary and laboratory) are listed below for reference.    Significant Diagnostic Studies: DG Chest Port 1 View  Result Date: 01/09/2023 CLINICAL DATA:  Sepsis EXAM: PORTABLE CHEST 1 VIEW COMPARISON:  01/06/2023 FINDINGS: Stable mild left basilar atelectasis or infiltrate. Lung volumes are small, but are symmetric. No pneumothorax or pleural effusion. Cardiac size within normal limits. Pulmonary vascularity is normal. No acute bone abnormality. IMPRESSION: 1. Pulmonary hypoinflation. 2. Stable mild left basilar atelectasis or infiltrate. Electronically Signed   By: Helyn Numbers M.D.   On: 01/09/2023 02:22   ECHOCARDIOGRAM COMPLETE BUBBLE STUDY  Result Date: 01/06/2023    ECHOCARDIOGRAM REPORT   Patient Name:   ALYX MUSKETT Date of Exam: 01/06/2023 Medical Rec #:  [id number]       Height:       70.0 in Accession #:    [id number]      Weight:       216.5 lb Date of Birth:  15-Oct-1950        BSA:          2.159 m Patient Age:    71 years        BP:           151/81 mmHg Patient Gender: M               HR:           86 bpm. Exam Location:  [facility] Procedure: 2D Echo, Cardiac Doppler, Color Doppler and Saline Contrast Bubble            Study Indications:     Stroke 434.91 / I63.9  History:         Patient has no prior history of Echocardiogram examinations.                  Risk Factors:Hypertension.  Sonographer:     Cristela Blue Referring Phys:  [id number] [facility] Diagnosing Phys: Lorine Bears MD IMPRESSIONS  1. Left ventricular ejection fraction, by estimation, is 60 to 65%. The left ventricle has normal function. The left ventricle has no regional wall motion abnormalities. Left ventricular diastolic parameters are consistent with Grade I diastolic dysfunction (impaired relaxation).  2. Right ventricular systolic function is normal. The right ventricular size is normal.  3. The mitral valve is normal in structure. Trivial mitral valve regurgitation. No evidence of mitral stenosis.  4. The aortic valve is normal in structure. Aortic valve regurgitation is not visualized. Aortic valve sclerosis/calcification is present, without any evidence of aortic stenosis.  5. The inferior vena cava is normal in size with greater than 50% respiratory variability, suggesting right atrial pressure of 3 mmHg.  6. Agitated saline contrast bubble study was negative, with no evidence of any interatrial shunt. FINDINGS  Left Ventricle: Left ventricular ejection fraction, by estimation, is 60 to 65%. The left ventricle has normal function. The left ventricle has no regional wall motion abnormalities. The left ventricular internal cavity size was normal in size. There is  no left ventricular hypertrophy. Left ventricular diastolic parameters are consistent with Grade I diastolic dysfunction (impaired relaxation). Right Ventricle: The right ventricular size is normal. No increase in right ventricular wall thickness.  Right ventricular systolic function is normal. Left Atrium: Left atrial size was normal in size. Right Atrium: Right atrial size was normal in size. Pericardium: There is no evidence of pericardial effusion. Mitral Valve: The mitral valve is normal in structure.  Trivial mitral valve regurgitation. No evidence of mitral valve stenosis. MV peak gradient, 3.2 mmHg. The mean mitral valve gradient is 2.0 mmHg. Tricuspid Valve: The tricuspid valve is normal in structure. Tricuspid valve regurgitation is not demonstrated. No evidence of tricuspid stenosis. Aortic Valve: The aortic valve is normal in structure. Aortic valve regurgitation is not visualized. Aortic valve sclerosis/calcification is present, without any evidence of aortic stenosis. Aortic valve mean gradient measures 3.5 mmHg. Aortic valve peak  gradient measures 5.9 mmHg. Aortic valve area, by VTI measures 2.72 cm. Pulmonic Valve: The pulmonic valve was normal in structure. Pulmonic valve regurgitation is not visualized. No evidence of pulmonic stenosis. Aorta: The aortic root is normal in size and structure. Venous: The inferior vena cava is normal in size with greater than 50% respiratory variability, suggesting right atrial pressure of 3 mmHg. IAS/Shunts: No atrial level shunt detected by color flow Doppler. Agitated saline contrast was given intravenously to evaluate for intracardiac shunting. Agitated saline contrast bubble study was negative, with no evidence of any interatrial shunt.  LEFT VENTRICLE PLAX 2D LVIDd:         4.10 cm   Diastology LVIDs:         2.30 cm   LV e' medial:    6.64 cm/s LV PW:         1.10 cm   LV E/e' medial:  9.2 LV IVS:        1.10 cm   LV e' lateral:   6.20 cm/s LVOT diam:     2.00 cm   LV E/e' lateral: 9.8 LV SV:         49 LV SV Index:   23 LVOT Area:     3.14 cm  RIGHT VENTRICLE RV S prime:     14.40 cm/s TAPSE (M-mode): 2.1 cm LEFT ATRIUM             Index        RIGHT ATRIUM          Index LA diam:        3.00 cm 1.39  cm/m   RA Area:     9.59 cm LA Vol (A2C):   22.3 ml 10.33 ml/m  RA Volume:   16.50 ml 7.64 ml/m LA Vol (A4C):   11.6 ml 5.37 ml/m LA Biplane Vol: 16.1 ml 7.46 ml/m  AORTIC VALVE AV Area (Vmax):    2.28 cm AV Area (Vmean):   2.11 cm AV Area (VTI):     2.72 cm AV Vmax:           121.00 cm/s AV Vmean:          84.800 cm/s AV VTI:            0.180 m AV Peak Grad:      5.9 mmHg AV Mean Grad:      3.5 mmHg LVOT Vmax:         87.80 cm/s LVOT Vmean:        57.000 cm/s LVOT VTI:          0.156 m LVOT/AV VTI ratio: 0.86  AORTA Ao Root diam: 3.40 cm MITRAL VALVE               TRICUSPID VALVE MV Area (PHT): 2.87 cm    TR Peak grad:   17.5 mmHg MV Area VTI:   2.25 cm    TR Vmax:        209.00 cm/s MV Peak grad:  3.2  mmHg MV Mean grad:  2.0 mmHg    SHUNTS MV Vmax:       0.90 m/s    Systemic VTI:  0.16 m MV Vmean:      61.5 cm/s   Systemic Diam: 2.00 cm MV Decel Time: 264 msec MV E velocity: 60.80 cm/s MV A velocity: 81.20 cm/s MV E/A ratio:  0.75 Lorine Bears MD Electronically signed by Lorine Bears MD Signature Date/Time: 01/06/2023/4:46:24 PM    Final    DG Chest Port 1 View  Result Date: 01/06/2023 CLINICAL DATA:  Cough. EXAM: PORTABLE CHEST 1 VIEW COMPARISON:  None Available. FINDINGS: Subtle interstitial opacities in the left lung. No confluent consolidation. No visible pleural effusions or pneumothorax per cardiomediastinal silhouette is within normal limits. Polyarticular degenerative change. IMPRESSION: Subtle interstitial opacities in the left lung could represent atelectasis or early pneumonia. No confluent consolidation. Electronically Signed   By: Feliberto Harts M.D.   On: 01/06/2023 11:49   CT VENOGRAM HEAD  Result Date: 01/06/2023 CLINICAL DATA:  72 year old male with TIA. Asymmetric noncontrast MRI signal associated with the right transverse, sigmoid venous sinuses and right IJ bulb and those venous structures not evaluated on CTA the day prior. EXAM: CT VENOGRAM HEAD TECHNIQUE: Venographic  phase images of the brain were obtained following the administration of intravenous contrast. Multiplanar reformats and maximum intensity projections were generated. RADIATION DOSE REDUCTION: This exam was performed according to the departmental dose-optimization program which includes automated exposure control, adjustment of the mA and/or kV according to patient size and/or use of iterative reconstruction technique. CONTRAST:  75mL OMNIPAQUE IOHEXOL 350 MG/ML SOLN COMPARISON:  Noncontrast brain MRI 0018 hours today. CTA head and neck 01/05/2023. FINDINGS: Venous sinuses: Maintained, normal postcontrast opacification of the superior sagittal sinus, torcula, bilateral transverse sinuses, bilateral sigmoid sinuses and bilateral IJ bulbs. Expected enhancement of the straight sinus, vein of Galen, inferior sagittal sinus, internal cerebral veins. Bilateral cavernous sinus enhancement is maintained. And other major cerebral draining cortical veins appear to be enhancing and patent. Anatomic variants: None significant. Other findings: Visible major arterial structures demonstrate continued enhancement. Stable background CT appearance of brain parenchyma since yesterday. Review of the MIP images confirms the above findings IMPRESSION: Normal intracranial CT Venogram. Negative for venous sinus thrombosis. Electronically Signed   By: Odessa Fleming M.D.   On: 01/06/2023 09:29   MR BRAIN WO CONTRAST  Result Date: 01/06/2023 CLINICAL DATA:  Initial evaluation for TIA. EXAM: MRI HEAD WITHOUT CONTRAST TECHNIQUE: Multiplanar, multiecho pulse sequences of the brain and surrounding structures were obtained without intravenous contrast. COMPARISON:  Prior studies from 01/05/2023. FINDINGS: Brain: Diffuse prominence of the CSF containing spaces compatible with generalized cerebral atrophy. Patchy T2/FLAIR hyperintensity involving the periventricular and deep white matter both cerebral hemispheres as well as the pons, consistent with  chronic small vessel ischemic disease, moderately advanced in nature. Small remote cortical infarct noted involving the left parietal lobe. No evidence for acute or subacute ischemia. Gray-white matter differentiation maintained. No acute or chronic intracranial blood products. No mass lesion or midline shift. Diffuse ventricular prominence, most like related to global parenchymal volume loss. No hydrocephalus. No extra-axial fluid collection. 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 Suprasellar region normal. Vascular: Major intracranial arterial vascular flow voids are maintained. There is asymmetric T2/FLAIR signal intensity involving the right transverse and sigmoid sinuses as well as the right jugular bulb. While this finding is favored to reflect  slow/sluggish flow, possible dural sinus thrombosis is difficult to exclude on this exam. Skull and upper cervical spine: Degenerative osteoarthritic changes noted about the C1-2 articulation. Bone marrow signal intensity normal. No scalp soft tissue abnormality. Sinuses/Orbits: Prior ocular lens replacement with axial myopia on the right. No acute finding. Paranasal sinuses are clear. No significant mastoid effusion. Other: None. IMPRESSION: 1. No acute intracranial infarct. 2. Asymmetric T2/FLAIR signal intensity involving the right transverse and sigmoid sinuses as well as the right jugular bulb. While this finding is favored to reflect slow/sluggish flow, possible dural sinus thrombosis is difficult to exclude on this exam. Correlation with dedicated MRV, with and without contrast, suggested for further evaluation as clinically warranted. 3. Age-related cerebral atrophy with moderately advanced chronic microvascular ischemic disease. Small remote left parietal infarct. 4. 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 Correlation with pituitary function tests suggested. Additionally, further evaluation with nonemergent pituitary protocol MRI could be performed for further evaluation as warranted. Electronically Signed   By: Rise Mu M.D.   On: 01/06/2023 01:07   CT ANGIO HEAD NECK W WO CM  Result Date: 01/05/2023 CLINICAL DATA:  Initial evaluation for neuro deficit, slurred speech. EXAM: CT ANGIOGRAPHY HEAD AND NECK WITH AND WITHOUT CONTRAST TECHNIQUE: Multidetector CT imaging of the head and neck was performed using the standard protocol during bolus administration of intravenous contrast. Multiplanar CT image reconstructions and MIPs were obtained to evaluate the vascular anatomy. Carotid stenosis measurements (when applicable) are obtained utilizing NASCET criteria, using the distal internal carotid diameter as the denominator. RADIATION DOSE REDUCTION: This exam was performed according to the departmental dose-optimization program which includes automated exposure control, adjustment of the mA and/or kV according to patient size and/or use of iterative reconstruction technique. CONTRAST:  75mL OMNIPAQUE IOHEXOL 350 MG/ML SOLN COMPARISON:  CT from earlier the same day as well as prior exam from 07/31/2020. FINDINGS: CTA NECK FINDINGS Aortic arch: Partially visualized aortic arch normal. Bovine branching pattern noted. Mild atheromatous change about the arch itself. No stenosis about the origin the great vessels. Right carotid system: Right common and internal carotid arteries are patent without dissection. Mild atheromatous irregularity about the right carotid bulb without significant stenosis. 4 mm outpouching extending from the proximal cervical right ICA, likely a small ulcerative plaque, similar to prior (series 5, image 406), similar prior. Left carotid system: Left common and internal carotid arteries are patent without dissection. Mild atheromatous change about the left carotid bulb without  hemodynamically significant stenosis. Vertebral arteries: Both vertebral arteries arise from subclavian arteries. No proximal subclavian artery stenosis. Right vertebral artery dominant. Vertebral arteries are patent without significant stenosis or dissection. Skeleton: No discrete or worrisome osseous lesions. Moderate multilevel cervical spondylosis. Other neck: Mildly prominent left level 3 lymph nodes measure up to 1.6 cm (series 4, image 67), indeterminate. There is an apparent polypoid density at the posterior left hypopharynx at the level of the epiglottis (series 4, image 70). While this finding could reflect a small amount of retained secretions or retention cyst, a possible small soft tissue lesion is difficult to exclude. This was present on prior. Upper chest: Emphysema. Partially calcified 8 mm nodule at the left upper lobe is stable as compared to 2022, consistent with a benign finding. Review of the MIP images confirms the above findings CTA HEAD FINDINGS Anterior circulation: Atheromatous change about the carotid siphons bilaterally. Associated mild to moderate narrowing at the para  clinoid right ICA. There is a focal severe stenosis at the contralateral left carotid siphon (series 7, image 118). A1 segments patent bilaterally. Left A1 hypoplastic. Normal anterior communicating complex. Anterior cerebral arteries patent without stenosis. M1 segments mildly irregular but patent without significant stenosis or occlusion. Severe proximal right M2 stenosis noted (series 9, image 22). Overall, the right MCA branches are patent but attenuated as compared to the left, similar to prior. Posterior circulation: Dominant right V4 segment patent without stenosis. Left V4 segment mildly attenuated. Severe diffuse narrowing of the distal left V4 segment just prior to the vertebrobasilar junction (series 5, image 254). Basilar patent without stenosis. Superior cerebral arteries patent bilaterally. Both PCAs  primarily supplied via the basilar. Atheromatous irregularity about the PCAs which remain patent to their distal aspects. Venous sinuses: Not well assessed due to arterial timing the contrast bolus. Anatomic variants: As above.  No aneurysm. Review of the MIP images confirms the above findings IMPRESSION: 1. Negative CTA for large vessel occlusion or other emergent finding. 2. Intracranial atherosclerotic disease, with most notable findings including severe proximal right M2 and distal left V4 stenoses, with additional severe stenosis at the cavernous left ICA. Right MCA branches are overall attenuated as compared to the left but remain patent. 3. 4 mm outpouching extending from the proximal cervical right ICA, likely a small ulcerative plaque, similar to prior. 4. Polypoid density at the posterior left hypopharynx at the level of the epiglottis, indeterminate. While this finding could reflect a small amount of retained secretions or retention cyst, a possible small soft tissue lesion is difficult to exclude. Correlation with direct visualization recommended. 5. Mildly prominent left level 3 lymph nodes measure up to 1.6 cm, indeterminate. Aortic Atherosclerosis (ICD10-I70.0) and Emphysema (ICD10-J43.9). Electronically Signed   By: Rise Mu M.D.   On: 01/05/2023 22:23   CT HEAD CODE STROKE WO CONTRAST  Result Date: 01/05/2023 CLINICAL DATA:  Code stroke. Initial evaluation for neuro deficit, stroke. Left-sided deficits. EXAM: CT HEAD WITHOUT CONTRAST TECHNIQUE: Contiguous axial images were obtained from the base of the skull through the vertex without intravenous contrast. RADIATION DOSE REDUCTION: This exam was performed according to the departmental dose-optimization program which includes automated exposure control, adjustment of the mA and/or kV according to patient size and/or use of iterative reconstruction technique. COMPARISON:  Prior study from 07/31/2020. FINDINGS: Brain: Age-related  cerebral atrophy with chronic small vessel ischemic disease. No acute intracranial hemorrhage. No acute large vessel territory infarct. No mass lesion, midline shift or mass effect. Diffuse ventricular prominence, somewhat out of proportion to cortical sulcation. While this finding could be related atrophy, a degree of NPH could be contributory. No extra-axial fluid collection. Vascular: No convincing abnormal hyperdense vessel. Scattered vascular calcifications noted within the carotid siphons. Skull: Scalp soft tissues and calvarium demonstrate no acute finding. Sinuses/Orbits: Prior ocular lens replacement on the right. Globes orbital soft tissues demonstrate no acute finding. Paranasal sinuses are largely clear. No mastoid effusion. Other: None. ASPECTS [facility] Stroke Program Early CT Score) - Ganglionic level infarction (caudate, lentiform nuclei, internal capsule, insula, M1-M3 cortex): 7 - Supraganglionic infarction (M4-M6 cortex): 3 Total score (0-10 with 10 being normal): 10 IMPRESSION: 1. No acute intracranial abnormality. 2. ASPECTS is 10. 3. Age-related cerebral atrophy with chronic small vessel ischemic disease. 4. Diffuse ventricular prominence somewhat out of proportion to cortical sulcation. While this finding could be related underlying atrophy, a degree of NPH could be contributory, and could be considered in the correct clinical setting. Critical  Value/emergent results were called by telephone at the time of interpretation on 01/05/2023 at 8:11 pm to provider [facility] , who verbally acknowledged these results. Electronically Signed   By: Rise Mu M.D.   On: 01/05/2023 20:16    Microbiology: Recent Results (from the past 240 hour(s))  Blood Culture (routine x 2)     Status: None (Preliminary result)   Collection Time: 01/09/23  2:09 AM   Specimen: BLOOD LEFT ARM  Result Value Ref Range Status   Specimen Description BLOOD LEFT ARM  Final   Special Requests   Final     BOTTLES DRAWN AEROBIC AND ANAEROBIC Blood Culture results may not be optimal due to an inadequate volume of blood received in culture bottles   Culture   Final    NO GROWTH 3 DAYS Performed at [facility], [address] [place], [place] [address]    Report Status PENDING  Incomplete  SARS Coronavirus 2 by RT PCR (hospital order, performed in [facility] hospital lab) *cepheid single result test* Anterior Nasal Swab     Status: None   Collection Time: 01/09/23  2:11 AM   Specimen: Anterior Nasal Swab  Result Value Ref Range Status   SARS Coronavirus 2 by RT PCR NEGATIVE NEGATIVE Final    Comment: (NOTE) SARS-CoV-2 target nucleic acids are NOT DETECTED.  The SARS-CoV-2 RNA is generally detectable in upper and lower respiratory specimens during the acute phase of infection. The lowest concentration of SARS-CoV-2 viral copies this assay can detect is 250 copies / mL. A negative result does not preclude SARS-CoV-2 infection and should not be used as the sole basis for treatment or other patient management decisions.  A negative result may occur with improper specimen collection / handling, submission of specimen other than nasopharyngeal swab, presence of viral mutation(s) within the areas targeted by this assay, and inadequate number of viral copies (<250 copies / mL). A negative result must be combined with clinical observations, patient history, and epidemiological information.  Fact Sheet for Patients:   RoadLapTop.co.za  Fact Sheet for Healthcare Providers: http://kim-miller.com/  This test is not yet approved or  cleared by the [place] FDA and has been authorized for detection and/or diagnosis of SARS-CoV-2 by FDA under an Emergency Use Authorization (EUA).  This EUA will remain in effect (meaning this test can be used) for the duration of the COVID-19 declaration under Section 564(b)(1) of the Act, 21 U.S.C. section  360bbb-3(b)(1), unless the authorization is terminated or revoked sooner.  Performed at [facility], [address] [place], [place] [address]   Urine Culture (for pregnant, neutropenic or urologic patients or patients with an indwelling urinary catheter)     Status: Abnormal   Collection Time: 01/09/23  3:35 AM   Specimen: Urine, Clean Catch  Result Value Ref Range Status   Specimen Description   Final    URINE, CLEAN CATCH Performed at [facility], [address], [place], [place] [address]    Special Requests   Final    NONE Performed at [facility], [address] [place], [place] [address]    Culture 60,000 COLONIES/mL ESCHERICHIA COLI (A)  Final   Report Status 01/11/2023 FINAL  Final   Organism ID, Bacteria ESCHERICHIA COLI (A)  Final      Susceptibility   Escherichia coli - MIC*    AMPICILLIN >=32 RESISTANT Resistant     CEFAZOLIN <=4 SENSITIVE Sensitive     CEFEPIME <=0.12 SENSITIVE  Sensitive     CEFTRIAXONE <=0.25 SENSITIVE Sensitive     CIPROFLOXACIN <=0.25 SENSITIVE Sensitive     GENTAMICIN <=1 SENSITIVE Sensitive     IMIPENEM <=0.25 SENSITIVE Sensitive     NITROFURANTOIN <=16 SENSITIVE Sensitive     TRIMETH/SULFA <=20 SENSITIVE Sensitive     AMPICILLIN/SULBACTAM 16 INTERMEDIATE Intermediate     PIP/TAZO <=4 SENSITIVE Sensitive     * 60,000 COLONIES/mL ESCHERICHIA COLI  Blood Culture (routine x 2)     Status: None (Preliminary result)   Collection Time: 01/09/23  6:30 AM   Specimen: BLOOD  Result Value Ref Range Status   Specimen Description BLOOD BLOOD RIGHT HAND  Final   Special Requests   Final    BOTTLES DRAWN AEROBIC AND ANAEROBIC Blood Culture adequate volume   Culture   Final    NO GROWTH 3 DAYS Performed at [facility], [address], [place], [place] [address]    Report Status PENDING  Incomplete  Expectorated Sputum Assessment w Gram Stain, Rflx to Resp Cult     Status: None   Collection Time:  01/09/23  3:00 PM   Specimen: Sputum  Result Value Ref Range Status   Specimen Description SPUTUM  Final   Special Requests NONE  Final   Sputum evaluation   Final    Sputum specimen not acceptable for testing.  Please recollect.   C/MORGAN CARDWELL AT 1553 01/09/23.PMF Performed at [facility], [address] [place], [place] [address]    Report Status 01/09/2023 FINAL  Final  C Difficile Quick Screen w PCR reflex     Status: None   Collection Time: 01/10/23  1:30 PM   Specimen: STOOL  Result Value Ref Range Status   C Diff antigen NEGATIVE NEGATIVE Final   C Diff toxin NEGATIVE NEGATIVE Final   C Diff interpretation No C. difficile detected.  Final    Comment: Performed at [facility], [address] [place], [place] [address]  Gastrointestinal Panel by PCR , Stool     Status: None   Collection Time: 01/10/23  1:30 PM   Specimen: Stool  Result Value Ref Range Status   Campylobacter species NOT DETECTED NOT DETECTED Final   Plesimonas shigelloides NOT DETECTED NOT DETECTED Final   Salmonella species NOT DETECTED NOT DETECTED Final   Yersinia enterocolitica NOT DETECTED NOT DETECTED Final   Vibrio species NOT DETECTED NOT DETECTED Final   Vibrio cholerae NOT DETECTED NOT DETECTED Final   Enteroaggregative E coli (EAEC) NOT DETECTED NOT DETECTED Final   Enteropathogenic E coli (EPEC) NOT DETECTED NOT DETECTED Final   Enterotoxigenic E coli (ETEC) NOT DETECTED NOT DETECTED Final   Shiga like toxin producing E coli (STEC) NOT DETECTED NOT DETECTED Final   Shigella/Enteroinvasive E coli (EIEC) NOT DETECTED NOT DETECTED Final   Cryptosporidium NOT DETECTED NOT DETECTED Final   Cyclospora cayetanensis NOT DETECTED NOT DETECTED Final   Entamoeba histolytica NOT DETECTED NOT DETECTED Final   Giardia lamblia NOT DETECTED NOT DETECTED Final   Adenovirus F40/41 NOT DETECTED NOT DETECTED Final   Astrovirus NOT DETECTED NOT DETECTED Final   Norovirus GI/GII NOT  DETECTED NOT DETECTED Final   Rotavirus A NOT DETECTED NOT DETECTED Final   Sapovirus (I, II, IV, and V) NOT DETECTED NOT DETECTED Final    Comment: Performed at [facility], [address], [place], [place] [address]     Labs: CBC: Recent Labs  Lab 01/05/23 2005 01/06/23 0519 01/09/23 0209 01/10/23  1610 01/11/23 0455  WBC 7.9 7.6 13.8* 10.8* 7.0  NEUTROABS 4.2  --  11.8*  --   --   HGB 14.6 13.8 12.7* 11.0* 12.0*  HCT 46.6 43.6 39.6 34.2* 38.3*  MCV 81.8 81.3 81.1 80.7 81.1  PLT 153 244 235 205 232   Basic Metabolic Panel: Recent Labs  Lab 01/09/23 0209 01/10/23 0524 01/11/23 0455 01/11/23 1251 01/12/23 0521  NA 133* 138 137 135 136  K 2.5* 3.0* 2.9* 3.8 3.3*  CL 103 106 106 105 108  CO2 19* 23 23 22  21*  GLUCOSE 143* 106* 110* 95 97  BUN 20 12 10 10 9   CREATININE 1.07 0.79 0.82 0.75 0.76  CALCIUM 8.4* 8.4* 9.0 9.0 8.8*  MG 1.5* 1.7  --   --  1.7  PHOS 1.3* 2.2*  --   --  3.4   Liver Function Tests: Recent Labs  Lab 01/05/23 2005 01/09/23 0209  AST 17 16  ALT 18 13  ALKPHOS 129* 86  BILITOT 0.9 1.5*  PROT 8.8* 7.5  ALBUMIN 4.4 3.6   No results for input(s): "LIPASE", "AMYLASE" in the last 168 hours. No results for input(s): "AMMONIA" in the last 168 hours. Cardiac Enzymes: No results for input(s): "CKTOTAL", "CKMB", "CKMBINDEX", "TROPONINI" in the last 168 hours. BNP (last 3 results) No results for input(s): "BNP" in the last 8760 hours. CBG: Recent Labs  Lab 01/05/23 1952  GLUCAP 134*    Time spent: 35 minutes  Signed:  Gillis Santa  [facility] 01/12/2023 2:05 PM

## 2023-01-14 LAB — CULTURE, BLOOD (ROUTINE X 2)

## 2023-01-17 ENCOUNTER — Encounter: Payer: BC Managed Care – PPO | Admitting: Speech Pathology

## 2023-01-17 ENCOUNTER — Ambulatory Visit: Payer: BC Managed Care – PPO | Attending: *Deleted | Admitting: Physical Therapy

## 2023-01-17 ENCOUNTER — Ambulatory Visit: Payer: BC Managed Care – PPO | Admitting: Occupational Therapy

## 2023-01-17 DIAGNOSIS — R278 Other lack of coordination: Secondary | ICD-10-CM

## 2023-01-17 DIAGNOSIS — I69398 Other sequelae of cerebral infarction: Secondary | ICD-10-CM | POA: Insufficient documentation

## 2023-01-17 DIAGNOSIS — H539 Unspecified visual disturbance: Secondary | ICD-10-CM

## 2023-01-17 DIAGNOSIS — M6281 Muscle weakness (generalized): Secondary | ICD-10-CM | POA: Diagnosis present

## 2023-01-17 DIAGNOSIS — R262 Difficulty in walking, not elsewhere classified: Secondary | ICD-10-CM | POA: Insufficient documentation

## 2023-01-17 DIAGNOSIS — R2681 Unsteadiness on feet: Secondary | ICD-10-CM | POA: Insufficient documentation

## 2023-01-17 NOTE — Therapy (Signed)
OUTPATIENT PHYSICAL THERAPY NEURO TREATMENT/  Re-certification      Patient Name: Mark Durham MRN: [id number] DOB:10/26/1950, 72 y.o., male Today's Date: 01/17/2023   PCP: [facility] REFERRING PROVIDER: Hope Pigeon MD   END OF SESSION:  PT End of Session - 01/17/23 1019     Visit Number 17    Number of Visits 41    Date for PT Re-Evaluation 04/11/23    Authorization Type BCBS COMM PRO    Authorization Time Period 11/03/22-01/26/23    PT Start Time 1018    PT Stop Time 1100    PT Time Calculation (min) 42 min    Equipment Utilized During Treatment Gait belt    Activity Tolerance Patient tolerated treatment well    Behavior During Therapy WFL for tasks assessed/performed              Past Medical History:  Diagnosis Date   Hypertension    Past Surgical History:  Procedure Laterality Date   TOTAL HIP ARTHROPLASTY Right    Patient Active Problem List   Diagnosis Date Noted   Sepsis due to pneumonia (HCC) 01/09/2023   Severe sepsis (HCC) 01/09/2023   UTI (urinary tract infection) 01/09/2023   Stroke (HCC) 01/09/2023   HTN (hypertension) 01/09/2023   HLD (hyperlipidemia) 01/09/2023   Hypokalemia 01/09/2023   Tobacco use 01/09/2023   Obesity (BMI 30-39.9) 01/09/2023   CAP (community acquired pneumonia) 01/09/2023   Hypomagnesemia 01/09/2023   Hypophosphatemia 01/09/2023   TIA (transient ischemic attack) 01/05/2023   GERD (gastroesophageal reflux disease) 02/26/2021   S/P hip replacement, right 03/04/2020   Primary osteoarthritis of right hip 01/24/2020   Chronic pain of right knee 11/23/2019   Chronic right hip pain 11/23/2019   Osteoarthritis of knee 11/13/2018   History of adenomatous polyp of colon 02/14/2018   History of Barrett's esophagus 02/14/2018    ONSET DATE: January 2024  REFERRING DIAG: weakness due to stroke   THERAPY DIAG:  Other lack of coordination  Difficulty in walking, not elsewhere classified  Muscle  weakness (generalized)  Unsteadiness on feet  Vision disturbance following CVA (cerebrovascular accident)  Rationale for Evaluation and Treatment: Rehabilitation  SUBJECTIVE:                                                                                                                                                                                             SUBJECTIVE STATEMENT:  Pt reports doing well today. Pt reports that he was in the hospital for TIA, UTI, and then Pneumonia last Week. Pt arrives to PT to new PT orders given recent hospitalization. Pt  has been on antibiotics for UTI and Pneumonia and will be completing course today.   Pt accompanied by: significant other  PERTINENT HISTORY: Patient presents to physical therapy for weakness s/p stroke . 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 Per documentation he had a secondary stroke during the hospital stay as well as three seizures. Patient was admitted to [facility] Acute inpatient rehabilitation from 08/10/22-08/31/22 where he was discharged home. Patient was discharged from Home health on 10/28/22 (PT, OT, and ST). PMH includes GERD, OA, R knee/hip pain, Barrett's esophagus. Is using a walker, still drags right foot. Needs assistance for transfers but can bath and dress self.   PAIN:  Are you having pain? No  PRECAUTIONS: Fall  WEIGHT BEARING RESTRICTIONS: No  FALLS: Has patient fallen in last 6 months? No    PATIENT GOALS: walking with less AD, get back to mowing the yard, gardening, grilling     OBJECTIVE:   TODAY'S TREATMENT: DATE: 01/17/23   Pt performed 5 time sit<>stand (5xSTS): 18.65 sec with UE support pushing from chair. 18.12 sec without UE assist.   (>15 sec indicates increased fall risk)   10 Meter Walk Test: Patient instructed to walk 10 meters (32.8 ft) as quickly and as safely as possible at their normal speed x2 and at a fast speed x2. Time measured from 2 meter  mark to 8 meter mark to accommodate ramp-up and ramp-down.  Normal speed 1 with RW: 15.7sec 0.64 m/s Normal speed 2 with RW : 15.88sec 0.66m/s Average Normal speed: 0.635 m/s Fast speed 1: 14.17sec 0.42m/s Fast speed 2: 14.6sec  0.70m/s Average Fast speed: 0.69  m/s Cut off scores: <0.4 m/s = household Ambulator, 0.4-0.8 m/s = limited community Ambulator, >0.8 m/s = community Ambulator, >1.2 m/s = crossing a street, <1.0 = increased fall risk MCID 0.05 m/s (small), 0.13 m/s (moderate), 0.06 m/s (significant)  (ANPTA Core Set of Outcome Measures for Adults with Neurologic Conditions, 2018)  Patient demonstrates increased fall risk as noted by score of   42/56 on Berg Balance Scale.  (<36= high risk for falls, close to 100%; 37-45 significant >80%; 46-51 moderate >50%; 52-55 lower >25%)    PATIENT EDUCATION: Education details: goals, POC, HEP Pt educated throughout session about proper posture and technique with exercises. Improved exercise technique, movement at target joints, use of target muscles after min to mod verbal, visual, tactile cues.  Person educated: Patient and Spouse Education method: Explanation, Demonstration, Tactile cues, and Verbal cues Education comprehension: verbalized understanding, returned demonstration, verbal cues required, and tactile cues required  HOME EXERCISE PROGRAM:  Access Code: [email or web address] URL: [email or web address] Date: 12/23/2022 Prepared by: Grier Rocher  Exercises - Standing Tandem Balance with Counter Support  - 1 x daily - 5 x weekly - 3 sets - 4 reps - 10 hold - Standing Single Leg Stance with Counter Support  - 1 x daily - 5 x weekly - 3 sets - 4 reps - 10 hold  Access Code: [email or web address] URL: [email or web address] Date: 11/03/2022 Prepared by: Precious Bard  Exercises - Seated March  - 1 x daily - 7 x weekly - 2 sets - 10 reps - 5 hold - Seated Long Arc Quad  - 1 x daily - 7 x weekly - 2 sets - 10 reps - 5 hold -  Seated Heel Toe Raises  - 1 x daily - 7 x weekly - 2 sets - 10 reps - 5 hold - Seated Hip Abduction  -  1 x daily - 7 x weekly - 2 sets - 10 reps - 5 hold - Seated Isometric Hip Adduction with Ball  - 1 x daily - 7 x weekly - 2 sets - 10 reps - 5 hold  GOALS: Goals reviewed with patient? Yes  SHORT TERM GOALS: Target date: 01/17/2023  Patient will be independent in home exercise program to improve strength/mobility for better functional independence with ADLs. Baseline:4/24: HEP given  Goal status: IN PROGRESS    LONG TERM GOALS: Target date: 01/26/2023  Patient will increase FOTO score to equal to or greater than  56 to demonstrate statistically significant improvement in mobility and quality of life.   Baseline: 40 6/3: 41 7/8:54 Goal status: REVISED  2.  Patient (> 78 years old) will complete five times sit to stand test in < 15 seconds indicating an increased LE strength and improved balance. Baseline: 4/24: 38.37 with BUE support 6/3: 12.02 sec with UE support 7/8: 18.12 sec without UE assist Goal status: IN PROGRESS  3.  Patient will increase 10 meter walk test to >1.48m/s as to improve gait speed for better community ambulation and to reduce fall risk. Baseline: 4/24: 19.2 seconds with RW; dragging of R foot  6/3: 17.175 with no AD and CGA from PT.  7/8: 14.6 sec without AD (0.17m/s)  Goal status: IN PROGRESS  4.  Patient will increase Berg Balance score by > 45points to demonstrate decreased fall risk during functional activities. Baseline: 4/24: see next session 5/2: 30/56 6/3: 31/56 7/8 42/56  Goal status: REVISED In progress    5.  Patient will increase 2 min walk test by >66ft as to demonstrate reduced fall risk and improved dynamic gait balance for better safety with community/home ambulation.   Baseline: 201 Goal status: INITIAL     ASSESSMENT:  CLINICAL IMPRESSION:  Patient appeared ready for treatment on this day. Pt had recent hospital admissions  for TIA, UTI and PNA. Pt was d/c'ed with new orders for OPPT. PT session focused on re-assessment of LTG given recent hospital admission. Pt demonstrates mild improvement in balance and gait with increased gait speed to 0.33m/s, improved Berg balance Scale scare to 42/56, and ability to  complete 2 min walk test at 268ft. Pt does continue to demonstrate increased fall risk from given gait speed, distance on 2 min walk test, 5xSTS, and Berg balance Scale score, despite improvements from  initial assessment. Patient's condition has the potential to improve in response to therapy. Maximum improvement is yet to be obtained. The anticipated improvement is attainable and reasonable in a generally predictable time. Pt will continue to benefit from skilled therapy to address remaining deficits in order to improve overall QoL and return to PLOF.      OBJECTIVE IMPAIRMENTS: Abnormal gait, cardiopulmonary status limiting activity, decreased activity tolerance, decreased balance, decreased cognition, decreased coordination, decreased endurance, decreased knowledge of condition, decreased knowledge of use of DME, decreased mobility, difficulty walking, decreased strength, decreased safety awareness, impaired perceived functional ability, impaired flexibility, impaired UE functional use, impaired vision/preception, improper body mechanics, and postural dysfunction.   ACTIVITY LIMITATIONS: carrying, lifting, bending, sitting, standing, squatting, sleeping, stairs, transfers, bed mobility, bathing, toileting, reach over head, hygiene/grooming, locomotion level, and caring for others  PARTICIPATION LIMITATIONS: meal prep, cleaning, laundry, medication management, personal finances, interpersonal relationship, driving, shopping, community activity, and yard work  PERSONAL FACTORS: Age, Past/current experiences, Time since onset of injury/illness/exacerbation, Transportation, and 3+ comorbidities: GERD, OA, R knee/hip pain,  Barrett's esophagus  are also affecting patient's functional outcome.   REHAB POTENTIAL: Good  CLINICAL DECISION MAKING: Evolving/moderate complexity  EVALUATION COMPLEXITY: Moderate  PLAN:  PT FREQUENCY: 2x/week  PT DURATION: 12 weeks  PLANNED INTERVENTIONS: Therapeutic exercises, Therapeutic activity, Neuromuscular re-education, Balance training, Gait training, Patient/Family education, Self Care, Joint mobilization, Joint manipulation, Stair training, Vestibular training, Canalith repositioning, Visual/preceptual remediation/compensation, Orthotic/Fit training, Electrical stimulation, Wheelchair mobility training, Spinal mobilization, Cryotherapy, Moist heat, Splintting, Taping, Ultrasound, Ionotophoresis 4mg /ml Dexamethasone, Manual therapy, and Re-evaluation  PLAN FOR NEXT SESSION:   Dynamic balance training endurance training Visual scanning tasks.  Variable gait training.   Grier Rocher PT, DPT  Physical Therapist - [facility]  11:06 AM 01/17/23

## 2023-01-17 NOTE — Therapy (Addendum)
OUTPATIENT OCCUPATIONAL THERAPY NEURO REEVALUATION NOTE  Patient Name: Mark Durham MRN: [id number] DOB:Nov 17, 1950, 72 y.o., male Today's Date: 01/17/2023  PCP: [facility] REFERRING PROVIDER: Hope Pigeon, MD  END OF SESSION:  OT End of Session - 01/17/23 1148     Visit Number 9    Number of Visits 24    Date for OT Re-Evaluation 04/11/23    OT Start Time 1100    OT Stop Time 1145    OT Time Calculation (min) 45 min    Equipment Utilized During Treatment transport chair    Activity Tolerance Patient tolerated treatment well    Behavior During Therapy [facility] for tasks assessed/performed              Past Medical History:  Diagnosis Date   Hypertension    Past Surgical History:  Procedure Laterality Date   TOTAL HIP ARTHROPLASTY Right    Patient Active Problem List   Diagnosis Date Noted   Sepsis due to pneumonia (HCC) 01/09/2023   Severe sepsis (HCC) 01/09/2023   UTI (urinary tract infection) 01/09/2023   Stroke (HCC) 01/09/2023   HTN (hypertension) 01/09/2023   HLD (hyperlipidemia) 01/09/2023   Hypokalemia 01/09/2023   Tobacco use 01/09/2023   Obesity (BMI 30-39.9) 01/09/2023   CAP (community acquired pneumonia) 01/09/2023   Hypomagnesemia 01/09/2023   Hypophosphatemia 01/09/2023   TIA (transient ischemic attack) 01/05/2023   GERD (gastroesophageal reflux disease) 02/26/2021   S/P hip replacement, right 03/04/2020   Primary osteoarthritis of right hip 01/24/2020   Chronic pain of right knee 11/23/2019   Chronic right hip pain 11/23/2019   Osteoarthritis of knee 11/13/2018   History of adenomatous polyp of colon 02/14/2018   History of Barrett's esophagus 02/14/2018   ONSET DATE:  07/25/2022  REFERRING DIAG:  CVA  THERAPY DIAG:  Muscle weakness (generalized)  Other lack of coordination  Visual disturbance following cerebrovascular accident (CVA)  Rationale for Evaluation and Treatment: Rehabilitation  SUBJECTIVE:  SUBJECTIVE  STATEMENT: Pt. reported that he is feeling better after be hospitalized recently. Pt. reported he has not noticed any significant changes after being discharged from the hospital.   Accompanied by: spouse  PERTINENT HISTORY:   01/17/2023 Addendum:  Pt. had 2 recent ED visits with hospitalizations. On 01/05/23 Pt. was hospitalized due sudden headache, slurred speech, and right sided facial drop. CT scan of the head showed no acute intracranial abnormality however shoes chronic small vessel ischemic disease. CT angio of the head did not show any LVO or other emergent finding. 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 MRI resulted in no acute finding however showed "Asymmetric T2/FLAIR signal intensity involving the right transverse and sigmoid sinuses as well as the right jugular bulb". Pt. was hospitalized again 01/09/2023 due to severe sepsis and was discharged 01/12/2023.  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 Per chart review, Pt. had a secondary stroke during the hospital stay as well as three seizures. Patient was admitted to [facility] Acute inpatient rehabilitation from 08/10/22-08/31/22 where he was discharged home. Patient was discharged from Home health on 10/28/22 (PT, OT, and ST). PMH includes: GERD, OA, R knee/hip pain, Barrett's esophagus. Pt. Had recent ED visit on 01/05/23 due sudden headache, slurred speech, and right sided facial drop. CT scan of the head showed no acute intracranial abnormality however shoes chronic small vessel ischemic disease.  CT angio of the head did not show any LVO or other emergent finding. 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 MRI resulted in no acute finding however  showed "Asymmetric T2/FLAIR signal intensity involving the right transverse and sigmoid sinuses as well as the right jugular bulb". Pt. Was hospitalized 01/09/2023 due to severe sepsis and was discharged 01/12/2023.  PRECAUTIONS: None  WEIGHT BEARING RESTRICTIONS: No  PAIN:  Are you having pain? No  FALLS: Has patient fallen in last 6 months? No  LIVING ENVIRONMENT: Lives with: lives with their spouse Lives in: House/apartment Stairs: 5 steps to enter; one level home Has following equipment at home: Environmental consultant - 2 wheeled, Wheelchair (manual), shower chair, and bed side commode  PLOF: Independent  PATIENT GOALS: To get to walking  OBJECTIVE:   HAND DOMINANCE: Right  ADLs: Transfers/ambulation related to ADLs: Eating: Independent with utensils, assist with cutting food.  Grooming: Independent oral care, wife assists with shaving. UB Dressing: Independent LB Dressing: independent Toileting: Independent Bathing: Once in the shower Supervision with bathing Tub Shower transfers: CGA with showere chair  IADLs: Shopping: Wife performs Light housekeeping: Wife performs most IADLs Meal Prep:  Meal preparation Community mobility: Relies on family and friends for driving. Medication management: Wife sets up, and provides medication Financial management: Wife completes Handwriting:  Pt. Wife reports Difficulty with identifying letters, and numbers   MOBILITY STATUS: Uses a rolling walker  POSTURE COMMENTS:  No Significant postural limitations  ACTIVITY TOLERANCE: Activity tolerance:  Fair  FUNCTIONAL OUTCOME MEASURES: FOTO: 53  TR score: 55 01/17/23: FOTO: 61 TR score: 55  UPPER EXTREMITY ROM:    Active ROM Right Eval [facility] Left Eval [facility] Right 01/17/2023 [facility] R 01/17/2023 [facility]  Shoulder flexion      Shoulder abduction      Shoulder adduction      Shoulder extension      Shoulder internal rotation      Shoulder external rotation      Elbow flexion      Elbow extension       Wrist flexion      Wrist extension      Wrist ulnar deviation      Wrist radial deviation      Wrist pronation      Wrist supination      (Blank rows = not tested)  UPPER EXTREMITY MMT:     MMT Right Eval 4/5 Left Eval 4+/5 Right 01/17/23 Left 01/17/23  Shoulder flexion 4-/5 4+/5 4+/5 4+/5  Shoulder abduction  l 4/5 4+/5  Shoulder adduction      Shoulder extension      Shoulder internal rotation      Shoulder external rotation      Middle trapezius      Lower trapezius      Elbow flexion 4+/5 5/5 4+/5 5/5  Elbow extension 4+/5 5/5 4+/5 5/5  Wrist flexion      Wrist extension 4/5 4+/5 4+/5 4+/5  Wrist ulnar deviation      Wrist radial deviation      Wrist pronation      Wrist supination      (Blank rows = not tested)  HAND FUNCTION: Grip strength: Right: 35 lbs; Left: 25 lbs, Lateral pinch: Right: 13 lbs, Left: 11 lbs, and 3 point pinch: Right: 11 lbs, Left: Pt. Unable to assume 3pt. Pinch position 01/17/2023: Grip strength: Right: 45  lbs; Left: 42 lbs, Lateral pinch: Right: 23 lbs, Left: 22 lbs, and 3 point pinch: Right: 18 lbs, Left: 18 COORDINATION: 9 Hole Peg test: Right: placement/removal of 5 pegs in 1 min. & 5 sec; Left: 50 sec Pt. Has difficulty seeing items in the dish on the right side. Pt. Kept moving the vertical pegs between holes with the right 01/17/23: 9 Hole Peg test: Right:1 minute 6 seconds Left: 41 sec Pt. Has difficulty seeing items in the dish on the right side.  SENSATION: Light touch: WFL Proprioception: Impaired   EDEMA: Intact   MUSCLE TONE: RUE: Mild flexor tone. Wife reports right hand presents with flexion in the right hand  COGNITION: Overall cognitive status:  Pt.'s wife reports noticing the Pt. has difficulty with numbers, and letters.  VISION:    Wife reports the Pt. frequently misses items on the right, and bumping into obstacle on the right   VISION ASSESSMENT: To be further assessed in functional context  11/24/22: impaired smooth  pursuits all quadrants, requiring additional head turns; impaired saccades (slow pace, requiring head turns).  Visual fields appear grossly intact, but difficult to assess d/t decreased attention and ability to consistently follow a 2 step command. Moderate-severe R sided inattention noted.  Pt. Has a neuro optometrist appointment scheduled for June.   PERCEPTION:  TBD  PRAXIS: Impaired: Motor planning  TODAY'S TREATMENT:                                                                                                                              DATE: 01/17/2023  Measurements obtained and goals reviewed   Therapeutic Activity: Pt. worked on visual scanning activities by initially worked on completing wide lined simple square mazes. The task was modified, and Pt. focused on completing 3-8 inch one lined simple curves, and vertical/horizontal mazes. Pt. Worked on lined mazes that change, or alternate direction.  PATIENT EDUCATION: Education details: Increasing R sided visual attention Person educated: Patient, spouse Education method: explanation, vc, demo Education comprehension: verbalized understanding and returned demonstration, min vc  HOME EXERCISE PROGRAM: Turquoise theraputty; recommended solitaire for increasing R sided visual attention  GOALS: Goals reviewed with patient?  Yes   SHORT TERM GOALS: Target date: 02/28/23  Pt. Will require Supervision for bilateral UE HEPs.  Baseline: Eval: No current HEP  Goal status: Ongoing  LONG TERM GOALS: Target date: 04/11/2023    Pt. Will increase FOTO score by 2 points for Pt. perceived improvement with assessment specific ADLs, and IADLs  Baseline: Eval:  FOTO score: 53 TR score: 55 01/17/2023: FOTO score: 61 TR: 55 Goal status: Met/Achieved  2.  Pt. Will increase bilateral UE strength by 2 mm grades overall to assist with ADLs, and IADLs Baseline: Eval: Right:Shoulder flexion 4/5, abduction 4-/5, elbow flexion 4+/5, extension 4+/5,  wrist extension 4/5 Left Shoulder flexion 4+/5, abduction 4+/5, elbow flexion 5/5, extension 5/5, wrist extension: 4+/5 01/17/2023: Right:Shoulder flexion 4+/5, abduction 4/5, elbow flexion  4+/5, extension 4+/5, wrist extension 4+/5 Left Shoulder flexion 4+/5, abduction 4+/5, elbow flexion 5/5, extension 5/5, wrist extension: 4+/5 Goal status: Ongoing  3.  Pt. Will improve bilateral hand [facility] skills by 10 sec. In order to be able to manipulate objects during ADLs, and IADLs Baseline: Eval: Right: Pt. Placed/removed 5 pegs in 1 min, & 5 sec. Pt. Kept moving the vertical pegs between holes with the right.  Left: 50 sec.  01/17/2023: 9 Hole Peg test: Right:1 minute 6 seconds Left: 41 sec  Goal status: Ongoing  4.  Pt. Will consistently implements visual compensatory strategies for ADLs, and IADLs Baseline: Eval: Pt. Currently does not utilize. Education to be provided. 01/17/2023: Education provided, continue to implement Goal status: Ongoing  5. Pt. Will improve bilateral grip strength to be able to securely hold items for ADLs, and IADLs. Baseline Eval: Right: 35 lbs; Left: 25 lbs 01/17/2023: Right: 45 lbs; Left: 42 lbs, Goal status: Ongoing  6. Pt. Will improve bilateral lateral pinch strength to be able to securely hold items for ADLs, and IADLs. [place]ne: Right: 13# , Left: 11#,  01/17/23: Right: 23# , Left: 22# Goal status: Ongoing  ASSESSMENT:  CLINICAL IMPRESSION: Pt. Reports that he is doing much better after  multiple recent admission and discharge from the hospital. Measurements were obtained and goals were reviewed with the Pt. Pt. Has shown improvement in overall bilateral UE strength, grip strength, pinch strength and coordination. FOTO score is 61. Pt. Presents with limited attention to R visual field. Pt. presented with difficulty completing square multidirectional mazes. Pt. Was able to complete 3 simple lined mazes with red marker however required increased time, and use of his finger  to help guide, and navigate through the maze. Pt.experienced difficulties completing the mazes when curves were present, with the most difficulty changing to the down direction. Pt. continues to benefit from OT services to continue to work on improving bilateral UE strength, grip strength, pinch strength, motor control, and [facility] skills, provide activities for increasing attention to R visual field, and review visual, and cognitive compensatory strategies in order to improve, and maximize independence with ADLs, and IADL tasks.   PERFORMANCE DEFICITS: in functional skills including ADLs, IADLs, coordination, dexterity, ROM, strength, Fine motor control, Gross motor control, decreased knowledge of use of DME, and UE functional use, cognitive skills including, and psychosocial skills including.   IMPAIRMENTS: are limiting patient from ADLs, IADLs, play, and leisure.   CO-MORBIDITIES: may have co-morbidities  that affects occupational performance. Patient will benefit from skilled OT to address above impairments and improve overall function.  MODIFICATION OR ASSISTANCE TO COMPLETE EVALUATION: Min-Moderate modification of tasks or assist with assess necessary to complete an evaluation.  OT OCCUPATIONAL PROFILE AND HISTORY: Detailed assessment: Review of records and additional review of physical, cognitive, psychosocial history related to current functional performance.  CLINICAL DECISION MAKING: Moderate - several treatment options, min-mod task modification necessary  REHAB POTENTIAL: Good  EVALUATION COMPLEXITY: Moderate  PLAN:  OT FREQUENCY: 2x/week  OT DURATION: 12 weeks  PLANNED INTERVENTIONS: self care/ADL training, therapeutic exercise, therapeutic activity, neuromuscular re-education, manual therapy, and functional mobility training  RECOMMENDED OTHER SERVICES:  OT and ST  CONSULTED AND AGREED WITH PLAN OF CARE: Patient  PLAN FOR NEXT SESSION: Initiate Treatment  Herma Carson,  OTS 12:26 PM, 01/17/2023  This entire session was performed under the direct supervision and direction of a licensed therapist. I have personally read, edited, and approve of the note as written.  Olegario Messier, MS, OTR/L   01/17/2023

## 2023-01-19 ENCOUNTER — Ambulatory Visit: Payer: BC Managed Care – PPO

## 2023-01-19 ENCOUNTER — Ambulatory Visit: Payer: BC Managed Care – PPO | Admitting: Physical Therapy

## 2023-01-19 ENCOUNTER — Encounter: Payer: BC Managed Care – PPO | Admitting: Speech Pathology

## 2023-01-19 DIAGNOSIS — R278 Other lack of coordination: Secondary | ICD-10-CM

## 2023-01-19 DIAGNOSIS — M6281 Muscle weakness (generalized): Secondary | ICD-10-CM

## 2023-01-19 DIAGNOSIS — H539 Unspecified visual disturbance: Secondary | ICD-10-CM

## 2023-01-19 DIAGNOSIS — R262 Difficulty in walking, not elsewhere classified: Secondary | ICD-10-CM

## 2023-01-19 DIAGNOSIS — R2681 Unsteadiness on feet: Secondary | ICD-10-CM

## 2023-01-19 NOTE — Therapy (Signed)
OUTPATIENT PHYSICAL THERAPY NEURO TREATMENT/       Patient Name: Mark Durham MRN: [id number] DOB:Oct 14, 1950, 72 y.o., male Today's Date: 01/19/2023   [facility]: [facility] REFERRING PROVIDER: Hope Pigeon MD   END OF SESSION:  PT End of Session - 01/19/23 1105     Visit Number 18    Number of Visits 41    Date for PT Re-Evaluation 04/11/23    Authorization Type [employer] COMM PRO    Authorization Time Period 11/03/22-01/26/23    PT Start Time 1105    PT Stop Time 1145    PT Time Calculation (min) 40 min    Equipment Utilized During Treatment Gait belt    Activity Tolerance Patient tolerated treatment well    Behavior During Therapy WFL for tasks assessed/performed              Past Medical History:  Diagnosis Date   Hypertension    Past Surgical History:  Procedure Laterality Date   TOTAL HIP ARTHROPLASTY Right    Patient Active Problem List   Diagnosis Date Noted   Sepsis due to pneumonia (HCC) 01/09/2023   Severe sepsis (HCC) 01/09/2023   UTI (urinary tract infection) 01/09/2023   Stroke (HCC) 01/09/2023   HTN (hypertension) 01/09/2023   HLD (hyperlipidemia) 01/09/2023   Hypokalemia 01/09/2023   Tobacco use 01/09/2023   Obesity (BMI 30-39.9) 01/09/2023   CAP (community acquired pneumonia) 01/09/2023   Hypomagnesemia 01/09/2023   Hypophosphatemia 01/09/2023   TIA (transient ischemic attack) 01/05/2023   GERD (gastroesophageal reflux disease) 02/26/2021   S/P hip replacement, right 03/04/2020   Primary osteoarthritis of right hip 01/24/2020   Chronic pain of right knee 11/23/2019   Chronic right hip pain 11/23/2019   Osteoarthritis of knee 11/13/2018   History of adenomatous polyp of colon 02/14/2018   History of Barrett's esophagus 02/14/2018    ONSET DATE: January 2024  REFERRING DIAG: weakness due to stroke   THERAPY DIAG:  Muscle weakness (generalized)  Visual disturbance following cerebrovascular accident (CVA)  Other lack of  coordination  Difficulty in walking, not elsewhere classified  Unsteadiness on feet  Vision disturbance following CVA (cerebrovascular accident)  Rationale for Evaluation and Treatment: Rehabilitation  SUBJECTIVE:                                                                                                                                                                                             SUBJECTIVE STATEMENT:  Pt reports doing well today. Pt reports that he was in the hospital for TIA, UTI, and then Pneumonia last Week. Pt arrives to PT to new  PT orders given recent hospitalization. Pt has been on antibiotics for UTI and Pneumonia and will be completing course today.   Pt accompanied by: significant other  PERTINENT HISTORY: Patient presents to physical therapy for weakness s/p stroke . 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 Per documentation he had a secondary stroke during the hospital stay as well as three seizures. Patient was admitted to [facility] Acute inpatient rehabilitation from 08/10/22-08/31/22 where he was discharged home. Patient was discharged from Home health on 10/28/22 (PT, OT, and ST). PMH includes GERD, OA, R knee/hip pain, Barrett's esophagus. Is using a walker, still drags right foot. Needs assistance for transfers but can bath and dress self.   PAIN:  Are you having pain? No  PRECAUTIONS: Fall  WEIGHT BEARING RESTRICTIONS: No  FALLS: Has patient fallen in last 6 months? No    PATIENT GOALS: walking with less AD, get back to mowing the yard, gardening, grilling     OBJECTIVE:   TODAY'S TREATMENT: DATE: 01/19/23  Nustep BLE endurance and reciprocal movement training  1 min level 1  4 min level 3  1 min level 1  Isntruction from PT throughout for improved ROM on the RLE into full knee extension as well as consistent SPM throughout entire bout on nustep through variable resistance   Gait in rehab gym x  67ft, 54ft , 34ft, 162ft with CGA from PT for safety, no AD noted to have increased toe drag on the R side with fatigue requiring mod-max cues to attention to the RLE and instruction of increased heel contact to facilitate increased step length   Sit<>stand 2 x 5 with emphasis on increased speed.  Stepping over cane on floor with RUE support on rail at wall 2 x 8 bil  Reciprocal march 2 x 10 bil  HS curl in standing x 11 bil.  Side stepping at rail on wall 3 x 53ft with min assist to prevent trunk rotation.     PATIENT EDUCATION: Education details: goals, POC, HEP Pt educated throughout session about proper posture and technique with exercises. Improved exercise technique, movement at target joints, use of target muscles after min to mod verbal, visual, tactile cues.  Person educated: Patient and Spouse Education method: Explanation, Demonstration, Tactile cues, and Verbal cues Education comprehension: verbalized understanding, returned demonstration, verbal cues required, and tactile cues required  HOME EXERCISE PROGRAM:  Access Code: [email or web address] URL: [email or web address] Date: 12/23/2022 Prepared by: Grier Rocher  Exercises - Standing Tandem Balance with Counter Support  - 1 x daily - 5 x weekly - 3 sets - 4 reps - 10 hold - Standing Single Leg Stance with Counter Support  - 1 x daily - 5 x weekly - 3 sets - 4 reps - 10 hold  Access Code: [email or web address] URL: [email or web address] Date: 11/03/2022 Prepared by: Precious Bard  Exercises - Seated March  - 1 x daily - 7 x weekly - 2 sets - 10 reps - 5 hold - Seated Long Arc Quad  - 1 x daily - 7 x weekly - 2 sets - 10 reps - 5 hold - Seated Heel Toe Raises  - 1 x daily - 7 x weekly - 2 sets - 10 reps - 5 hold - Seated Hip Abduction  - 1 x daily - 7 x weekly - 2 sets - 10 reps - 5 hold - Seated Isometric Hip Adduction with Ball  - 1 x daily - 7 x weekly - 2  sets - 10 reps - 5 hold  GOALS: Goals reviewed with  patient? Yes  SHORT TERM GOALS: Target date: 01/17/2023  Patient will be independent in home exercise program to improve strength/mobility for better functional independence with ADLs. Baseline:4/24: HEP given  Goal status: IN PROGRESS    LONG TERM GOALS: Target date: 01/26/2023  Patient will increase FOTO score to equal to or greater than  56 to demonstrate statistically significant improvement in mobility and quality of life.   Baseline: 40 6/3: 41 7/8:54 Goal status: REVISED  2.  Patient (> 30 years old) will complete five times sit to stand test in < 15 seconds indicating an increased LE strength and improved balance. Baseline: 4/24: 38.37 with BUE support 6/3: 12.02 sec with UE support 7/8: 18.12 sec without UE assist Goal status: IN PROGRESS  3.  Patient will increase 10 meter walk test to >1.60m/s as to improve gait speed for better community ambulation and to reduce fall risk. Baseline: 4/24: 19.2 seconds with RW; dragging of R foot  6/3: 17.175 with no AD and CGA from PT.  7/8: 14.6 sec without AD (0.17m/s)  Goal status: IN PROGRESS  4.  Patient will increase Berg Balance score by > 45points to demonstrate decreased fall risk during functional activities. Baseline: 4/24: see next session 5/2: 30/56 6/3: 31/56 7/8 42/56  Goal status: REVISED In progress    5.  Patient will increase 2 min walk test by >57ft as to demonstrate reduced fall risk and improved dynamic gait balance for better safety with community/home ambulation.   Baseline: 201 Goal status: INITIAL     ASSESSMENT:  CLINICAL IMPRESSION:  Patient appeared ready for treatment on this day. PT treatment focused on BLE strengthening and functional movement training to facilitate improved hip/knee flexion on the RLE to reduce foot drag. Pt continues to require moderate instruction for attention the R to redue fall risk and prevent foot drag when fatigued. Pt will continue to benefit from skilled therapy to  address remaining deficits in order to improve overall QoL and return to PLOF.      OBJECTIVE IMPAIRMENTS: Abnormal gait, cardiopulmonary status limiting activity, decreased activity tolerance, decreased balance, decreased cognition, decreased coordination, decreased endurance, decreased knowledge of condition, decreased knowledge of use of DME, decreased mobility, difficulty walking, decreased strength, decreased safety awareness, impaired perceived functional ability, impaired flexibility, impaired UE functional use, impaired vision/preception, improper body mechanics, and postural dysfunction.   ACTIVITY LIMITATIONS: carrying, lifting, bending, sitting, standing, squatting, sleeping, stairs, transfers, bed mobility, bathing, toileting, reach over head, hygiene/grooming, locomotion level, and caring for others  PARTICIPATION LIMITATIONS: meal prep, cleaning, laundry, medication management, personal finances, interpersonal relationship, driving, shopping, community activity, and yard work  PERSONAL FACTORS: Age, Past/current experiences, Time since onset of injury/illness/exacerbation, Transportation, and 3+ comorbidities: GERD, OA, R knee/hip pain, Barrett's esophagus  are also affecting patient's functional outcome.   REHAB POTENTIAL: Good  CLINICAL DECISION MAKING: Evolving/moderate complexity  EVALUATION COMPLEXITY: Moderate  PLAN:  PT FREQUENCY: 2x/week  PT DURATION: 12 weeks  PLANNED INTERVENTIONS: Therapeutic exercises, Therapeutic activity, Neuromuscular re-education, Balance training, Gait training, Patient/Family education, Self Care, Joint mobilization, Joint manipulation, Stair training, Vestibular training, Canalith repositioning, Visual/preceptual remediation/compensation, Orthotic/Fit training, Electrical stimulation, Wheelchair mobility training, Spinal mobilization, Cryotherapy, Moist heat, Splintting, Taping, Ultrasound, Ionotophoresis 4mg /ml Dexamethasone, Manual therapy,  and Re-evaluation  PLAN FOR NEXT SESSION:   Dynamic balance training endurance training Visual scanning tasks.  Variable gait training.   Grier Rocher PT, DPT  Physical Therapist -  [facility]  [facility]  12:15 PM 01/19/23

## 2023-01-20 ENCOUNTER — Ambulatory Visit: Payer: BC Managed Care – PPO | Admitting: Physical Therapy

## 2023-01-20 NOTE — Therapy (Signed)
OUTPATIENT OCCUPATIONAL THERAPY NEURO PROGRESS AND TREATMENT NOTE Reporting period beginning 11/15/22-01/19/23    Patient Name: Mark Durham MRN: [id number] DOB:09-07-1950, 72 y.o., male Today's Date: 01/20/2023  PCP: [facility] REFERRING PROVIDER: Hope Pigeon, MD  END OF SESSION:  OT End of Session - 01/20/23 1553     Visit Number 10    Number of Visits 24    Date for OT Re-Evaluation 04/11/23    Progress Note Due on Visit 10    OT Start Time 1015    OT Stop Time 1100    OT Time Calculation (min) 45 min    Equipment Utilized During Treatment transport chair    Activity Tolerance Patient tolerated treatment well    Behavior During Therapy [facility] for tasks assessed/performed               Past Medical History:  Diagnosis Date   Hypertension    Past Surgical History:  Procedure Laterality Date   TOTAL HIP ARTHROPLASTY Right    Patient Active Problem List   Diagnosis Date Noted   Sepsis due to pneumonia (HCC) 01/09/2023   Severe sepsis (HCC) 01/09/2023   UTI (urinary tract infection) 01/09/2023   Stroke (HCC) 01/09/2023   HTN (hypertension) 01/09/2023   HLD (hyperlipidemia) 01/09/2023   Hypokalemia 01/09/2023   Tobacco use 01/09/2023   Obesity (BMI 30-39.9) 01/09/2023   CAP (community acquired pneumonia) 01/09/2023   Hypomagnesemia 01/09/2023   Hypophosphatemia 01/09/2023   TIA (transient ischemic attack) 01/05/2023   GERD (gastroesophageal reflux disease) 02/26/2021   S/P hip replacement, right 03/04/2020   Primary osteoarthritis of right hip 01/24/2020   Chronic pain of right knee 11/23/2019   Chronic right hip pain 11/23/2019   Osteoarthritis of knee 11/13/2018   History of adenomatous polyp of colon 02/14/2018   History of Barrett's esophagus 02/14/2018   ONSET DATE:  07/25/2022  REFERRING DIAG:  CVA  THERAPY DIAG:  Muscle weakness (generalized)  Other lack of coordination  Vision disturbance following CVA (cerebrovascular  accident)  Rationale for Evaluation and Treatment: Rehabilitation  SUBJECTIVE:  SUBJECTIVE STATEMENT: Pt requested to work on handwriting this date.  Accompanied by: spouse  PERTINENT HISTORY:   01/17/2023 Addendum:  Pt. had 2 recent ED visits with hospitalizations. On 01/05/23 Pt. was hospitalized due sudden headache, slurred speech, and right sided facial drop. CT scan of the head showed no acute intracranial abnormality however shoes chronic small vessel ischemic disease. CT angio of the head did not show any LVO or other emergent finding. 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 MRI resulted in no acute finding however showed "Asymmetric T2/FLAIR signal intensity involving the right transverse and sigmoid sinuses as well as the right jugular bulb". Pt. was hospitalized again 01/09/2023 due to severe sepsis and was discharged 01/12/2023.  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 Per chart review, Pt. had a secondary stroke during the hospital stay as well as three seizures. Patient was admitted to [facility] Acute inpatient rehabilitation from 08/10/22-08/31/22 where he was discharged home. Patient was discharged from Home health on 10/28/22 (PT, OT, and ST). PMH includes: GERD, OA, R knee/hip pain, Barrett's esophagus. Pt. Had recent ED visit on 01/05/23 due sudden headache, slurred speech, and right sided facial drop. CT scan of the head showed no acute intracranial abnormality however shoes chronic small vessel ischemic disease. CT  angio of the head did not show any LVO or other emergent finding. 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 MRI resulted in no acute finding however showed "Asymmetric T2/FLAIR  signal intensity involving the right transverse and sigmoid sinuses as well as the right jugular bulb". Pt. Was hospitalized 01/09/2023 due to severe sepsis and was discharged 01/12/2023.  PRECAUTIONS: None  WEIGHT BEARING RESTRICTIONS: No  PAIN:  Are you having pain? No  FALLS: Has patient fallen in last 6 months? No  LIVING ENVIRONMENT: Lives with: lives with their spouse Lives in: House/apartment Stairs: 5 steps to enter; one level home Has following equipment at home: Environmental consultant - 2 wheeled, Wheelchair (manual), shower chair, and bed side commode  PLOF: Independent  PATIENT GOALS: To get to walking  OBJECTIVE:   HAND DOMINANCE: Right  ADLs: Transfers/ambulation related to ADLs: Eating: Independent with utensils, assist with cutting food.  Grooming: Independent oral care, wife assists with shaving. UB Dressing: Independent LB Dressing: independent Toileting: Independent Bathing: Once in the shower Supervision with bathing Tub Shower transfers: CGA with showere chair  IADLs: Shopping: Wife performs Light housekeeping: Wife performs most IADLs Meal Prep:  Meal preparation Community mobility: Relies on family and friends for driving. Medication management: Wife sets up, and provides medication Financial management: Wife completes Handwriting:  Pt. Wife reports Difficulty with identifying letters, and numbers   MOBILITY STATUS: Uses a rolling walker  POSTURE COMMENTS:  No Significant postural limitations  ACTIVITY TOLERANCE: Activity tolerance:  Fair  FUNCTIONAL OUTCOME MEASURES: FOTO: 53  TR score: 55 01/17/23: FOTO: 61 TR score: 55  UPPER EXTREMITY ROM:    Active ROM Right Eval [facility] Left Eval [facility] Right 01/17/2023 [facility] R 01/17/2023 [facility]  Shoulder flexion      Shoulder abduction      Shoulder adduction      Shoulder extension      Shoulder internal rotation      Shoulder external rotation      Elbow flexion      Elbow extension      Wrist flexion      Wrist  extension      Wrist ulnar deviation      Wrist radial deviation      Wrist pronation      Wrist supination      (Blank rows = not tested)  UPPER EXTREMITY MMT:     MMT Right Eval 4/5 Left Eval 4+/5 Right 01/17/23 Left 01/17/23  Shoulder flexion 4-/5 4+/5 4+/5 4+/5  Shoulder abduction  l 4/5 4+/5  Shoulder adduction      Shoulder extension      Shoulder internal rotation      Shoulder external rotation      Middle trapezius      Lower trapezius      Elbow flexion 4+/5 5/5 4+/5 5/5  Elbow extension 4+/5 5/5 4+/5 5/5  Wrist flexion      Wrist extension 4/5 4+/5 4+/5 4+/5  Wrist ulnar deviation      Wrist radial deviation      Wrist pronation      Wrist supination      (Blank rows = not tested)  HAND FUNCTION: Grip strength: Right: 35 lbs; Left: 25 lbs, Lateral pinch: Right: 13 lbs, Left: 11 lbs, and 3 point pinch: Right: 11 lbs, Left: Pt. Unable to assume 3pt. Pinch position 01/17/2023: Grip strength: Right: 45 lbs;  Left: 42 lbs, Lateral pinch: Right: 23 lbs, Left: 22 lbs, and 3 point pinch: Right: 18 lbs, Left: 18  COORDINATION: 9 Hole Peg test: Right: placement/removal of 5 pegs in 1 min. & 5 sec; Left: 50 sec Pt. Has difficulty seeing items in the dish on the right side. Pt. Kept moving the vertical pegs between holes with the right 01/17/23: 9 Hole Peg test: Right:1 minute 6 seconds Left: 41 sec Pt. Has difficulty seeing items in the dish on the right side.  SENSATION: Light touch: WFL Proprioception: Impaired   EDEMA: Intact   MUSCLE TONE: RUE: Mild flexor tone. Wife reports right hand presents with flexion in the right hand  COGNITION: Overall cognitive status:  Pt.'s wife reports noticing the Pt. has difficulty with numbers, and letters.  VISION:    Wife reports the Pt. frequently misses items on the right, and bumping into obstacle on the right   VISION ASSESSMENT: To be further assessed in functional context  11/24/22: impaired smooth pursuits all quadrants,  requiring additional head turns; impaired saccades (slow pace, requiring head turns).  Visual fields appear grossly intact, but difficult to assess d/t decreased attention and ability to consistently follow a 2 step command. Moderate-severe R sided inattention noted.  Pt. Has a neuro optometrist appointment scheduled for June.   PERCEPTION:  TBD  PRAXIS: Impaired: Motor planning  TODAY'S TREATMENT:                                                                                                                              DATE: 01/19/2023  Measurements for progress update carried over from last visit after measures were taken following recent hospitalizations.  Therapeutic Activity:  Participation in handwriting skills writing simple words on lined paper, first and last name, wife's first name, date, numbers 0-10, and alphabet.  Pt required extensive verbal and visual cues for spacing and writing in a straight line using highlighted lines and boxes for letter placement.  Noted dysgraphia with pt struggling to construct various letters of the alphabet, requiring a copied sample above the line where he was writing.   PATIENT EDUCATION: Education details: Careers information officer Person educated: Patient, spouse Education method: explanation, vc, demo Education comprehension: pt/spouse verbalized understanding; further training needed  HOME EXERCISE PROGRAM: [place] theraputty; recommended solitaire for increasing R sided visual attention, handwriting practice  GOALS: Goals reviewed with patient?  Yes   SHORT TERM GOALS: Target date: 02/28/23  Pt. Will require Supervision for bilateral UE HEPs.  Baseline: Eval: No current HEP; 01/19/23: pt completes theraputty exercises for R hand strength/coordination; pt/spouse have been provided with activities to increase attention to R visual field, and handwriting activities.  Goal status: Ongoing  LONG TERM GOALS: Target date: 04/11/2023    Pt.  Will increase FOTO score by 2 points for Pt. perceived improvement with assessment specific ADLs, and IADLs  Baseline: Eval:  FOTO score: 53 TR score: 55 01/17/2023: FOTO score: 61 TR:  55 Goal status: Met/Achieved  2.  Pt. Will increase bilateral UE strength by 2 mm grades overall to assist with ADLs, and IADLs Baseline: Eval: Right:Shoulder flexion 4/5, abduction 4-/5, elbow flexion 4+/5, extension 4+/5, wrist extension 4/5 Left Shoulder flexion 4+/5, abduction 4+/5, elbow flexion 5/5, extension 5/5, wrist extension: 4+/5 01/17/2023: Right:Shoulder flexion 4+/5, abduction 4/5, elbow flexion 4+/5, extension 4+/5, wrist extension 4+/5 Left Shoulder flexion 4+/5, abduction 4+/5, elbow flexion 5/5, extension 5/5, wrist extension: 4+/5 Goal status: Ongoing  3.  Pt. Will improve bilateral hand [facility] skills by 10 sec. In order to be able to manipulate objects during ADLs, and IADLs Baseline: Eval: Right: Pt. Placed/removed 5 pegs in 1 min, & 5 sec. Pt. Kept moving the vertical pegs between holes with the right.  Left: 50 sec; 01/17/2023: 9 Hole Peg test: Right:1 minute 6 seconds Left: 41 sec  Goal status: Ongoing  4.  Pt will consistently implement visual compensatory strategies for ADLs, and IADLs Baseline: Eval: Pt. Currently does not utilize. Education to be provided. 01/17/2023: Education provided, continue to implement Goal status: Ongoing  5. Pt. Will improve bilateral grip strength to be able to securely hold items for ADLs, and IADLs. Baseline Eval: Right: 35 lbs; Left: 25 lbs; 01/17/2023: Right: 45 lbs; Left: 42 lbs, Goal status: Ongoing  6. Pt. Will improve bilateral lateral pinch strength to be able to securely hold items for ADLs, and IADLs. Baseline: Right: 13# , Left: 11#; 01/17/23: Right: 23# , Left: 22# Goal status: Ongoing  7. Pt will print first and last name with min vc or less to be able to sign a greeting card. Baseline: 01/19/23: Able to print first name with extra time, and was able to  print last name only when copying it, otherwise pt could only construct 4 of the 7 letters in his last name, and letters were not in the correct sequence. Goal status: New  ASSESSMENT:  CLINICAL IMPRESSION: Pt seen for 10th visit progress update.  Measurements carried over from last session as they were taken last visit to screen for any changes after a recent hospitalization.  Overall, R grip strength, pinch strength, and coordination measures have all improved.  FOTO score has improved from 53 to 61. Though improved, pt continues to present with limited attention to R visual field.  Pt requested to work on Public relations account executive today, and OT did note some word crowding d/t decreased attention to R visual field, as well as some dysgraphia noted with pt struggling to construct 4 of 7 letters in his last name, and also writing them in an incorrect sequence.  New goal made for pt to be able to print first and last name with min vc or less to be able to sign a greeting card.  Pt. continues to benefit from OT services to continue to increase attention to R visual field, improve basic handwriting skills, and review visual, and cognitive compensatory strategies in order to improve, and maximize independence with ADLs, and IADL tasks.   PERFORMANCE DEFICITS: in functional skills including ADLs, IADLs, coordination, dexterity, ROM, strength, Fine motor control, Gross motor control, decreased knowledge of use of DME, and UE functional use, cognitive skills including, and psychosocial skills including.   IMPAIRMENTS: are limiting patient from ADLs, IADLs, play, and leisure.   CO-MORBIDITIES: may have co-morbidities  that affects occupational performance. Patient will benefit from skilled OT to address above impairments and improve overall function.  MODIFICATION OR ASSISTANCE TO COMPLETE EVALUATION: Min-Moderate modification of tasks  or assist with assess necessary to complete an evaluation.  OT OCCUPATIONAL  PROFILE AND HISTORY: Detailed assessment: Review of records and additional review of physical, cognitive, psychosocial history related to current functional performance.  CLINICAL DECISION MAKING: Moderate - several treatment options, min-mod task modification necessary  REHAB POTENTIAL: Good  EVALUATION COMPLEXITY: Moderate  PLAN:  OT FREQUENCY: 2x/week  OT DURATION: 12 weeks  PLANNED INTERVENTIONS: self care/ADL training, therapeutic exercise, therapeutic activity, neuromuscular re-education, manual therapy, and functional mobility training  RECOMMENDED OTHER SERVICES:  OT and ST  CONSULTED AND AGREED WITH PLAN OF CARE: Patient  PLAN FOR NEXT SESSION: Initiate Treatment  Danelle Earthly, MS, OTR/L

## 2023-01-25 ENCOUNTER — Ambulatory Visit: Payer: BC Managed Care – PPO | Admitting: Physical Therapy

## 2023-01-25 ENCOUNTER — Encounter: Payer: BC Managed Care – PPO | Admitting: Speech Pathology

## 2023-01-25 DIAGNOSIS — M6281 Muscle weakness (generalized): Secondary | ICD-10-CM

## 2023-01-25 DIAGNOSIS — R2681 Unsteadiness on feet: Secondary | ICD-10-CM

## 2023-01-25 DIAGNOSIS — R278 Other lack of coordination: Secondary | ICD-10-CM

## 2023-01-25 DIAGNOSIS — H539 Unspecified visual disturbance: Secondary | ICD-10-CM

## 2023-01-25 DIAGNOSIS — I69398 Other sequelae of cerebral infarction: Secondary | ICD-10-CM

## 2023-01-25 DIAGNOSIS — R262 Difficulty in walking, not elsewhere classified: Secondary | ICD-10-CM

## 2023-01-25 NOTE — Therapy (Unsigned)
OUTPATIENT PHYSICAL THERAPY NEURO TREATMENT/       Patient Name: Mark Durham MRN: [id number] DOB:06/15/1951, 72 y.o., male Today's Date: 01/25/2023   [facility]P: [facility] REFERRING PROVIDER: Hope Pigeon MD   END OF SESSION:  PT End of Session - 01/25/23 1428     Visit Number 19    Number of Visits 41    Date for PT Re-Evaluation 04/11/23    Authorization Type [employer] COMM PRO    Authorization Time Period 11/03/22-01/26/23    PT Start Time 1421    PT Stop Time 1500    PT Time Calculation (min) 39 min    Equipment Utilized During Treatment Gait belt    Activity Tolerance Patient tolerated treatment well    Behavior During Therapy WFL for tasks assessed/performed              Past Medical History:  Diagnosis Date   Hypertension    Past Surgical History:  Procedure Laterality Date   TOTAL HIP ARTHROPLASTY Right    Patient Active Problem List   Diagnosis Date Noted   Sepsis due to pneumonia (HCC) 01/09/2023   Severe sepsis (HCC) 01/09/2023   UTI (urinary tract infection) 01/09/2023   Stroke (HCC) 01/09/2023   HTN (hypertension) 01/09/2023   HLD (hyperlipidemia) 01/09/2023   Hypokalemia 01/09/2023   Tobacco use 01/09/2023   Obesity (BMI 30-39.9) 01/09/2023   CAP (community acquired pneumonia) 01/09/2023   Hypomagnesemia 01/09/2023   Hypophosphatemia 01/09/2023   TIA (transient ischemic attack) 01/05/2023   GERD (gastroesophageal reflux disease) 02/26/2021   S/P hip replacement, right 03/04/2020   Primary osteoarthritis of right hip 01/24/2020   Chronic pain of right knee 11/23/2019   Chronic right hip pain 11/23/2019   Osteoarthritis of knee 11/13/2018   History of adenomatous polyp of colon 02/14/2018   History of Barrett's esophagus 02/14/2018    ONSET DATE: January 2024  REFERRING DIAG: weakness due to stroke   THERAPY DIAG:  Muscle weakness (generalized)  Other lack of coordination  Vision disturbance following CVA  (cerebrovascular accident)  Difficulty in walking, not elsewhere classified  Unsteadiness on feet  Rationale for Evaluation and Treatment: Rehabilitation  SUBJECTIVE:                                                                                                                                                                                             SUBJECTIVE STATEMENT:  Pt reports doing well today. Reports that he is feeling better and trying to move more at home.   Pt accompanied by: significant other  PERTINENT HISTORY: Patient presents to physical therapy for  weakness s/p stroke . 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 Per documentation he had a secondary stroke during the hospital stay as well as three seizures. Patient was admitted to [facility] Acute inpatient rehabilitation from 08/10/22-08/31/22 where he was discharged home. Patient was discharged from Home health on 10/28/22 (PT, OT, and ST). PMH includes GERD, OA, R knee/hip pain, Barrett's esophagus. Is using a walker, still drags right foot. Needs assistance for transfers but can bath and dress self.   PAIN:  Are you having pain? No  PRECAUTIONS: Fall  WEIGHT BEARING RESTRICTIONS: No  FALLS: Has patient fallen in last 6 months? No    PATIENT GOALS: walking with less AD, get back to mowing the yard, gardening, grilling     OBJECTIVE:   TODAY'S TREATMENT: DATE: 01/25/23  Gait with rollator x 151ft with CGA-supervision assist  cues for step length on the RLE .   Dynamic gait with rollator:  Forward/reverse 2 x 96ft  Weave through 6 cones 2 x 13ft    Gait without rollator:  Forward reverse. 2 x 12 ft each Weave through 4 cones, 2 x 59ft  Dynamic balance:  Stepping forward over cane 2 x 10 bil  Lateral stepping over cane2 x 10 bil   Throughout session, cues from PT for improved step length on the RLE as well as improved visual scanning to the R to avoid obstacles.  Constant cues to improve position of AD to prevent anterior LOB.  PT provided CGA throughout session for safety with min cues for weight shift for stepping over SPC in floor and improved posture with dynamic gait training to improve symmetry of gait pattern.      PATIENT EDUCATION: Education details: goals, POC, HEP Pt educated throughout session about proper posture and technique with exercises. Improved exercise technique, movement at target joints, use of target muscles after min to mod verbal, visual, tactile cues.  Person educated: Patient and Spouse Education method: Explanation, Demonstration, Tactile cues, and Verbal cues Education comprehension: verbalized understanding, returned demonstration, verbal cues required, and tactile cues required  HOME EXERCISE PROGRAM:  Access Code: [email or web address] URL: [email or web address] Date: 12/23/2022 Prepared by: Grier Rocher  Exercises - Standing Tandem Balance with Counter Support  - 1 x daily - 5 x weekly - 3 sets - 4 reps - 10 hold - Standing Single Leg Stance with Counter Support  - 1 x daily - 5 x weekly - 3 sets - 4 reps - 10 hold  Access Code: [email or web address] URL: [email or web address] Date: 11/03/2022 Prepared by: Precious Bard  Exercises - Seated March  - 1 x daily - 7 x weekly - 2 sets - 10 reps - 5 hold - Seated Long Arc Quad  - 1 x daily - 7 x weekly - 2 sets - 10 reps - 5 hold - Seated Heel Toe Raises  - 1 x daily - 7 x weekly - 2 sets - 10 reps - 5 hold - Seated Hip Abduction  - 1 x daily - 7 x weekly - 2 sets - 10 reps - 5 hold - Seated Isometric Hip Adduction with Ball  - 1 x daily - 7 x weekly - 2 sets - 10 reps - 5 hold  GOALS: Goals reviewed with patient? Yes  SHORT TERM GOALS: Target date: 01/17/2023  Patient will be independent in home exercise program to improve strength/mobility for better functional independence with ADLs. Baseline:4/24: HEP given  Goal status: IN PROGRESS  LONG TERM  GOALS: Target date: 01/26/2023  Patient will increase FOTO score to equal to or greater than  56 to demonstrate statistically significant improvement in mobility and quality of life.   Baseline: 40 6/3: 41 7/8:54 Goal status: REVISED  2.  Patient (> 59 years old) will complete five times sit to stand test in < 15 seconds indicating an increased LE strength and improved balance. Baseline: 4/24: 38.37 with BUE support 6/3: 12.02 sec with UE support 7/8: 18.12 sec without UE assist Goal status: IN PROGRESS  3.  Patient will increase 10 meter walk test to >1.35m/s as to improve gait speed for better community ambulation and to reduce fall risk. Baseline: 4/24: 19.2 seconds with RW; dragging of R foot  6/3: 17.175 with no AD and CGA from PT.  7/8: 14.6 sec without AD (0.73m/s)  Goal status: IN PROGRESS  4.  Patient will increase Berg Balance score by > 45points to demonstrate decreased fall risk during functional activities. Baseline: 4/24: see next session 5/2: 30/56 6/3: 31/56 7/8 42/56  Goal status: REVISED In progress    5.  Patient will increase 2 min walk test by >28ft as to demonstrate reduced fall risk and improved dynamic gait balance for better safety with community/home ambulation.   Baseline: 201 Goal status: INITIAL     ASSESSMENT:  CLINICAL IMPRESSION:  Patient appeared ready for treatment on this day. PT treatment focused on functional dynamic balance training through gait training. Pt demonstrates reduced step length on the RLE when fatigued and with novel tasks to step over cane and dynamic balance training. Pt noted to require increased cues to attend to task this AM. Pt will continue to benefit from skilled therapy to address remaining deficits in order to improve overall QoL and return to PLOF.      OBJECTIVE IMPAIRMENTS: Abnormal gait, cardiopulmonary status limiting activity, decreased activity tolerance, decreased balance, decreased cognition, decreased  coordination, decreased endurance, decreased knowledge of condition, decreased knowledge of use of DME, decreased mobility, difficulty walking, decreased strength, decreased safety awareness, impaired perceived functional ability, impaired flexibility, impaired UE functional use, impaired vision/preception, improper body mechanics, and postural dysfunction.   ACTIVITY LIMITATIONS: carrying, lifting, bending, sitting, standing, squatting, sleeping, stairs, transfers, bed mobility, bathing, toileting, reach over head, hygiene/grooming, locomotion level, and caring for others  PARTICIPATION LIMITATIONS: meal prep, cleaning, laundry, medication management, personal finances, interpersonal relationship, driving, shopping, community activity, and yard work  PERSONAL FACTORS: Age, Past/current experiences, Time since onset of injury/illness/exacerbation, Transportation, and 3+ comorbidities: GERD, OA, R knee/hip pain, Barrett's esophagus  are also affecting patient's functional outcome.   REHAB POTENTIAL: Good  CLINICAL DECISION MAKING: Evolving/moderate complexity  EVALUATION COMPLEXITY: Moderate  PLAN:  PT FREQUENCY: 2x/week  PT DURATION: 12 weeks  PLANNED INTERVENTIONS: Therapeutic exercises, Therapeutic activity, Neuromuscular re-education, Balance training, Gait training, Patient/Family education, Self Care, Joint mobilization, Joint manipulation, Stair training, Vestibular training, Canalith repositioning, Visual/preceptual remediation/compensation, Orthotic/Fit training, Electrical stimulation, Wheelchair mobility training, Spinal mobilization, Cryotherapy, Moist heat, Splintting, Taping, Ultrasound, Ionotophoresis 4mg /ml Dexamethasone, Manual therapy, and Re-evaluation  PLAN FOR NEXT SESSION:   BLE strengthening. Functional gait training.   Grier Rocher PT, DPT  Physical Therapist - [place]  [facility]  3:23 PM 01/25/23

## 2023-01-27 ENCOUNTER — Emergency Department
Admission: EM | Admit: 2023-01-27 | Discharge: 2023-01-27 | Disposition: A | Discharge status: Home / Self Care | Payer: BC Managed Care – PPO | Attending: Emergency Medicine | Admitting: Emergency Medicine

## 2023-01-27 ENCOUNTER — Ambulatory Visit: Payer: BC Managed Care – PPO | Admitting: Physical Therapy

## 2023-01-27 ENCOUNTER — Encounter: Payer: BC Managed Care – PPO | Admitting: Speech Pathology

## 2023-01-27 ENCOUNTER — Other Ambulatory Visit: Payer: Self-pay

## 2023-01-27 ENCOUNTER — Emergency Department: Payer: BC Managed Care – PPO

## 2023-01-27 ENCOUNTER — Ambulatory Visit: Payer: BC Managed Care – PPO | Admitting: Occupational Therapy

## 2023-01-27 DIAGNOSIS — H538 Other visual disturbances: Secondary | ICD-10-CM | POA: Diagnosis not present

## 2023-01-27 DIAGNOSIS — M6281 Muscle weakness (generalized): Secondary | ICD-10-CM

## 2023-01-27 DIAGNOSIS — R519 Headache, unspecified: Secondary | ICD-10-CM | POA: Diagnosis not present

## 2023-01-27 HISTORY — DX: Cerebral infarction, unspecified: I63.9

## 2023-01-27 LAB — BASIC METABOLIC PANEL
Anion gap: 10 (ref 5–15)
BUN: 15 mg/dL (ref 8–23)
CO2: 22 mmol/L (ref 22–32)
Calcium: 9.5 mg/dL (ref 8.9–10.3)
Chloride: 105 mmol/L (ref 98–111)
Creatinine, Ser: 1.07 mg/dL (ref 0.61–1.24)
GFR, Estimated: >60 mL/min (ref >60)
Glucose, Bld: 94 mg/dL (ref 70–99)
Potassium: 3.7 mmol/L (ref 3.5–5.1)
Sodium: 137 mmol/L (ref 135–145)

## 2023-01-27 LAB — URINALYSIS, ROUTINE W REFLEX MICROSCOPIC
Bilirubin Urine: NEGATIVE
Glucose, UA: NEGATIVE mg/dL
Hgb urine dipstick: NEGATIVE
Ketones, ur: NEGATIVE mg/dL
Leukocytes,Ua: NEGATIVE
Nitrite: NEGATIVE
Protein, ur: NEGATIVE mg/dL
Specific Gravity, Urine: 1.019 (ref 1.005–1.030)
pH: 5 (ref 5.0–8.0)

## 2023-01-27 LAB — CBC
HCT: 39.8 % (ref 39.0–52.0)
Hemoglobin: 12.4 g/dL — ABNORMAL LOW (ref 13.0–17.0)
MCH: 25.9 pg — ABNORMAL LOW (ref 26.0–34.0)
MCHC: 31.2 g/dL (ref 30.0–36.0)
MCV: 83.3 fL (ref 80.0–100.0)
Platelets: 333 10*3/uL (ref 150–400)
RBC: 4.78 MIL/uL (ref 4.22–5.81)
RDW: 14.6 % (ref 11.5–15.5)
WBC: 8.2 10*3/uL (ref 4.0–10.5)
nRBC: 0 % (ref 0.0–0.2)

## 2023-01-27 LAB — CBG MONITORING, ED: Glucose-Capillary: 95 mg/dL (ref 70–99)

## 2023-01-27 MED ORDER — PROCHLORPERAZINE EDISYLATE 10 MG/2ML IJ SOLN
10.0000 mg | Freq: Once | INTRAMUSCULAR | Status: AC
  Administered 2023-01-27: 10 mg via INTRAVENOUS
  Filled 2023-01-27: qty 2

## 2023-01-27 MED ORDER — SODIUM CHLORIDE 0.9 % IV BOLUS
500.0000 mL | Freq: Once | INTRAVENOUS | Status: AC
  Administered 2023-01-27: 500 mL via INTRAVENOUS

## 2023-01-27 MED ORDER — BUTALBITAL-APAP-CAFFEINE 50-325-40 MG PO TABS
1.0000 | ORAL_TABLET | Freq: Once | ORAL | Status: AC
  Administered 2023-01-27: 1 via ORAL
  Filled 2023-01-27: qty 1

## 2023-01-27 NOTE — ED Triage Notes (Addendum)
Pt arrives to triage via POV from occupational therapy with c/o HA and blurred vision that started yesterday evening around 1800. Pt also reporting generalized weakness when standing that started today around 11:30.   NIH 0 at this time  Dr Vicente Males made aware   Hx previous CVA

## 2023-01-27 NOTE — Discharge Instructions (Signed)
Please seek medical attention for any high fevers, chest pain, shortness of breath, change in behavior, persistent vomiting, bloody stool or any other new or concerning symptoms.  

## 2023-01-27 NOTE — ED Provider Notes (Signed)
[facility] Provider Note    Event Date/Time   First MD Initiated Contact with Patient 01/27/23 1615     (approximate)   History   Headache and Blurred Vision   HPI  Mark Durham is a 72 y.o. male who presents to the emergency department today from occupational therapy because of concerns for high blood pressure and headache.  The patient says that the headache was all over.  Initially was associated with some blurry vision although that has resolved.  He continues to have headache at the time my exam but it has improved.  Patient denies any unusual activity.  Says the headache started when he woke up this morning.     Physical Exam   Triage Vital Signs: ED Triage Vitals  Encounter Vitals Group     BP 01/27/23 1426 105/61     Systolic BP Percentile --      Diastolic BP Percentile --      Pulse Rate 01/27/23 1426 96     Resp 01/27/23 1426 17     Temp 01/27/23 1426 98.4 F (36.9 C)     Temp Source 01/27/23 1426 Oral     SpO2 01/27/23 1426 98 %     Weight 01/27/23 1427 222 lb (100.7 kg)     Height 01/27/23 1427 5\' 10"  (1.778 m)     Head Circumference --      Peak Flow --      Pain Score 01/27/23 1426 8     Pain Loc --      Pain Education --      Exclude from Growth Chart --     Most recent vital signs: Vitals:   01/27/23 1426  BP: 105/61  Pulse: 96  Resp: 17  Temp: 98.4 F (36.9 C)  SpO2: 98%   General: Awake, alert, oriented. CV:  Good peripheral perfusion. Regular rate and rhythm. Resp:  Normal effort. Lungs clear. Abd:  No distention.  Other:  PERRL. EOMI.    ED Results / Procedures / Treatments   Labs (all labs ordered are listed, but only abnormal results are displayed) Labs Reviewed  CBC - Abnormal; Notable for the following components:      Result Value   Hemoglobin 12.4 (*)    MCH 25.9 (*)    All other components within normal limits  BASIC METABOLIC PANEL  URINALYSIS, ROUTINE W REFLEX MICROSCOPIC  CBG  MONITORING, ED  CBG MONITORING, ED     EKG  I, Phineas Semen, attending physician, personally viewed and interpreted this EKG  EKG Time: 1430 Rate: 91 Rhythm: normal sinus rhythm Axis: normal Intervals: qtc 423 QRS: narrow, q waves III, V1 ST changes: no st elevation Impression: abnormal ekg   RADIOLOGY I independently interpreted and visualized the CT head. My interpretation: No bleed Radiology interpretation:  IMPRESSION:  1. No acute intracranial abnormality.  2. Chronic small vessel ischemic disease and cerebral atrophy.  3. 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 See report  from MRI head 01/06/2023 for recommendations.    PROCEDURES:  Critical Care performed: No   MEDICATIONS ORDERED IN ED: Medications - No data to display   IMPRESSION / MDM / ASSESSMENT AND PLAN / ED COURSE  I reviewed the triage vital signs and the nursing notes.                              Differential diagnosis  includes, but is not limited to, ICH, high blood pressure, sinusitis  Patient's presentation is most consistent with acute presentation with potential threat to life or bodily function.   The patient is on the cardiac monitor to evaluate for evidence of arrhythmia and/or significant heart rate changes.  Patient presents to the emergency department today because of concerns for high blood pressure and headache.  At the time my exam patient does state he feels better.  Blood pressure has improved.  Blurry vision has improved.  Will obtain CT to evaluate for any intracranial abnormality.  Will give headache medication to try to continue improvement.  CT scan without concerning abnormality.  Patient did feel better after medications.  Will plan on discharge and follow-up with primary care.      FINAL CLINICAL IMPRESSION(S) / ED DIAGNOSES   Final diagnoses:  Nonintractable headache, unspecified chronicity pattern, unspecified headache type     Note:  This  document was prepared using Dragon voice recognition software and may include unintentional dictation errors.    Phineas Semen, MD 01/27/23 2120

## 2023-01-27 NOTE — Therapy (Addendum)
[facility] Outpatient Rehabilitation at [facility] [address] [place], [place], [address] Phone: [phone]   Fax:  [phone]  Patient Details  Name: Mark Durham MRN: [id number] Date of Birth: 08-Jun-1951 Referring Provider:  Hope Pigeon, MD  Encounter Date: 01/27/2023  Pt. arrived for therapy and started working however started complaining for having a severe headache and stated that he has noticed a change in his vision. Pt. BP was taken and was 132/74 with an elevated HR of 97. Pt. Was asked if he would like to go get checked out and escorted to the ER for further evaluation.   Herma Carson, Student-OT 01/27/2023, 5:28 PM  This entire session was performed under the direct supervision and direction of a licensed therapist. I have personally read, edited, and approve of the note as written.  Olegario Messier, MS, OTR/L   [facility] Rehabilitation at [facility] [address] [place], [place], [address] Phone: [phone]   Fax:  [phone]

## 2023-02-01 ENCOUNTER — Encounter: Payer: BC Managed Care – PPO | Admitting: Speech Pathology

## 2023-02-01 ENCOUNTER — Ambulatory Visit: Payer: BC Managed Care – PPO | Admitting: Physical Therapy

## 2023-02-01 ENCOUNTER — Ambulatory Visit: Payer: BC Managed Care – PPO | Admitting: Occupational Therapy

## 2023-02-01 DIAGNOSIS — M6281 Muscle weakness (generalized): Secondary | ICD-10-CM

## 2023-02-01 DIAGNOSIS — H539 Unspecified visual disturbance: Secondary | ICD-10-CM

## 2023-02-01 DIAGNOSIS — R278 Other lack of coordination: Secondary | ICD-10-CM | POA: Diagnosis not present

## 2023-02-01 DIAGNOSIS — R2681 Unsteadiness on feet: Secondary | ICD-10-CM

## 2023-02-01 DIAGNOSIS — R262 Difficulty in walking, not elsewhere classified: Secondary | ICD-10-CM

## 2023-02-01 NOTE — Therapy (Unsigned)
OUTPATIENT PHYSICAL THERAPY NEURO TREATMENT/  PHYSICAL THERAPY PROGRESS NOTE   Dates of reporting period  12/13/2022   to   02/01/2023       Patient Name: Mark Durham MRN: [id number] DOB:04-07-1951, 72 y.o., male Today's Date: 02/01/2023   PCP: [facility] REFERRING PROVIDER: Hope Pigeon MD   END OF SESSION:  PT End of Session - 02/01/23 1451     Visit Number 20    Number of Visits 41    Date for PT Re-Evaluation 04/11/23    Authorization Type [employer] COMM PRO    Authorization Time Period 11/03/22-01/26/23    PT Start Time 1447    PT Stop Time 1530    PT Time Calculation (min) 43 min    Equipment Utilized During Treatment Gait belt    Activity Tolerance Patient tolerated treatment well    Behavior During Therapy WFL for tasks assessed/performed              Past Medical History:  Diagnosis Date   CVA (cerebral vascular accident) (HCC)    Hypertension    Past Surgical History:  Procedure Laterality Date   TOTAL HIP ARTHROPLASTY Right    Patient Active Problem List   Diagnosis Date Noted   Sepsis due to pneumonia (HCC) 01/09/2023   Severe sepsis (HCC) 01/09/2023   UTI (urinary tract infection) 01/09/2023   Stroke (HCC) 01/09/2023   HTN (hypertension) 01/09/2023   HLD (hyperlipidemia) 01/09/2023   Hypokalemia 01/09/2023   Tobacco use 01/09/2023   Obesity (BMI 30-39.9) 01/09/2023   CAP (community acquired pneumonia) 01/09/2023   Hypomagnesemia 01/09/2023   Hypophosphatemia 01/09/2023   TIA (transient ischemic attack) 01/05/2023   GERD (gastroesophageal reflux disease) 02/26/2021   S/P hip replacement, right 03/04/2020   Primary osteoarthritis of right hip 01/24/2020   Chronic pain of right knee 11/23/2019   Chronic right hip pain 11/23/2019   Osteoarthritis of knee 11/13/2018   History of adenomatous polyp of colon 02/14/2018   History of Barrett's esophagus 02/14/2018    ONSET DATE: January 2024  REFERRING DIAG: weakness due to  stroke   THERAPY DIAG:  Muscle weakness (generalized)  Other lack of coordination  Vision disturbance following CVA (cerebrovascular accident)  Difficulty in walking, not elsewhere classified  Unsteadiness on feet  Visual disturbance following cerebrovascular accident (CVA)  Rationale for Evaluation and Treatment: Rehabilitation  SUBJECTIVE:                                                                                                                                                                                             SUBJECTIVE STATEMENT:  Pt reports doing well today. No new updates since prior session.   Pt accompanied by: significant other  PERTINENT HISTORY: Patient presents to physical therapy for weakness s/p stroke . 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 Per documentation he had a secondary stroke during the hospital stay as well as three seizures. Patient was admitted to [facility] Acute inpatient rehabilitation from 08/10/22-08/31/22 where he was discharged home. Patient was discharged from Home health on 10/28/22 (PT, OT, and ST). PMH includes GERD, OA, R knee/hip pain, Barrett's esophagus. Is using a walker, still drags right foot. Needs assistance for transfers but can bath and dress self.   PAIN:  Are you having pain? No  PRECAUTIONS: Fall  WEIGHT BEARING RESTRICTIONS: No  FALLS: Has patient fallen in last 6 months? No    PATIENT GOALS: walking with less AD, get back to mowing the yard, gardening, grilling     OBJECTIVE:   TODAY'S TREATMENT: DATE: 02/01/23  PT instruction pt in Progress note assessment to measure progress towards LTG. See below for details.   Throughout session, cues from PT for improved step length on the RLE as well as improved visual scanning to the R to avoid obstacles. Constant cues to improve position of AD to prevent anterior LOB.     PATIENT EDUCATION: Education details: goals, POC,  HEP Pt educated throughout session about proper posture and technique with exercises. Improved exercise technique, movement at target joints, use of target muscles after min to mod verbal, visual, tactile cues.  Person educated: Patient and Spouse Education method: Explanation, Demonstration, Tactile cues, and Verbal cues Education comprehension: verbalized understanding, returned demonstration, verbal cues required, and tactile cues required  HOME EXERCISE PROGRAM:  Access Code: [email or web address] URL: [email or web address] Date: 12/23/2022 Prepared by: Grier Rocher  Exercises - Standing Tandem Balance with Counter Support  - 1 x daily - 5 x weekly - 3 sets - 4 reps - 10 hold - Standing Single Leg Stance with Counter Support  - 1 x daily - 5 x weekly - 3 sets - 4 reps - 10 hold  Access Code: [email or web address] URL: [email or web address] Date: 11/03/2022 Prepared by: Precious Bard  Exercises - Seated March  - 1 x daily - 7 x weekly - 2 sets - 10 reps - 5 hold - Seated Long Arc Quad  - 1 x daily - 7 x weekly - 2 sets - 10 reps - 5 hold - Seated Heel Toe Raises  - 1 x daily - 7 x weekly - 2 sets - 10 reps - 5 hold - Seated Hip Abduction  - 1 x daily - 7 x weekly - 2 sets - 10 reps - 5 hold - Seated Isometric Hip Adduction with Ball  - 1 x daily - 7 x weekly - 2 sets - 10 reps - 5 hold  GOALS: Goals reviewed with patient? Yes  SHORT TERM GOALS: Target date: 01/17/2023  Patient will be independent in home exercise program to improve strength/mobility for better functional independence with ADLs. Baseline:4/24: HEP given  Goal status: IN PROGRESS    LONG TERM GOALS: Target date: 01/26/2023  Patient will increase FOTO score to equal to or greater than  56 to demonstrate statistically significant improvement in mobility and quality of life.   Baseline: 40 6/3: 41 7/8:54 7/23: 52 Goal status: REVISED  2.  Patient (> 41 years old) will complete five times sit to stand  test in < 15 seconds indicating an increased  LE strength and improved balance. Baseline: 4/24: 38.37 with BUE support 6/3: 12.02 sec with UE support 7/8: 18.12 sec without UE assist 7/23: 16.93 without UE assist  Goal status: IN PROGRESS  3.  Patient will increase 10 meter walk test to >1.48m/s as to improve gait speed for better community ambulation and to reduce fall risk. Baseline: 4/24: 19.2 seconds with RW; dragging of R foot  6/3: 17.175 with no AD and CGA from PT.  7/8: 14.6 sec without AD (0.29m/s) 7/23: 13.21sec without AD (0.7m/s)   Goal status: IN PROGRESS  4.  Patient will increase Berg Balance score by > 45points to demonstrate decreased fall risk during functional activities. Baseline: 4/24: see next session 5/2: 30/56 6/3: 31/56 7/8 42/56  7/23:41/56  Goal status: REVISED In progress    5.  Patient will increase 2 min walk test  >300 ft as to demonstrate reduced fall risk and improved dynamic gait balance for better safety with community/home ambulation.   Baseline: 201 7/23: 245 with min assist due to heavy R foot drag for last 36ft  Goal status: REVISED in progress,      ASSESSMENT:  CLINICAL IMPRESSION:  Patient appeared ready for treatment on this day. Pt noted to have improved balance and perceived  functional mobility with increased score on Foto 2 min walk test, , and 5xSTS. Pt was noted to have severe foot drag on the RLE for the last 69ft of 2 min wlak test, requiring min assist to prevent fall. Patient's condition has the potential to improve in response to therapy. Maximum improvement is yet to be obtained. The anticipated improvement is attainable and reasonable in a generally predictable time.  Pt will continue to benefit from skilled therapy to address remaining deficits in order to improve overall QoL and return to PLOF.      OBJECTIVE IMPAIRMENTS: Abnormal gait, cardiopulmonary status limiting activity, decreased activity tolerance,  decreased balance, decreased cognition, decreased coordination, decreased endurance, decreased knowledge of condition, decreased knowledge of use of DME, decreased mobility, difficulty walking, decreased strength, decreased safety awareness, impaired perceived functional ability, impaired flexibility, impaired UE functional use, impaired vision/preception, improper body mechanics, and postural dysfunction.   ACTIVITY LIMITATIONS: carrying, lifting, bending, sitting, standing, squatting, sleeping, stairs, transfers, bed mobility, bathing, toileting, reach over head, hygiene/grooming, locomotion level, and caring for others  PARTICIPATION LIMITATIONS: meal prep, cleaning, laundry, medication management, personal finances, interpersonal relationship, driving, shopping, community activity, and yard work  PERSONAL FACTORS: Age, Past/current experiences, Time since onset of injury/illness/exacerbation, Transportation, and 3+ comorbidities: GERD, OA, R knee/hip pain, Barrett's esophagus  are also affecting patient's functional outcome.   REHAB POTENTIAL: Good  CLINICAL DECISION MAKING: Evolving/moderate complexity  EVALUATION COMPLEXITY: Moderate  PLAN:  PT FREQUENCY: 2x/week  PT DURATION: 12 weeks  PLANNED INTERVENTIONS: Therapeutic exercises, Therapeutic activity, Neuromuscular re-education, Balance training, Gait training, Patient/Family education, Self Care, Joint mobilization, Joint manipulation, Stair training, Vestibular training, Canalith repositioning, Visual/preceptual remediation/compensation, Orthotic/Fit training, Electrical stimulation, Wheelchair mobility training, Spinal mobilization, Cryotherapy, Moist heat, Splintting, Taping, Ultrasound, Ionotophoresis 4mg /ml Dexamethasone, Manual therapy, and Re-evaluation  PLAN FOR NEXT SESSION:   BLE strengthening. Functional gait training.   Grier Rocher PT, DPT  Physical Therapist - [place]  [facility]  5:23  PM 02/01/23

## 2023-02-01 NOTE — Therapy (Cosign Needed)
OUTPATIENT OCCUPATIONAL THERAPY TREATMENT NOTE    Patient Name: Mark Durham MRN: [id number] DOB:May 12, 1951, 72 y.o., male Today's Date: 02/01/2023  PCP: [facility] REFERRING PROVIDER: Hope Pigeon, MD  END OF SESSION:  OT End of Session - 02/01/23 1623     Visit Number 11    Number of Visits 24    Date for OT Re-Evaluation 04/11/23    OT Start Time 0330    OT Stop Time 0415    OT Time Calculation (min) 45 min    Equipment Utilized During Treatment transport chair    Activity Tolerance Patient tolerated treatment well    Behavior During Therapy WFL for tasks assessed/performed               Past Medical History:  Diagnosis Date   CVA (cerebral vascular accident) (HCC)    Hypertension    Past Surgical History:  Procedure Laterality Date   TOTAL HIP ARTHROPLASTY Right    Patient Active Problem List   Diagnosis Date Noted   Sepsis due to pneumonia (HCC) 01/09/2023   Severe sepsis (HCC) 01/09/2023   UTI (urinary tract infection) 01/09/2023   Stroke (HCC) 01/09/2023   HTN (hypertension) 01/09/2023   HLD (hyperlipidemia) 01/09/2023   Hypokalemia 01/09/2023   Tobacco use 01/09/2023   Obesity (BMI 30-39.9) 01/09/2023   CAP (community acquired pneumonia) 01/09/2023   Hypomagnesemia 01/09/2023   Hypophosphatemia 01/09/2023   TIA (transient ischemic attack) 01/05/2023   GERD (gastroesophageal reflux disease) 02/26/2021   S/P hip replacement, right 03/04/2020   Primary osteoarthritis of right hip 01/24/2020   Chronic pain of right knee 11/23/2019   Chronic right hip pain 11/23/2019   Osteoarthritis of knee 11/13/2018   History of adenomatous polyp of colon 02/14/2018   History of Barrett's esophagus 02/14/2018   ONSET DATE:  07/25/2022  REFERRING DIAG:  CVA  THERAPY DIAG:  Muscle weakness (generalized)  Other lack of coordination  Visual disturbance following cerebrovascular accident (CVA)  Rationale for Evaluation and Treatment:  Rehabilitation  SUBJECTIVE:  SUBJECTIVE STATEMENT: Pt reports his headache is feeling much better and reports that the Dr. said he had a chronic migraine and gave him medicine.   Accompanied by: spouse  PERTINENT HISTORY:   02/01/2023 Addendum Pt. Had recent ED visit from OT on 01/27/2023 due to concerns for high blood pressure and headache. Initially was associated with blurry vision although he resolved at ED. CT scan without concerning abnormality and Pt. Was given medications and discharged.    01/17/2023 Addendum:  Pt. had 2 recent ED visits with hospitalizations. On 01/05/23 Pt. was hospitalized due sudden headache, slurred speech, and right sided facial drop. CT scan of the head showed no acute intracranial abnormality however shoes chronic small vessel ischemic disease. CT angio of the head did not show any LVO or other emergent finding. 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 MRI resulted in no acute finding however showed "Asymmetric T2/FLAIR signal intensity involving the right transverse and sigmoid sinuses as well as the right jugular bulb". Pt. was hospitalized again 01/09/2023 due to severe sepsis and was discharged 01/12/2023.  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 Per chart review, Pt. had a secondary stroke during the hospital stay as well as three seizures. Patient was admitted to [facility] Acute inpatient rehabilitation from 08/10/22-08/31/22 where he was discharged home. Patient was  discharged from Home health on 10/28/22 (PT, OT, and ST). PMH includes: GERD, OA, R knee/hip pain, Barrett's esophagus. Pt. Had recent ED visit on 01/05/23 due sudden headache, slurred speech, and right sided facial drop. CT scan of the head showed no acute intracranial abnormality however shoes chronic small vessel ischemic disease. CT  angio of the head did not show any LVO or other emergent finding. 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 MRI resulted in no acute finding however showed "Asymmetric T2/FLAIR signal intensity involving the right transverse and sigmoid sinuses as well as the right jugular bulb". Pt. Was hospitalized 01/09/2023 due to severe sepsis and was discharged 01/12/2023.  PRECAUTIONS: None  WEIGHT BEARING RESTRICTIONS: No  PAIN:  Are you having pain? No  FALLS: Has patient fallen in last 6 months? No  LIVING ENVIRONMENT: Lives with: lives with their spouse Lives in: House/apartment Stairs: 5 steps to enter; one level home Has following equipment at home: Environmental consultant - 2 wheeled, Wheelchair (manual), shower chair, and bed side commode  PLOF: Independent  PATIENT GOALS: To get to walking  OBJECTIVE:   HAND DOMINANCE: Right  ADLs: Transfers/ambulation related to ADLs: Eating: Independent with utensils, assist with cutting food.  Grooming: Independent oral care, wife assists with shaving. UB Dressing: Independent LB Dressing: independent Toileting: Independent Bathing: Once in the shower Supervision with bathing Tub Shower transfers: CGA with showere chair  IADLs: Shopping: Wife performs Light housekeeping: Wife performs most IADLs Meal Prep:  Meal preparation Community mobility: Relies on family and friends for driving. Medication management: Wife sets up, and provides medication Financial management: Wife completes Handwriting:  Pt. Wife reports Difficulty with identifying letters, and numbers   MOBILITY STATUS: Uses a rolling walker  POSTURE COMMENTS:  No Significant postural limitations  ACTIVITY TOLERANCE: Activity tolerance:  Fair  FUNCTIONAL OUTCOME MEASURES: FOTO: 53  TR score: 55 01/17/23: FOTO: 61 TR score: 55  UPPER EXTREMITY ROM:    Active ROM  Right Eval [facility] Left Eval [facility] Right 01/17/2023 [facility] R 01/17/2023 [facility]  Shoulder flexion      Shoulder abduction      Shoulder adduction      Shoulder extension      Shoulder internal rotation      Shoulder external rotation      Elbow flexion      Elbow extension      Wrist flexion      Wrist extension      Wrist ulnar deviation      Wrist radial deviation      Wrist pronation      Wrist supination      (Blank rows = not tested)  UPPER EXTREMITY MMT:     MMT Right Eval 4/5 Left Eval 4+/5 Right 01/17/23 Left 01/17/23  Shoulder flexion 4-/5 4+/5 4+/5 4+/5  Shoulder abduction  l 4/5 4+/5  Shoulder adduction      Shoulder extension      Shoulder internal rotation      Shoulder external rotation      Middle trapezius      Lower trapezius      Elbow flexion 4+/5 5/5 4+/5 5/5  Elbow extension 4+/5 5/5 4+/5 5/5  Wrist flexion      Wrist extension 4/5 4+/5 4+/5 4+/5  Wrist ulnar deviation      Wrist radial deviation      Wrist pronation  Wrist supination      (Blank rows = not tested)  HAND FUNCTION: Grip strength: Right: 35 lbs; Left: 25 lbs, Lateral pinch: Right: 13 lbs, Left: 11 lbs, and 3 point pinch: Right: 11 lbs, Left: Pt. Unable to assume 3pt. Pinch position 01/17/2023: Grip strength: Right: 45 lbs; Left: 42 lbs, Lateral pinch: Right: 23 lbs, Left: 22 lbs, and 3 point pinch: Right: 18 lbs, Left: 18  COORDINATION: 9 Hole Peg test: Right: placement/removal of 5 pegs in 1 min. & 5 sec; Left: 50 sec Pt. Has difficulty seeing items in the dish on the right side. Pt. Kept moving the vertical pegs between holes with the right 01/17/23: 9 Hole Peg test: Right:1 minute 6 seconds Left: 41 sec Pt. Has difficulty seeing items in the dish on the right side.  SENSATION: Light touch: WFL Proprioception: Impaired   EDEMA: Intact   MUSCLE TONE: RUE: Mild flexor tone. Wife reports right hand presents with flexion in the right hand  COGNITION: Overall cognitive status:  Pt.'s  wife reports noticing the Pt. has difficulty with numbers, and letters.  VISION:    Wife reports the Pt. frequently misses items on the right, and bumping into obstacle on the right   VISION ASSESSMENT: To be further assessed in functional context  11/24/22: impaired smooth pursuits all quadrants, requiring additional head turns; impaired saccades (slow pace, requiring head turns).  Visual fields appear grossly intact, but difficult to assess d/t decreased attention and ability to consistently follow a 2 step command. Moderate-severe R sided inattention noted.  Pt. Has a neuro optometrist appointment scheduled for June.   PERCEPTION:  TBD  PRAXIS: Impaired: Motor planning  TODAY'S TREATMENT:                                                                                                                              DATE: 02/01/2023   Therapeutic Activity:  Participation in handwriting skills writing simple words including first and last name, numbers 0-10, and first 10 letters of the alphabet. Pt required extensive verbal and visual cues for spacing and writing in a straight line. Pt. Required writing in red marker to be able to complete writing tasks within the lines. Pt. Required a copied sample above the line when he was writing the alphabet. Pt. Worked on visual scanning skills following design patterns sing the Jumbo pegboard requiring step-by-step visual and verbal cues. Pt. Worked on completing 4 simple mazes on paper.  Pt. Performed visual scanning in preparation for visual compensatory strategies during ADLs/IADL tasks.  PATIENT EDUCATION: Education details: Careers information officer Person educated: Patient, spouse Education method: explanation, vc, demo Education comprehension: pt/spouse verbalized understanding; further training needed  HOME EXERCISE PROGRAM: [place]ise theraputty; recommended solitaire for increasing R sided visual attention, handwriting practice  GOALS: Goals  reviewed with patient?  Yes   SHORT TERM GOALS: Target date: 02/28/23  Pt. Will require Supervision for bilateral UE HEPs.  Baseline: Eval: No current  HEP; 01/19/23: pt completes theraputty exercises for R hand strength/coordination; pt/spouse have been provided with activities to increase attention to R visual field, and handwriting activities.  Goal status: Ongoing  LONG TERM GOALS: Target date: 04/11/2023    Pt. Will increase FOTO score by 2 points for Pt. perceived improvement with assessment specific ADLs, and IADLs  Baseline: Eval:  FOTO score: 53 TR score: 55 01/17/2023: FOTO score: 61 TR: 55 Goal status: Met/Achieved  2.  Pt. Will increase bilateral UE strength by 2 mm grades overall to assist with ADLs, and IADLs Baseline: Eval: Right:Shoulder flexion 4/5, abduction 4-/5, elbow flexion 4+/5, extension 4+/5, wrist extension 4/5 Left Shoulder flexion 4+/5, abduction 4+/5, elbow flexion 5/5, extension 5/5, wrist extension: 4+/5 01/17/2023: Right:Shoulder flexion 4+/5, abduction 4/5, elbow flexion 4+/5, extension 4+/5, wrist extension 4+/5 Left Shoulder flexion 4+/5, abduction 4+/5, elbow flexion 5/5, extension 5/5, wrist extension: 4+/5 Goal status: Ongoing  3.  Pt. Will improve bilateral hand [facility] skills by 10 sec. In order to be able to manipulate objects during ADLs, and IADLs Baseline: Eval: Right: Pt. Placed/removed 5 pegs in 1 min, & 5 sec. Pt. Kept moving the vertical pegs between holes with the right.  Left: 50 sec; 01/17/2023: 9 Hole Peg test: Right:1 minute 6 seconds Left: 41 sec  Goal status: Ongoing  4.  Pt will consistently implement visual compensatory strategies for ADLs, and IADLs Baseline: Eval: Pt. Currently does not utilize. Education to be provided. 01/17/2023: Education provided, continue to implement Goal status: Ongoing  5. Pt. Will improve bilateral grip strength to be able to securely hold items for ADLs, and IADLs. Baseline Eval: Right: 35 lbs; Left: 25 lbs;  01/17/2023: Right: 45 lbs; Left: 42 lbs, Goal status: Ongoing  6. Pt. Will improve bilateral lateral pinch strength to be able to securely hold items for ADLs, and IADLs. Baseline: Right: 13# , Left: 11#; 01/17/23: Right: 23# , Left: 22# Goal status: Ongoing  7. Pt will print first and last name with min vc or less to be able to sign a greeting card. Baseline: 01/19/23: Able to print first name with extra time, and was able to print last name only when copying it, otherwise pt could only construct 4 of the 7 letters in his last name, and letters were not in the correct sequence. Goal status: New  ASSESSMENT:  CLINICAL IMPRESSION: Pt. Reported he was feeling much better compared to last OT session when he was taken to ED for headache and slight change in vision. Pt. Reported that he had a chronic migraine and was given medication at the ED. Pt. Reported that he feels much better and is ready to participate in therapy session today. Pt. Worked on handwriting skills today and was able to copy from the above line 10 letters of the alphabet with correct spacing however 4/10 letters went outside of the black lines. Pt. Was able to write numbers 1-10 however required increased time and verbal cues to ensure numbers stayed within the lines. Pt. Was able to complete 4 simple mazes however experienced difficulties staying in the lines when curves were present. Pt. Was able to follow the peg design from left to right and right to left however required cues to place the container on the right side of the board and increased verbal cues when figuring out which color came next in the design. Pt. continues to benefit from OT services to continue to increase attention to R visual field, improve basic handwriting skills, and review  visual, and cognitive compensatory strategies in order to improve, and maximize independence with ADLs, and IADL tasks.   PERFORMANCE DEFICITS: in functional skills including ADLs, IADLs,  coordination, dexterity, ROM, strength, Fine motor control, Gross motor control, decreased knowledge of use of DME, and UE functional use, cognitive skills including, and psychosocial skills including.   IMPAIRMENTS: are limiting patient from ADLs, IADLs, play, and leisure.   CO-MORBIDITIES: may have co-morbidities  that affects occupational performance. Patient will benefit from skilled OT to address above impairments and improve overall function.  MODIFICATION OR ASSISTANCE TO COMPLETE EVALUATION: Min-Moderate modification of tasks or assist with assess necessary to complete an evaluation.  OT OCCUPATIONAL PROFILE AND HISTORY: Detailed assessment: Review of records and additional review of physical, cognitive, psychosocial history related to current functional performance.  CLINICAL DECISION MAKING: Moderate - several treatment options, min-mod task modification necessary  REHAB POTENTIAL: Good  EVALUATION COMPLEXITY: Moderate  PLAN:  OT FREQUENCY: 2x/week  OT DURATION: 12 weeks  PLANNED INTERVENTIONS: self care/ADL training, therapeutic exercise, therapeutic activity, neuromuscular re-education, manual therapy, and functional mobility training  RECOMMENDED OTHER SERVICES:  OT and ST  CONSULTED AND AGREED WITH PLAN OF CARE: Patient  PLAN FOR NEXT SESSION: Initiate Treatment   Herma Carson, OTS 5:18 PM, 02/01/2023  Olegario Messier, MS, OTR/L  02/02/2023

## 2023-02-03 ENCOUNTER — Ambulatory Visit: Payer: BC Managed Care – PPO | Admitting: Physical Therapy

## 2023-02-03 ENCOUNTER — Encounter: Payer: BC Managed Care – PPO | Admitting: Speech Pathology

## 2023-02-03 DIAGNOSIS — I69398 Other sequelae of cerebral infarction: Secondary | ICD-10-CM

## 2023-02-03 DIAGNOSIS — R278 Other lack of coordination: Secondary | ICD-10-CM

## 2023-02-03 DIAGNOSIS — R2681 Unsteadiness on feet: Secondary | ICD-10-CM

## 2023-02-03 DIAGNOSIS — R262 Difficulty in walking, not elsewhere classified: Secondary | ICD-10-CM

## 2023-02-03 DIAGNOSIS — M6281 Muscle weakness (generalized): Secondary | ICD-10-CM

## 2023-02-03 NOTE — Therapy (Signed)
OUTPATIENT PHYSICAL THERAPY NEURO TREATMENT     Patient Name: Mark Durham MRN: [id number] DOB:[birth year], 72 y.o., male Today's Date: 02/03/2023   PCP: [facility] REFERRING PROVIDER: Hope Pigeon MD   END OF SESSION:  PT End of Session - 02/03/23 1508     Visit Number 21    Number of Visits 41    Date for PT Re-Evaluation 04/11/23    Authorization Type [employer] COMM PRO    Authorization Time Period 11/03/22-01/26/23    PT Start Time 1446    PT Stop Time 1526    PT Time Calculation (min) 40 min    Equipment Utilized During Treatment Gait belt    Activity Tolerance Patient tolerated treatment well    Behavior During Therapy WFL for tasks assessed/performed              Past Medical History:  Diagnosis Date   CVA (cerebral vascular accident) (HCC)    Hypertension    Past Surgical History:  Procedure Laterality Date   TOTAL HIP ARTHROPLASTY Right    Patient Active Problem List   Diagnosis Date Noted   Sepsis due to pneumonia (HCC) 01/09/2023   Severe sepsis (HCC) 01/09/2023   UTI (urinary tract infection) 01/09/2023   Stroke (HCC) 01/09/2023   HTN (hypertension) 01/09/2023   HLD (hyperlipidemia) 01/09/2023   Hypokalemia 01/09/2023   Tobacco use 01/09/2023   Obesity (BMI 30-39.9) 01/09/2023   CAP (community acquired pneumonia) 01/09/2023   Hypomagnesemia 01/09/2023   Hypophosphatemia 01/09/2023   TIA (transient ischemic attack) 01/05/2023   GERD (gastroesophageal reflux disease) 02/26/2021   S/P hip replacement, right 03/04/2020   Primary osteoarthritis of right hip 01/24/2020   Chronic pain of right knee 11/23/2019   Chronic right hip pain 11/23/2019   Osteoarthritis of knee 11/13/2018   History of adenomatous polyp of colon 02/14/2018   History of Barrett's esophagus 02/14/2018    ONSET DATE: January 2024  REFERRING DIAG: weakness due to stroke   THERAPY DIAG:  Muscle weakness (generalized)  Other lack of coordination  Visual  disturbance following cerebrovascular accident (CVA)  Vision disturbance following CVA (cerebrovascular accident)  Difficulty in walking, not elsewhere classified  Unsteadiness on feet  Rationale for Evaluation and Treatment: Rehabilitation  SUBJECTIVE:                                                                                                                                                                                             SUBJECTIVE STATEMENT:  Pt reports doing well today. No new updates since prior session.   Pt accompanied by: significant other  PERTINENT HISTORY:  Patient presents to physical therapy for weakness s/p stroke . 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 Per documentation he had a secondary stroke during the hospital stay as well as three seizures. Patient was admitted to [facility] inpatient rehabilitation from 08/10/22-08/31/22 where he was discharged home. Patient was discharged from Home health on 10/28/22 (PT, OT, and ST). PMH includes GERD, OA, R knee/hip pain, Barrett's esophagus. Is using a walker, still drags right foot. Needs assistance for transfers but can bath and dress self.   PAIN:  Are you having pain? No  PRECAUTIONS: Fall  WEIGHT BEARING RESTRICTIONS: No  FALLS: Has patient fallen in last 6 months? No    PATIENT GOALS: walking with less AD, get back to mowing the yard, gardening, grilling     OBJECTIVE:   TODAY'S TREATMENT: DATE: 02/03/23  Nustep reciprocal movement training x 5 min level 2-3. Cues for full range of motion on the right lower extremity intermittently throughout endurance retraining.  Patient responded well to instruction.  Forward Step up on 6inch step x 10 bil. Lateral step up on 6 inch step x 10 bil. Moderate cues for step width on the RLE.  Forward gait in parallel bars through agilitly latter. X 4 laps.  Lateral stepping through agility ladder x 4 R and L    Standing on airex pad:  x30 sec no UE support Ball raise overhead with BUE x 10  Ball tap on parallel bars alternating R and L x 12.   Throughout session PT provided contact-guard assist to min assist for improved safety and improve weight shift allowing increased step length and width on the right lower extremity when fatigued.  Patient required constant cues for increased step with with any lateral movement.  PATIENT EDUCATION: Education details: goals, POC, HEP Pt educated throughout session about proper posture and technique with exercises. Improved exercise technique, movement at target joints, use of target muscles after min to mod verbal, visual, tactile cues.  Person educated: Patient and Spouse Education method: Explanation, Demonstration, Tactile cues, and Verbal cues Education comprehension: verbalized understanding, returned demonstration, verbal cues required, and tactile cues required  HOME EXERCISE PROGRAM:  Access Code: [email or web address] URL: [email or web address] Date: 12/23/2022 Prepared by: Grier Rocher  Exercises - Standing Tandem Balance with Counter Support  - 1 x daily - 5 x weekly - 3 sets - 4 reps - 10 hold - Standing Single Leg Stance with Counter Support  - 1 x daily - 5 x weekly - 3 sets - 4 reps - 10 hold  Access Code: [email or web address] URL: [email or web address] Date: 11/03/2022 Prepared by: Precious Bard  Exercises - Seated March  - 1 x daily - 7 x weekly - 2 sets - 10 reps - 5 hold - Seated Long Arc Quad  - 1 x daily - 7 x weekly - 2 sets - 10 reps - 5 hold - Seated Heel Toe Raises  - 1 x daily - 7 x weekly - 2 sets - 10 reps - 5 hold - Seated Hip Abduction  - 1 x daily - 7 x weekly - 2 sets - 10 reps - 5 hold - Seated Isometric Hip Adduction with Ball  - 1 x daily - 7 x weekly - 2 sets - 10 reps - 5 hold  GOALS: Goals reviewed with patient? Yes  SHORT TERM GOALS: Target date: 01/17/2023  Patient will be independent in home  exercise program to improve strength/mobility for better functional independence  with ADLs. Baseline:4/24: HEP given  Goal status: IN PROGRESS    LONG TERM GOALS: Target date: 01/26/2023  Patient will increase FOTO score to equal to or greater than  56 to demonstrate statistically significant improvement in mobility and quality of life.   Baseline: 40 6/3: 41 7/8:54 7/23: 52 Goal status: REVISED  2.  Patient (> 82 years old) will complete five times sit to stand test in < 15 seconds indicating an increased LE strength and improved balance. Baseline: 4/24: 38.37 with BUE support 6/3: 12.02 sec with UE support 7/8: 18.12 sec without UE assist 7/23: 16.93 without UE assist  Goal status: IN PROGRESS  3.  Patient will increase 10 meter walk test to >1.73m/s as to improve gait speed for better community ambulation and to reduce fall risk. Baseline: 4/24: 19.2 seconds with RW; dragging of R foot  6/3: 17.175 with no AD and CGA from PT.  7/8: 14.6 sec without AD (0.84m/s) 7/23: 13.21sec without AD (0.8m/s)   Goal status: IN PROGRESS  4.  Patient will increase Berg Balance score by > 45points to demonstrate decreased fall risk during functional activities. Baseline: 4/24: see next session 5/2: 30/56 6/3: 31/56 7/8 42/56  7/23:41/56  Goal status: REVISED In progress    5.  Patient will increase 2 min walk test  >300 ft as to demonstrate reduced fall risk and improved dynamic gait balance for better safety with community/home ambulation.   Baseline: 201 7/23: 245 with min assist due to heavy R foot drag for last 39ft  Goal status: REVISED in progress,      ASSESSMENT:  CLINICAL IMPRESSION:  Patient appeared ready for treatment on this day.  PT treatment focused on dynamic balance and improved step length and width on the right lower extremity.  Patient noted to require constant instruction for improved attention to the right lower extremity with lateral movement, as well as  improved visual scanning to locate targets on the right.  Patient responded well to therapeutic intervention on this day, demonstrating increased step width with increased repetition throughout session.  Pt will continue to benefit from skilled therapy to address remaining deficits in order to improve overall QoL and return to PLOF.      OBJECTIVE IMPAIRMENTS: Abnormal gait, cardiopulmonary status limiting activity, decreased activity tolerance, decreased balance, decreased cognition, decreased coordination, decreased endurance, decreased knowledge of condition, decreased knowledge of use of DME, decreased mobility, difficulty walking, decreased strength, decreased safety awareness, impaired perceived functional ability, impaired flexibility, impaired UE functional use, impaired vision/preception, improper body mechanics, and postural dysfunction.   ACTIVITY LIMITATIONS: carrying, lifting, bending, sitting, standing, squatting, sleeping, stairs, transfers, bed mobility, bathing, toileting, reach over head, hygiene/grooming, locomotion level, and caring for others  PARTICIPATION LIMITATIONS: meal prep, cleaning, laundry, medication management, personal finances, interpersonal relationship, driving, shopping, community activity, and yard work  PERSONAL FACTORS: Age, Past/current experiences, Time since onset of injury/illness/exacerbation, Transportation, and 3+ comorbidities: GERD, OA, R knee/hip pain, Barrett's esophagus  are also affecting patient's functional outcome.   REHAB POTENTIAL: Good  CLINICAL DECISION MAKING: Evolving/moderate complexity  EVALUATION COMPLEXITY: Moderate  PLAN:  PT FREQUENCY: 2x/week  PT DURATION: 12 weeks  PLANNED INTERVENTIONS: Therapeutic exercises, Therapeutic activity, Neuromuscular re-education, Balance training, Gait training, Patient/Family education, Self Care, Joint mobilization, Joint manipulation, Stair training, Vestibular training, Canalith  repositioning, Visual/preceptual remediation/compensation, Orthotic/Fit training, Electrical stimulation, Wheelchair mobility training, Spinal mobilization, Cryotherapy, Moist heat, Splintting, Taping, Ultrasound, Ionotophoresis 4mg /ml Dexamethasone, Manual therapy, and Re-evaluation  PLAN FOR NEXT SESSION:  Functional gait training.  Dynamic balance training Visual scanning to the right Endurance training  Grier Rocher PT, DPT  Physical Therapist - [facility]  [facility]  3:27 PM 02/03/23

## 2023-02-08 ENCOUNTER — Encounter: Payer: BC Managed Care – PPO | Admitting: Speech Pathology

## 2023-02-08 ENCOUNTER — Ambulatory Visit: Payer: BC Managed Care – PPO | Admitting: Physical Therapy

## 2023-02-08 ENCOUNTER — Ambulatory Visit: Payer: BC Managed Care – PPO | Admitting: Occupational Therapy

## 2023-02-08 DIAGNOSIS — M6281 Muscle weakness (generalized): Secondary | ICD-10-CM

## 2023-02-08 DIAGNOSIS — R278 Other lack of coordination: Secondary | ICD-10-CM

## 2023-02-08 DIAGNOSIS — I69398 Other sequelae of cerebral infarction: Secondary | ICD-10-CM

## 2023-02-08 DIAGNOSIS — R262 Difficulty in walking, not elsewhere classified: Secondary | ICD-10-CM

## 2023-02-08 DIAGNOSIS — R2681 Unsteadiness on feet: Secondary | ICD-10-CM

## 2023-02-08 NOTE — Therapy (Unsigned)
OUTPATIENT OCCUPATIONAL THERAPY TREATMENT NOTE    Patient Name: Mark Durham MRN: [id number] DOB:[birth year], 72 y.o., male Today's Date: 02/08/2023  PCP: [facility] REFERRING PROVIDER: Hope Pigeon, MD  END OF SESSION:  OT End of Session - 02/08/23 1715     Visit Number 12    Number of Visits 24    Date for OT Re-Evaluation 04/11/23    Progress Note Due on Visit 10    OT Start Time 1530    OT Stop Time 1615    OT Time Calculation (min) 45 min    Equipment Utilized During Treatment transport chair    Activity Tolerance Patient tolerated treatment well    Behavior During Therapy WFL for tasks assessed/performed               Past Medical History:  Diagnosis Date   CVA (cerebral vascular accident) (HCC)    Hypertension    Past Surgical History:  Procedure Laterality Date   TOTAL HIP ARTHROPLASTY Right    Patient Active Problem List   Diagnosis Date Noted   Sepsis due to pneumonia (HCC) 01/09/2023   Severe sepsis (HCC) 01/09/2023   UTI (urinary tract infection) 01/09/2023   Stroke (HCC) 01/09/2023   HTN (hypertension) 01/09/2023   HLD (hyperlipidemia) 01/09/2023   Hypokalemia 01/09/2023   Tobacco use 01/09/2023   Obesity (BMI 30-39.9) 01/09/2023   CAP (community acquired pneumonia) 01/09/2023   Hypomagnesemia 01/09/2023   Hypophosphatemia 01/09/2023   TIA (transient ischemic attack) 01/05/2023   GERD (gastroesophageal reflux disease) 02/26/2021   S/P hip replacement, right 03/04/2020   Primary osteoarthritis of right hip 01/24/2020   Chronic pain of right knee 11/23/2019   Chronic right hip pain 11/23/2019   Osteoarthritis of knee 11/13/2018   History of adenomatous polyp of colon 02/14/2018   History of Barrett's esophagus 02/14/2018   ONSET DATE:  07/25/2022  REFERRING DIAG:  CVA  THERAPY DIAG:  Muscle weakness (generalized)  Other lack of coordination  Visual disturbance following cerebrovascular accident (CVA)  Rationale for  Evaluation and Treatment: Rehabilitation  SUBJECTIVE:  SUBJECTIVE STATEMENT: Pt reports his headache is feeling much better and reports that the Dr. said he had a chronic migraine and gave him medicine.   Accompanied by: spouse  PERTINENT HISTORY:   02/01/2023 Addendum Pt. Had recent ED visit from OT on 01/27/2023 due to concerns for high blood pressure and headache. Initially was associated with blurry vision although he resolved at ED. CT scan without concerning abnormality and Pt. Was given medications and discharged.    01/17/2023 Addendum:  Pt. had 2 recent ED visits with hospitalizations. On 01/05/23 Pt. was hospitalized due sudden headache, slurred speech, and right sided facial drop. CT scan of the head showed no acute intracranial abnormality however shoes chronic small vessel ischemic disease. CT angio of the head did not show any LVO or other emergent finding. 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 MRI resulted in no acute finding however showed "Asymmetric T2/FLAIR signal intensity involving the right transverse and sigmoid sinuses as well as the right jugular bulb". Pt. was hospitalized again 01/09/2023 due to severe sepsis and was discharged 01/12/2023.  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 Per chart review, Pt. had a secondary stroke during the hospital stay as well as three seizures. Patient was admitted to [facility] inpatient rehabilitation  from 08/10/22-08/31/22 where he was discharged home. Patient was discharged from Home health on 10/28/22 (PT, OT, and ST). PMH includes: GERD, OA, R knee/hip pain, Barrett's esophagus. Pt. Had recent ED visit on 01/05/23 due sudden headache, slurred speech, and right sided facial drop. CT scan of the head showed no acute intracranial abnormality however shoes chronic small  vessel ischemic disease. CT angio of the head did not show any LVO or other emergent finding. 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 MRI resulted in no acute finding however showed "Asymmetric T2/FLAIR signal intensity involving the right transverse and sigmoid sinuses as well as the right jugular bulb". Pt. Was hospitalized 01/09/2023 due to severe sepsis and was discharged 01/12/2023.  PRECAUTIONS: None  WEIGHT BEARING RESTRICTIONS: No  PAIN:  Are you having pain? No  FALLS: Has patient fallen in last 6 months? No  LIVING ENVIRONMENT: Lives with: lives with their spouse Lives in: House/apartment Stairs: 5 steps to enter; one level home Has following equipment at home: Environmental consultant - 2 wheeled, Wheelchair (manual), shower chair, and bed side commode  PLOF: Independent  PATIENT GOALS: To get to walking  OBJECTIVE:   HAND DOMINANCE: Right  ADLs: Transfers/ambulation related to ADLs: Eating: Independent with utensils, assist with cutting food.  Grooming: Independent oral care, wife assists with shaving. UB Dressing: Independent LB Dressing: independent Toileting: Independent Bathing: Once in the shower Supervision with bathing Tub Shower transfers: CGA with showere chair  IADLs: Shopping: Wife performs Light housekeeping: Wife performs most IADLs Meal Prep:  Meal preparation Community mobility: Relies on family and friends for driving. Medication management: Wife sets up, and provides medication Financial management: Wife completes Handwriting:  Pt. Wife reports Difficulty with identifying letters, and numbers   MOBILITY STATUS: Uses a rolling walker  POSTURE COMMENTS:  No Significant postural limitations  ACTIVITY TOLERANCE: Activity tolerance:  Fair  FUNCTIONAL OUTCOME MEASURES: FOTO: 53  TR score: 55 01/17/23: FOTO: 61 TR score: 55  UPPER  EXTREMITY ROM:    Active ROM Right Eval [facility] Left Eval [facility] Right 01/17/2023 [facility] R 01/17/2023 [facility]  Shoulder flexion      Shoulder abduction      Shoulder adduction      Shoulder extension      Shoulder internal rotation      Shoulder external rotation      Elbow flexion      Elbow extension      Wrist flexion      Wrist extension      Wrist ulnar deviation      Wrist radial deviation      Wrist pronation      Wrist supination      (Blank rows = not tested)  UPPER EXTREMITY MMT:     MMT Right Eval 4/5 Left Eval 4+/5 Right 01/17/23 Left 01/17/23  Shoulder flexion 4-/5 4+/5 4+/5 4+/5  Shoulder abduction  l 4/5 4+/5  Shoulder adduction      Shoulder extension      Shoulder internal rotation      Shoulder external rotation      Middle trapezius      Lower trapezius      Elbow flexion 4+/5 5/5 4+/5 5/5  Elbow extension 4+/5 5/5 4+/5 5/5  Wrist flexion      Wrist extension 4/5 4+/5 4+/5 4+/5  Wrist ulnar deviation      Wrist radial deviation  Wrist pronation      Wrist supination      (Blank rows = not tested)  HAND FUNCTION: Grip strength: Right: 35 lbs; Left: 25 lbs, Lateral pinch: Right: 13 lbs, Left: 11 lbs, and 3 point pinch: Right: 11 lbs, Left: Pt. Unable to assume 3pt. Pinch position 01/17/2023: Grip strength: Right: 45 lbs; Left: 42 lbs, Lateral pinch: Right: 23 lbs, Left: 22 lbs, and 3 point pinch: Right: 18 lbs, Left: 18  COORDINATION: 9 Hole Peg test: Right: placement/removal of 5 pegs in 1 min. & 5 sec; Left: 50 sec Pt. Has difficulty seeing items in the dish on the right side. Pt. Kept moving the vertical pegs between holes with the right 01/17/23: 9 Hole Peg test: Right:1 minute 6 seconds Left: 41 sec Pt. Has difficulty seeing items in the dish on the right side.  SENSATION: Light touch: WFL Proprioception: Impaired   EDEMA: Intact   MUSCLE TONE: RUE: Mild flexor tone. Wife reports right hand presents with flexion in the right  hand  COGNITION: Overall cognitive status:  Pt.'s wife reports noticing the Pt. has difficulty with numbers, and letters.  VISION:    Wife reports the Pt. frequently misses items on the right, and bumping into obstacle on the right   VISION ASSESSMENT: To be further assessed in functional context  11/24/22: impaired smooth pursuits all quadrants, requiring additional head turns; impaired saccades (slow pace, requiring head turns).  Visual fields appear grossly intact, but difficult to assess d/t decreased attention and ability to consistently follow a 2 step command. Moderate-severe R sided inattention noted.  Pt. Has a neuro optometrist appointment scheduled for June.   PERCEPTION:  TBD  PRAXIS: Impaired: Motor planning  TODAY'S TREATMENT:                                                                                                                              DATE: 02/01/2023   Therapeutic Activity:  Participation in handwriting skills writing simple words including first and last name, numbers 0-10, and first 10 letters of the alphabet. Pt required extensive verbal and visual cues for spacing and writing in a straight line. Pt. Required writing in red marker to be able to complete writing tasks within the lines. Pt. Required a copied sample above the line when he was writing the alphabet. Pt. Worked on visual scanning skills following design patterns sing the Jumbo pegboard requiring step-by-step visual and verbal cues. Pt. Worked on completing 4 simple mazes on paper.  Pt. Performed visual scanning in preparation for visual compensatory strategies during ADLs/IADL tasks.  PATIENT EDUCATION: Education details: Careers information officer Person educated: Patient, spouse Education method: explanation, vc, demo Education comprehension: pt/spouse verbalized understanding; further training needed  HOME EXERCISE PROGRAM: [place]ise theraputty; recommended solitaire for increasing R sided visual  attention, handwriting practice  GOALS: Goals reviewed with patient?  Yes   SHORT TERM GOALS: Target date: 02/28/23  Pt. Will require Supervision for bilateral  UE HEPs.  Baseline: Eval: No current HEP; 01/19/23: pt completes theraputty exercises for R hand strength/coordination; pt/spouse have been provided with activities to increase attention to R visual field, and handwriting activities.  Goal status: Ongoing  LONG TERM GOALS: Target date: 04/11/2023    Pt. Will increase FOTO score by 2 points for Pt. perceived improvement with assessment specific ADLs, and IADLs  Baseline: Eval:  FOTO score: 53 TR score: 55 01/17/2023: FOTO score: 61 TR: 55 Goal status: Met/Achieved  2.  Pt. Will increase bilateral UE strength by 2 mm grades overall to assist with ADLs, and IADLs Baseline: Eval: Right:Shoulder flexion 4/5, abduction 4-/5, elbow flexion 4+/5, extension 4+/5, wrist extension 4/5 Left Shoulder flexion 4+/5, abduction 4+/5, elbow flexion 5/5, extension 5/5, wrist extension: 4+/5 01/17/2023: Right:Shoulder flexion 4+/5, abduction 4/5, elbow flexion 4+/5, extension 4+/5, wrist extension 4+/5 Left Shoulder flexion 4+/5, abduction 4+/5, elbow flexion 5/5, extension 5/5, wrist extension: 4+/5 Goal status: Ongoing  3.  Pt. Will improve bilateral hand [facility] skills by 10 sec. In order to be able to manipulate objects during ADLs, and IADLs Baseline: Eval: Right: Pt. Placed/removed 5 pegs in 1 min, & 5 sec. Pt. Kept moving the vertical pegs between holes with the right.  Left: 50 sec; 01/17/2023: 9 Hole Peg test: Right:1 minute 6 seconds Left: 41 sec  Goal status: Ongoing  4.  Pt will consistently implement visual compensatory strategies for ADLs, and IADLs Baseline: Eval: Pt. Currently does not utilize. Education to be provided. 01/17/2023: Education provided, continue to implement Goal status: Ongoing  5. Pt. Will improve bilateral grip strength to be able to securely hold items for ADLs, and  IADLs. Baseline Eval: Right: 35 lbs; Left: 25 lbs; 01/17/2023: Right: 45 lbs; Left: 42 lbs, Goal status: Ongoing  6. Pt. Will improve bilateral lateral pinch strength to be able to securely hold items for ADLs, and IADLs. Baseline: Right: 13# , Left: 11#; 01/17/23: Right: 23# , Left: 22# Goal status: Ongoing  7. Pt will print first and last name with min vc or less to be able to sign a greeting card. Baseline: 01/19/23: Able to print first name with extra time, and was able to print last name only when copying it, otherwise pt could only construct 4 of the 7 letters in his last name, and letters were not in the correct sequence. Goal status: New  ASSESSMENT:  CLINICAL IMPRESSION: Pt. Reported he was feeling much better compared to last OT session when he was taken to ED for headache and slight change in vision. Pt. Reported that he had a chronic migraine and was given medication at the ED. Pt. Reported that he feels much better and is ready to participate in therapy session today. Pt. Worked on handwriting skills today and was able to copy from the above line 10 letters of the alphabet with correct spacing however 4/10 letters went outside of the black lines. Pt. Was able to write numbers 1-10 however required increased time and verbal cues to ensure numbers stayed within the lines. Pt. Was able to complete 4 simple mazes however experienced difficulties staying in the lines when curves were present. Pt. Was able to follow the peg design from left to right and right to left however required cues to place the container on the right side of the board and increased verbal cues when figuring out which color came next in the design. Pt. continues to benefit from OT services to continue to increase attention to R visual  field, improve basic handwriting skills, and review visual, and cognitive compensatory strategies in order to improve, and maximize independence with ADLs, and IADL tasks.   PERFORMANCE  DEFICITS: in functional skills including ADLs, IADLs, coordination, dexterity, ROM, strength, Fine motor control, Gross motor control, decreased knowledge of use of DME, and UE functional use, cognitive skills including, and psychosocial skills including.   IMPAIRMENTS: are limiting patient from ADLs, IADLs, play, and leisure.   CO-MORBIDITIES: may have co-morbidities  that affects occupational performance. Patient will benefit from skilled OT to address above impairments and improve overall function.  MODIFICATION OR ASSISTANCE TO COMPLETE EVALUATION: Min-Moderate modification of tasks or assist with assess necessary to complete an evaluation.  OT OCCUPATIONAL PROFILE AND HISTORY: Detailed assessment: Review of records and additional review of physical, cognitive, psychosocial history related to current functional performance.  CLINICAL DECISION MAKING: Moderate - several treatment options, min-mod task modification necessary  REHAB POTENTIAL: Good  EVALUATION COMPLEXITY: Moderate  PLAN:  OT FREQUENCY: 2x/week  OT DURATION: 12 weeks  PLANNED INTERVENTIONS: self care/ADL training, therapeutic exercise, therapeutic activity, neuromuscular re-education, manual therapy, and functional mobility training  RECOMMENDED OTHER SERVICES:  OT and ST  CONSULTED AND AGREED WITH PLAN OF CARE: Patient  PLAN FOR NEXT SESSION: Initiate Treatment   Herma Carson, OTS 5:18 PM, 02/08/2023

## 2023-02-08 NOTE — Therapy (Signed)
OUTPATIENT PHYSICAL THERAPY NEURO TREATMENT     Patient Name: Mark Durham MRN: [id number] DOB:[birth year], 72 y.o., male Today's Date: 02/08/2023   PCP: [facility] REFERRING PROVIDER: Hope Pigeon MD   END OF SESSION:  PT End of Session - 02/08/23 1452     Visit Number 22    Number of Visits 41    Date for PT Re-Evaluation 04/11/23    Authorization Type [employer] COMM PRO    Authorization Time Period 11/03/22-01/26/23    PT Start Time 1449    PT Stop Time 1530    PT Time Calculation (min) 41 min    Equipment Utilized During Treatment Gait belt    Activity Tolerance Patient tolerated treatment well    Behavior During Therapy WFL for tasks assessed/performed              Past Medical History:  Diagnosis Date   CVA (cerebral vascular accident) (HCC)    Hypertension    Past Surgical History:  Procedure Laterality Date   TOTAL HIP ARTHROPLASTY Right    Patient Active Problem List   Diagnosis Date Noted   Sepsis due to pneumonia (HCC) 01/09/2023   Severe sepsis (HCC) 01/09/2023   UTI (urinary tract infection) 01/09/2023   Stroke (HCC) 01/09/2023   HTN (hypertension) 01/09/2023   HLD (hyperlipidemia) 01/09/2023   Hypokalemia 01/09/2023   Tobacco use 01/09/2023   Obesity (BMI 30-39.9) 01/09/2023   CAP (community acquired pneumonia) 01/09/2023   Hypomagnesemia 01/09/2023   Hypophosphatemia 01/09/2023   TIA (transient ischemic attack) 01/05/2023   GERD (gastroesophageal reflux disease) 02/26/2021   S/P hip replacement, right 03/04/2020   Primary osteoarthritis of right hip 01/24/2020   Chronic pain of right knee 11/23/2019   Chronic right hip pain 11/23/2019   Osteoarthritis of knee 11/13/2018   History of adenomatous polyp of colon 02/14/2018   History of Barrett's esophagus 02/14/2018    ONSET DATE: January 2024  REFERRING DIAG: weakness due to stroke   THERAPY DIAG:  Muscle weakness (generalized)  Other lack of coordination  Visual  disturbance following cerebrovascular accident (CVA)  Difficulty in walking, not elsewhere classified  Unsteadiness on feet  Rationale for Evaluation and Treatment: Rehabilitation  SUBJECTIVE:                                                                                                                                                                                             SUBJECTIVE STATEMENT:  Pt reports doing well today. No new updates since prior session.   Pt accompanied by: significant other  PERTINENT HISTORY: Patient presents to physical therapy for weakness  s/p stroke . 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 Per documentation he had a secondary stroke during the hospital stay as well as three seizures. Patient was admitted to [facility] Acute inpatient rehabilitation from 08/10/22-08/31/22 where he was discharged home. Patient was discharged from Home health on 10/28/22 (PT, OT, and ST). PMH includes GERD, OA, R knee/hip pain, Barrett's esophagus. Is using a walker, still drags right foot. Needs assistance for transfers but can bath and dress self.   PAIN:  Are you having pain? No  PRECAUTIONS: Fall  WEIGHT BEARING RESTRICTIONS: No  FALLS: Has patient fallen in last 6 months? No    PATIENT GOALS: walking with less AD, get back to mowing the yard, gardening, grilling     OBJECTIVE:   TODAY'S TREATMENT: DATE: 02/08/23  PT instucted pt in circuit training: Seated therex: Hip abduction GTBx 12 x2 Hip flexion AROM x 12 x2 Ankle DF AROM x 15 x2 LAQ x 12 4# ankle weihgt x2 Sit<>stand x 10 x2 Isometric hip adduction x 12, 3 sec hold Gait with no AD performed 2 x 26ft following each circuit  with min assist from PT for improved shoulder retraction on the R side to allow adequate weight shift and advancement of RLE.  Additional gait with no AD to OT gym x 89ft with min assist for in  Throughout session PT provided contact-guard  assist to min assist for improved safety and improve weight shift allowing increased step length and width on the right lower extremity when fatigued.  Patient required constant cues for increased step with with any lateral movement.  PATIENT EDUCATION: Education details: goals, POC, HEP Pt educated throughout session about proper posture and technique with exercises. Improved exercise technique, movement at target joints, use of target muscles after min to mod verbal, visual, tactile cues.  Person educated: Patient and Spouse Education method: Explanation, Demonstration, Tactile cues, and Verbal cues Education comprehension: verbalized understanding, returned demonstration, verbal cues required, and tactile cues required  HOME EXERCISE PROGRAM:  Access Code: [email or web address] URL: [email or web address] Date: 12/23/2022 Prepared by: Grier Rocher  Exercises - Standing Tandem Balance with Counter Support  - 1 x daily - 5 x weekly - 3 sets - 4 reps - 10 hold - Standing Single Leg Stance with Counter Support  - 1 x daily - 5 x weekly - 3 sets - 4 reps - 10 hold  Access Code: [email or web address] URL: [email or web address] Date: 11/03/2022 Prepared by: Precious Bard  Exercises - Seated March  - 1 x daily - 7 x weekly - 2 sets - 10 reps - 5 hold - Seated Long Arc Quad  - 1 x daily - 7 x weekly - 2 sets - 10 reps - 5 hold - Seated Heel Toe Raises  - 1 x daily - 7 x weekly - 2 sets - 10 reps - 5 hold - Seated Hip Abduction  - 1 x daily - 7 x weekly - 2 sets - 10 reps - 5 hold - Seated Isometric Hip Adduction with Ball  - 1 x daily - 7 x weekly - 2 sets - 10 reps - 5 hold  GOALS: Goals reviewed with patient? Yes  SHORT TERM GOALS: Target date: 01/17/2023  Patient will be independent in home exercise program to improve strength/mobility for better functional independence with ADLs. Baseline:4/24: HEP given  Goal status: IN PROGRESS    LONG TERM GOALS: Target date:  01/26/2023  Patient will increase FOTO score to  equal to or greater than  56 to demonstrate statistically significant improvement in mobility and quality of life.   Baseline: 40 6/3: 41 7/8:54 7/23: 52 Goal status: REVISED  2.  Patient (> 24 years old) will complete five times sit to stand test in < 15 seconds indicating an increased LE strength and improved balance. Baseline: 4/24: 38.37 with BUE support 6/3: 12.02 sec with UE support 7/8: 18.12 sec without UE assist 7/23: 16.93 without UE assist  Goal status: IN PROGRESS  3.  Patient will increase 10 meter walk test to >1.58m/s as to improve gait speed for better community ambulation and to reduce fall risk. Baseline: 4/24: 19.2 seconds with RW; dragging of R foot  6/3: 17.175 with no AD and CGA from PT.  7/8: 14.6 sec without AD (0.22m/s) 7/23: 13.21sec without AD (0.76m/s)   Goal status: IN PROGRESS  4.  Patient will increase Berg Balance score by > 45points to demonstrate decreased fall risk during functional activities. Baseline: 4/24: see next session 5/2: 30/56 6/3: 31/56 7/8 42/56  7/23:41/56  Goal status: REVISED In progress    5.  Patient will increase 2 min walk test  >300 ft as to demonstrate reduced fall risk and improved dynamic gait balance for better safety with community/home ambulation.   Baseline: 201 7/23: 245 with min assist due to heavy R foot drag for last 40ft  Goal status: REVISED in progress,      ASSESSMENT:  CLINICAL IMPRESSION:  Patient appeared ready for treatment on this day.  PT treatment focused on BLE strengthening and focused attention to the RLE with functional gait training in rehab gym. Pt able to demonstrate full ROM for all therex and min-mod cues for attention to the RLE with fatigue. Pt will continue to benefit from skilled therapy to address remaining deficits in order to improve overall QoL and return to PLOF.      OBJECTIVE IMPAIRMENTS: Abnormal gait, cardiopulmonary status  limiting activity, decreased activity tolerance, decreased balance, decreased cognition, decreased coordination, decreased endurance, decreased knowledge of condition, decreased knowledge of use of DME, decreased mobility, difficulty walking, decreased strength, decreased safety awareness, impaired perceived functional ability, impaired flexibility, impaired UE functional use, impaired vision/preception, improper body mechanics, and postural dysfunction.   ACTIVITY LIMITATIONS: carrying, lifting, bending, sitting, standing, squatting, sleeping, stairs, transfers, bed mobility, bathing, toileting, reach over head, hygiene/grooming, locomotion level, and caring for others  PARTICIPATION LIMITATIONS: meal prep, cleaning, laundry, medication management, personal finances, interpersonal relationship, driving, shopping, community activity, and yard work  PERSONAL FACTORS: Age, Past/current experiences, Time since onset of injury/illness/exacerbation, Transportation, and 3+ comorbidities: GERD, OA, R knee/hip pain, Barrett's esophagus  are also affecting patient's functional outcome.   REHAB POTENTIAL: Good  CLINICAL DECISION MAKING: Evolving/moderate complexity  EVALUATION COMPLEXITY: Moderate  PLAN:  PT FREQUENCY: 2x/week  PT DURATION: 12 weeks  PLANNED INTERVENTIONS: Therapeutic exercises, Therapeutic activity, Neuromuscular re-education, Balance training, Gait training, Patient/Family education, Self Care, Joint mobilization, Joint manipulation, Stair training, Vestibular training, Canalith repositioning, Visual/preceptual remediation/compensation, Orthotic/Fit training, Electrical stimulation, Wheelchair mobility training, Spinal mobilization, Cryotherapy, Moist heat, Splintting, Taping, Ultrasound, Ionotophoresis 4mg /ml Dexamethasone, Manual therapy, and Re-evaluation  PLAN FOR NEXT SESSION:    Dynamic balance training Visual scanning to the right Endurance training  Grier Rocher PT, DPT   Physical Therapist - [facility]  4:09 PM 02/08/23

## 2023-02-10 ENCOUNTER — Ambulatory Visit: Payer: BC Managed Care – PPO

## 2023-02-10 ENCOUNTER — Ambulatory Visit: Payer: BC Managed Care – PPO | Attending: *Deleted | Admitting: Physical Therapy

## 2023-02-10 ENCOUNTER — Encounter: Payer: BC Managed Care – PPO | Admitting: Speech Pathology

## 2023-02-10 DIAGNOSIS — M6281 Muscle weakness (generalized): Secondary | ICD-10-CM

## 2023-02-10 DIAGNOSIS — H539 Unspecified visual disturbance: Secondary | ICD-10-CM | POA: Diagnosis present

## 2023-02-10 DIAGNOSIS — I69398 Other sequelae of cerebral infarction: Secondary | ICD-10-CM | POA: Diagnosis present

## 2023-02-10 DIAGNOSIS — R278 Other lack of coordination: Secondary | ICD-10-CM | POA: Diagnosis present

## 2023-02-10 DIAGNOSIS — R2681 Unsteadiness on feet: Secondary | ICD-10-CM | POA: Insufficient documentation

## 2023-02-10 DIAGNOSIS — R262 Difficulty in walking, not elsewhere classified: Secondary | ICD-10-CM | POA: Insufficient documentation

## 2023-02-10 NOTE — Therapy (Signed)
OUTPATIENT OCCUPATIONAL THERAPY TREATMENT NOTE    Patient Name: Mark Durham MRN: [id number] DOB:09/24/1950, 72 y.o., male Today's Date: 02/10/2023  PCP: [facility] REFERRING PROVIDER: Hope Pigeon, MD  END OF SESSION:  OT End of Session - 02/10/23 1357     Visit Number 13    Number of Visits 24    Date for OT Re-Evaluation 04/11/23    Progress Note Due on Visit 10    OT Start Time 1400    OT Stop Time 1445    OT Time Calculation (min) 45 min    Equipment Utilized During Treatment transport chair    Activity Tolerance Patient tolerated treatment well    Behavior During Therapy WFL for tasks assessed/performed               Past Medical History:  Diagnosis Date   CVA (cerebral vascular accident) (HCC)    Hypertension    Past Surgical History:  Procedure Laterality Date   TOTAL HIP ARTHROPLASTY Right    Patient Active Problem List   Diagnosis Date Noted   Sepsis due to pneumonia (HCC) 01/09/2023   Severe sepsis (HCC) 01/09/2023   UTI (urinary tract infection) 01/09/2023   Stroke (HCC) 01/09/2023   HTN (hypertension) 01/09/2023   HLD (hyperlipidemia) 01/09/2023   Hypokalemia 01/09/2023   Tobacco use 01/09/2023   Obesity (BMI 30-39.9) 01/09/2023   CAP (community acquired pneumonia) 01/09/2023   Hypomagnesemia 01/09/2023   Hypophosphatemia 01/09/2023   TIA (transient ischemic attack) 01/05/2023   GERD (gastroesophageal reflux disease) 02/26/2021   S/P hip replacement, right 03/04/2020   Primary osteoarthritis of right hip 01/24/2020   Chronic pain of right knee 11/23/2019   Chronic right hip pain 11/23/2019   Osteoarthritis of knee 11/13/2018   History of adenomatous polyp of colon 02/14/2018   History of Barrett's esophagus 02/14/2018   ONSET DATE:  07/25/2022  REFERRING DIAG:  CVA  THERAPY DIAG:  Muscle weakness (generalized)  Other lack of coordination  Visual disturbance following cerebrovascular accident (CVA)  Rationale for  Evaluation and Treatment: Rehabilitation  SUBJECTIVE:  SUBJECTIVE STATEMENT: Pt reports not having his glasses with him this session  Accompanied by: self  PERTINENT HISTORY:   02/01/2023 Addendum Pt. Had recent ED visit from OT on 01/27/2023 due to concerns for high blood pressure and headache. Initially was associated with blurry vision although he resolved at ED. CT scan without concerning abnormality and Pt. Was given medications and discharged.    01/17/2023 Addendum:  Pt. had 2 recent ED visits with hospitalizations. On 01/05/23 Pt. was hospitalized due sudden headache, slurred speech, and right sided facial drop. CT scan of the head showed no acute intracranial abnormality however shoes chronic small vessel ischemic disease. CT angio of the head did not show any LVO or other emergent finding. 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 MRI resulted in no acute finding however showed "Asymmetric T2/FLAIR signal intensity involving the right transverse and sigmoid sinuses as well as the right jugular bulb". Pt. was hospitalized again 01/09/2023 due to severe sepsis and was discharged 01/12/2023.  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 Per chart review, Pt. had a secondary stroke during the hospital stay as well as three seizures. Patient was admitted to [facility] Acute inpatient rehabilitation from 08/10/22-08/31/22 where he was discharged home. Patient was discharged from Home health on  10/28/22 (PT, OT, and ST). PMH includes: GERD, OA, R knee/hip pain, Barrett's esophagus. Pt. Had recent ED visit on 01/05/23 due sudden headache, slurred speech, and right sided facial drop. CT scan of the head showed no acute intracranial abnormality however shoes chronic small vessel ischemic disease. CT angio of the head did not show any LVO or other  emergent finding. 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 MRI resulted in no acute finding however showed "Asymmetric T2/FLAIR signal intensity involving the right transverse and sigmoid sinuses as well as the right jugular bulb". Pt. Was hospitalized 01/09/2023 due to severe sepsis and was discharged 01/12/2023.  PRECAUTIONS: None  WEIGHT BEARING RESTRICTIONS: No  PAIN:  Are you having pain? No  FALLS: Has patient fallen in last 6 months? No  LIVING ENVIRONMENT: Lives with: lives with their spouse Lives in: House/apartment Stairs: 5 steps to enter; one level home Has following equipment at home: Environmental consultant - 2 wheeled, Wheelchair (manual), shower chair, and bed side commode  PLOF: Independent  PATIENT GOALS: To get to walking  OBJECTIVE:   HAND DOMINANCE: Right  ADLs: Transfers/ambulation related to ADLs: Eating: Independent with utensils, assist with cutting food.  Grooming: Independent oral care, wife assists with shaving. UB Dressing: Independent LB Dressing: independent Toileting: Independent Bathing: Once in the shower Supervision with bathing Tub Shower transfers: CGA with showere chair  IADLs: Shopping: Wife performs Light housekeeping: Wife performs most IADLs Meal Prep:  Meal preparation Community mobility: Relies on family and friends for driving. Medication management: Wife sets up, and provides medication Financial management: Wife completes Handwriting:  Pt. Wife reports Difficulty with identifying letters, and numbers   MOBILITY STATUS: Uses a rolling walker  POSTURE COMMENTS:  No Significant postural limitations  ACTIVITY TOLERANCE: Activity tolerance:  Fair  FUNCTIONAL OUTCOME MEASURES: FOTO: 53  TR score: 55 01/17/23: FOTO: 61 TR score: 55  UPPER EXTREMITY ROM:    Active ROM Right Eval [facility] Left Eval [facility]  Right 01/17/2023 [facility] R 01/17/2023 [facility]  Shoulder flexion      Shoulder abduction      Shoulder adduction      Shoulder extension      Shoulder internal rotation      Shoulder external rotation      Elbow flexion      Elbow extension      Wrist flexion      Wrist extension      Wrist ulnar deviation      Wrist radial deviation      Wrist pronation      Wrist supination      (Blank rows = not tested)  UPPER EXTREMITY MMT:     MMT Right Eval 4/5 Left Eval 4+/5 Right 01/17/23 Left 01/17/23  Shoulder flexion 4-/5 4+/5 4+/5 4+/5  Shoulder abduction  l 4/5 4+/5  Shoulder adduction      Shoulder extension      Shoulder internal rotation      Shoulder external rotation      Middle trapezius      Lower trapezius      Elbow flexion 4+/5 5/5 4+/5 5/5  Elbow extension 4+/5 5/5 4+/5 5/5  Wrist flexion      Wrist extension 4/5 4+/5 4+/5 4+/5  Wrist ulnar deviation      Wrist radial deviation      Wrist pronation      Wrist supination      (  Blank rows = not tested)  HAND FUNCTION: Grip strength: Right: 35 lbs; Left: 25 lbs, Lateral pinch: Right: 13 lbs, Left: 11 lbs, and 3 point pinch: Right: 11 lbs, Left: Pt. Unable to assume 3pt. Pinch position 01/17/2023: Grip strength: Right: 45 lbs; Left: 42 lbs, Lateral pinch: Right: 23 lbs, Left: 22 lbs, and 3 point pinch: Right: 18 lbs, Left: 18  COORDINATION: 9 Hole Peg test: Right: placement/removal of 5 pegs in 1 min. & 5 sec; Left: 50 sec Pt. Has difficulty seeing items in the dish on the right side. Pt. Kept moving the vertical pegs between holes with the right 01/17/23: 9 Hole Peg test: Right:1 minute 6 seconds Left: 41 sec Pt. Has difficulty seeing items in the dish on the right side.  SENSATION: Light touch: WFL Proprioception: Impaired   EDEMA: Intact   MUSCLE TONE: RUE: Mild flexor tone. Wife reports right hand presents with flexion in the right hand  COGNITION: Overall cognitive status:  Pt.'s wife reports noticing the Pt. has  difficulty with numbers, and letters.  VISION:    Wife reports the Pt. frequently misses items on the right, and bumping into obstacle on the right   VISION ASSESSMENT: To be further assessed in functional context  11/24/22: impaired smooth pursuits all quadrants, requiring additional head turns; impaired saccades (slow pace, requiring head turns).  Visual fields appear grossly intact, but difficult to assess d/t decreased attention and ability to consistently follow a 2 step command. Moderate-severe R sided inattention noted.  Pt. Has a neuro optometrist appointment scheduled for June.   PERCEPTION:  TBD  PRAXIS: Impaired: Motor planning  TODAY'S TREATMENT:                                                                                                                              DATE: 02/10/2023   Therapeutic Exercise:  Tolerated seated BUE strengthening using the 3# dowel for shoulder flexion, chest press, horizontal "V" patterns, circular motions x 1 set 10 reps. Required intermittent min guard to support to stabilize dowel during horizontal "V" patterns.   Therapeutic Activity:  Pt performed [facility] tasks using the Grooved pegboard. Pt worked on grasping the grooved pegs from a horizontal position and worked on Comptroller, moving the pegs to a vertical position in the hand to prepare for placing them in the grooved slot. Removed all pegs and stored in palm prior to replacing in cup. 2nd trial completed for time, placed 18 pegs in 2'45"  Completed visual scanning activities, locating 88 in list of numbers with 100% accuracy; difficulty maintaining precise lines to underline only 2 numbers. Completed figure ground test with increased difficulty locating all 1s. Initially located 5/13, improved to 10/13 with MIN cues.   Self Care:  Utilized standard pen to write name with no cues for spelling. On initial trial first name written 95% in the correct line spacing. Last name written with  10% above the line. After  rest break, pt writes first and last name, no cues, with 100% legibility and normal line spacing.    PATIENT EDUCATION: Education details: Careers information officer Person educated: Patient, spouse Education method: explanation, vc, demo Education comprehension: pt/spouse verbalized understanding; further training needed  HOME EXERCISE PROGRAM: [place] theraputty; recommended solitaire for increasing R sided visual attention, handwriting practice  GOALS: Goals reviewed with patient?  Yes   SHORT TERM GOALS: Target date: 02/28/23  Pt. Will require Supervision for bilateral UE HEPs.  Baseline: Eval: No current HEP; 01/19/23: pt completes theraputty exercises for R hand strength/coordination; pt/spouse have been provided with activities to increase attention to R visual field, and handwriting activities.  Goal status: Ongoing  LONG TERM GOALS: Target date: 04/11/2023    Pt. Will increase FOTO score by 2 points for Pt. perceived improvement with assessment specific ADLs, and IADLs  Baseline: Eval:  FOTO score: 53 TR score: 55 01/17/2023: FOTO score: 61 TR: 55 Goal status: Met/Achieved  2.  Pt. Will increase bilateral UE strength by 2 mm grades overall to assist with ADLs, and IADLs Baseline: Eval: Right:Shoulder flexion 4/5, abduction 4-/5, elbow flexion 4+/5, extension 4+/5, wrist extension 4/5 Left Shoulder flexion 4+/5, abduction 4+/5, elbow flexion 5/5, extension 5/5, wrist extension: 4+/5 01/17/2023: Right:Shoulder flexion 4+/5, abduction 4/5, elbow flexion 4+/5, extension 4+/5, wrist extension 4+/5 Left Shoulder flexion 4+/5, abduction 4+/5, elbow flexion 5/5, extension 5/5, wrist extension: 4+/5 Goal status: Ongoing  3.  Pt. Will improve bilateral hand [facility] skills by 10 sec. In order to be able to manipulate objects during ADLs, and IADLs Baseline: Eval: Right: Pt. Placed/removed 5 pegs in 1 min, & 5 sec. Pt. Kept moving the vertical pegs between holes with the  right.  Left: 50 sec; 01/17/2023: 9 Hole Peg test: Right:1 minute 6 seconds Left: 41 sec  Goal status: Ongoing  4.  Pt will consistently implement visual compensatory strategies for ADLs, and IADLs Baseline: Eval: Pt. Currently does not utilize. Education to be provided. 01/17/2023: Education provided, continue to implement Goal status: Ongoing  5. Pt. Will improve bilateral grip strength to be able to securely hold items for ADLs, and IADLs. Baseline Eval: Right: 35 lbs; Left: 25 lbs; 01/17/2023: Right: 45 lbs; Left: 42 lbs, Goal status: Ongoing  6. Pt. Will improve bilateral lateral pinch strength to be able to securely hold items for ADLs, and IADLs. Baseline: Right: 13# , Left: 11#; 01/17/23: Right: 23# , Left: 22# Goal status: Ongoing  7. Pt will print first and last name with min vc or less to be able to sign a greeting card. Baseline: 01/19/23: Able to print first name with extra time, and was able to print last name only when copying it, otherwise pt could only construct 4 of the 7 letters in his last name, and letters were not in the correct sequence. Goal status: New  ASSESSMENT:  CLINICAL IMPRESSION: Required demonstration of exercises to ensure consistent speed and proper form; good tolerance of 3# dowel. Wrote his name with correct spelling and letter formation, improved ability to write above the line without cues. Of note patient did not have his glasses this session. Pt. continues to benefit from OT services to continue to increase attention to R visual field, improve basic handwriting skills, and review visual, and cognitive compensatory strategies in order to improve, and maximize independence with ADLs, and IADL tasks.   PERFORMANCE DEFICITS: in functional skills including ADLs, IADLs, coordination, dexterity, ROM, strength, Fine motor control, Gross motor control, decreased knowledge  of use of DME, and UE functional use, cognitive skills including, and psychosocial skills  including.   IMPAIRMENTS: are limiting patient from ADLs, IADLs, play, and leisure.   CO-MORBIDITIES: may have co-morbidities  that affects occupational performance. Patient will benefit from skilled OT to address above impairments and improve overall function.  MODIFICATION OR ASSISTANCE TO COMPLETE EVALUATION: Min-Moderate modification of tasks or assist with assess necessary to complete an evaluation.  OT OCCUPATIONAL PROFILE AND HISTORY: Detailed assessment: Review of records and additional review of physical, cognitive, psychosocial history related to current functional performance.  CLINICAL DECISION MAKING: Moderate - several treatment options, min-mod task modification necessary  REHAB POTENTIAL: Good  EVALUATION COMPLEXITY: Moderate  PLAN:  OT FREQUENCY: 2x/week  OT DURATION: 12 weeks  PLANNED INTERVENTIONS: self care/ADL training, therapeutic exercise, therapeutic activity, neuromuscular re-education, manual therapy, and functional mobility training  RECOMMENDED OTHER SERVICES:  OT and ST  CONSULTED AND AGREED WITH PLAN OF CARE: Patient  PLAN FOR NEXT SESSION: Initiate Treatment  Kathie Dike, M.S. OTR/L  02/10/23, 1:58 PM  ascom [phone]

## 2023-02-10 NOTE — Therapy (Signed)
OUTPATIENT PHYSICAL THERAPY NEURO TREATMENT     Patient Name: Mark Durham MRN: [id number] DOB:Jan 26, 1951, 72 y.o., male Today's Date: 02/10/2023   PCP: [facility] REFERRING PROVIDER: Hope Pigeon MD   END OF SESSION:  PT End of Session - 02/10/23 1459     Visit Number 23    Number of Visits 41    Date for PT Re-Evaluation 04/11/23    Authorization Type [employer] COMM PRO    Authorization Time Period 11/03/22-01/26/23    PT Start Time 1445    PT Stop Time 1530    PT Time Calculation (min) 45 min    Equipment Utilized During Treatment Gait belt    Activity Tolerance Patient tolerated treatment well    Behavior During Therapy WFL for tasks assessed/performed              Past Medical History:  Diagnosis Date   CVA (cerebral vascular accident) (HCC)    Hypertension    Past Surgical History:  Procedure Laterality Date   TOTAL HIP ARTHROPLASTY Right    Patient Active Problem List   Diagnosis Date Noted   Sepsis due to pneumonia (HCC) 01/09/2023   Severe sepsis (HCC) 01/09/2023   UTI (urinary tract infection) 01/09/2023   Stroke (HCC) 01/09/2023   HTN (hypertension) 01/09/2023   HLD (hyperlipidemia) 01/09/2023   Hypokalemia 01/09/2023   Tobacco use 01/09/2023   Obesity (BMI 30-39.9) 01/09/2023   CAP (community acquired pneumonia) 01/09/2023   Hypomagnesemia 01/09/2023   Hypophosphatemia 01/09/2023   TIA (transient ischemic attack) 01/05/2023   GERD (gastroesophageal reflux disease) 02/26/2021   S/P hip replacement, right 03/04/2020   Primary osteoarthritis of right hip 01/24/2020   Chronic pain of right knee 11/23/2019   Chronic right hip pain 11/23/2019   Osteoarthritis of knee 11/13/2018   History of adenomatous polyp of colon 02/14/2018   History of Barrett's esophagus 02/14/2018    ONSET DATE: January 2024  REFERRING DIAG: weakness due to stroke   THERAPY DIAG:  Muscle weakness (generalized)  Other lack of  coordination  Difficulty in walking, not elsewhere classified  Unsteadiness on feet  Visual disturbance following cerebrovascular accident (CVA)  Rationale for Evaluation and Treatment: Rehabilitation  SUBJECTIVE:                                                                                                                                                                                             SUBJECTIVE STATEMENT:  Pt reports doing well today. No new updates since prior session.   Pt accompanied by: significant other  PERTINENT HISTORY: Patient presents to physical therapy for weakness  s/p stroke . 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 Per documentation he had a secondary stroke during the hospital stay as well as three seizures. Patient was admitted to [facility] Acute inpatient rehabilitation from 08/10/22-08/31/22 where he was discharged home. Patient was discharged from Home health on 10/28/22 (PT, OT, and ST). PMH includes GERD, OA, R knee/hip pain, Barrett's esophagus. Is using a walker, still drags right foot. Needs assistance for transfers but can bath and dress self.   PAIN:  Are you having pain? No  PRECAUTIONS: Fall  WEIGHT BEARING RESTRICTIONS: No  FALLS: Has patient fallen in last 6 months? No    PATIENT GOALS: walking with less AD, get back to mowing the yard, gardening, grilling     OBJECTIVE:   TODAY'S TREATMENT: DATE: 02/10/23  NMR:   Nustep x 6 min  1 min level 1 4 min level 4 1 min level 2 cool down.  Instruction from PT throughout for full ROM on the RLE.  RPE not ranked, but states, "I was working"  Sit<>stand with UE support on rails x 10  Sit<>stand with UE pushing from thighs. X 10  Standing NMR :  Hip abduction x 10 bil  Hip flexion x 10 bil  HS curl x x 10 bil   PT instructed pt visual scanning and balance tasks:  RUE sorting magents task by color from R to L. Performed x 2 min on level  surface, x2 min on airex pad, x 2 min on airex pad with narrow BOS.  No LOB noted, but min cues for visual scanning and object of task initially on each bout.   Gait without AD x 92ft with no AD and min cues for decreased cadence and improved attention to the RLE to maintain heel contact and adequate step height. CGA for safety.     Throughout session PT provided contact-guard assist to min assist for improved safety and improve weight shift allowing increased step length and width on the right lower extremity when fatigued.  Patient required constant cues for increased step with with any lateral movement.  PATIENT EDUCATION: Education details: goals, POC, HEP Pt educated throughout session about proper posture and technique with exercises. Improved exercise technique, movement at target joints, use of target muscles after min to mod verbal, visual, tactile cues.  Person educated: Patient and Spouse Education method: Explanation, Demonstration, Tactile cues, and Verbal cues Education comprehension: verbalized understanding, returned demonstration, verbal cues required, and tactile cues required  HOME EXERCISE PROGRAM:  Access Code: [email or web address] URL: [email or web address] Date: 12/23/2022 Prepared by: Grier Rocher  Exercises - Standing Tandem Balance with Counter Support  - 1 x daily - 5 x weekly - 3 sets - 4 reps - 10 hold - Standing Single Leg Stance with Counter Support  - 1 x daily - 5 x weekly - 3 sets - 4 reps - 10 hold  Access Code: [email or web address] URL: [email or web address] Date: 11/03/2022 Prepared by: Precious Bard  Exercises - Seated March  - 1 x daily - 7 x weekly - 2 sets - 10 reps - 5 hold - Seated Long Arc Quad  - 1 x daily - 7 x weekly - 2 sets - 10 reps - 5 hold - Seated Heel Toe Raises  - 1 x daily - 7 x weekly - 2 sets - 10 reps - 5 hold - Seated Hip Abduction  - 1 x daily - 7 x weekly - 2 sets - 10 reps -  5 hold - Seated Isometric Hip Adduction  with Ball  - 1 x daily - 7 x weekly - 2 sets - 10 reps - 5 hold  GOALS: Goals reviewed with patient? Yes  SHORT TERM GOALS: Target date: 01/17/2023  Patient will be independent in home exercise program to improve strength/mobility for better functional independence with ADLs. Baseline:4/24: HEP given  Goal status: IN PROGRESS    LONG TERM GOALS: Target date: 01/26/2023  Patient will increase FOTO score to equal to or greater than  56 to demonstrate statistically significant improvement in mobility and quality of life.   Baseline: 40 6/3: 41 7/8:54 7/23: 52 Goal status: REVISED  2.  Patient (> 41 years old) will complete five times sit to stand test in < 15 seconds indicating an increased LE strength and improved balance. Baseline: 4/24: 38.37 with BUE support 6/3: 12.02 sec with UE support 7/8: 18.12 sec without UE assist 7/23: 16.93 without UE assist  Goal status: IN PROGRESS  3.  Patient will increase 10 meter walk test to >1.27m/s as to improve gait speed for better community ambulation and to reduce fall risk. Baseline: 4/24: 19.2 seconds with RW; dragging of R foot  6/3: 17.175 with no AD and CGA from PT.  7/8: 14.6 sec without AD (0.13m/s) 7/23: 13.21sec without AD (0.73m/s)   Goal status: IN PROGRESS  4.  Patient will increase Berg Balance score by > 45points to demonstrate decreased fall risk during functional activities. Baseline: 4/24: see next session 5/2: 30/56 6/3: 31/56 7/8 42/56  7/23:41/56  Goal status: REVISED In progress    5.  Patient will increase 2 min walk test  >300 ft as to demonstrate reduced fall risk and improved dynamic gait balance for better safety with community/home ambulation.   Baseline: 201 7/23: 245 with min assist due to heavy R foot drag for last 75ft  Goal status: REVISED in progress,      ASSESSMENT:  CLINICAL IMPRESSION:   Patient appeared ready for treatment on this day. PT treatment focused on dynamic balance with  emphasis on visual scanning to the R. Pt noted to have ability to see to the R, but required min cues for attention to task on the R side initially. Was able to perform adequate step length for short distances, but required moderate cues from PT for improved attention to the R foot to prevent drag after 48ft.  Pt will continue to benefit from skilled therapy to address remaining deficits in order to improve overall QoL and return to PLOF.      OBJECTIVE IMPAIRMENTS: Abnormal gait, cardiopulmonary status limiting activity, decreased activity tolerance, decreased balance, decreased cognition, decreased coordination, decreased endurance, decreased knowledge of condition, decreased knowledge of use of DME, decreased mobility, difficulty walking, decreased strength, decreased safety awareness, impaired perceived functional ability, impaired flexibility, impaired UE functional use, impaired vision/preception, improper body mechanics, and postural dysfunction.   ACTIVITY LIMITATIONS: carrying, lifting, bending, sitting, standing, squatting, sleeping, stairs, transfers, bed mobility, bathing, toileting, reach over head, hygiene/grooming, locomotion level, and caring for others  PARTICIPATION LIMITATIONS: meal prep, cleaning, laundry, medication management, personal finances, interpersonal relationship, driving, shopping, community activity, and yard work  PERSONAL FACTORS: Age, Past/current experiences, Time since onset of injury/illness/exacerbation, Transportation, and 3+ comorbidities: GERD, OA, R knee/hip pain, Barrett's esophagus  are also affecting patient's functional outcome.   REHAB POTENTIAL: Good  CLINICAL DECISION MAKING: Evolving/moderate complexity  EVALUATION COMPLEXITY: Moderate  PLAN:  PT FREQUENCY: 2x/week  PT DURATION: 12 weeks  PLANNED INTERVENTIONS: Therapeutic exercises, Therapeutic activity, Neuromuscular re-education, Balance training, Gait training, Patient/Family education,  Self Care, Joint mobilization, Joint manipulation, Stair training, Vestibular training, Canalith repositioning, Visual/preceptual remediation/compensation, Orthotic/Fit training, Electrical stimulation, Wheelchair mobility training, Spinal mobilization, Cryotherapy, Moist heat, Splintting, Taping, Ultrasound, Ionotophoresis 4mg /ml Dexamethasone, Manual therapy, and Re-evaluation  PLAN FOR NEXT SESSION:    Dynamic balance/gait training Visual scanning to the right Endurance training  Grier Rocher PT, DPT  Physical Therapist - [facility]  3:05 PM 02/10/23

## 2023-02-15 ENCOUNTER — Encounter: Payer: BC Managed Care – PPO | Admitting: Speech Pathology

## 2023-02-15 ENCOUNTER — Ambulatory Visit: Payer: BC Managed Care – PPO | Admitting: Occupational Therapy

## 2023-02-15 ENCOUNTER — Encounter: Payer: Self-pay | Admitting: Occupational Therapy

## 2023-02-15 ENCOUNTER — Ambulatory Visit: Payer: BC Managed Care – PPO | Admitting: Physical Therapy

## 2023-02-15 DIAGNOSIS — R278 Other lack of coordination: Secondary | ICD-10-CM

## 2023-02-15 DIAGNOSIS — M6281 Muscle weakness (generalized): Secondary | ICD-10-CM

## 2023-02-15 DIAGNOSIS — R262 Difficulty in walking, not elsewhere classified: Secondary | ICD-10-CM

## 2023-02-15 DIAGNOSIS — H539 Unspecified visual disturbance: Secondary | ICD-10-CM

## 2023-02-15 DIAGNOSIS — R2681 Unsteadiness on feet: Secondary | ICD-10-CM

## 2023-02-15 NOTE — Therapy (Signed)
OUTPATIENT PHYSICAL THERAPY NEURO TREATMENT     Patient Name: Mark Durham MRN: [id number] DOB:03/30/1951, 72 y.o., male Today's Date: 02/15/2023   PCP: [facility] REFERRING PROVIDER: Hope Pigeon MD   END OF SESSION:  PT End of Session - 02/15/23 1448     Visit Number 24    Number of Visits 41    Date for PT Re-Evaluation 04/11/23    Authorization Type [employer] COMM PRO    Authorization Time Period 11/03/22-01/26/23    PT Start Time 1449    PT Stop Time 1530    PT Time Calculation (min) 41 min    Equipment Utilized During Treatment Gait belt    Activity Tolerance Patient tolerated treatment well    Behavior During Therapy WFL for tasks assessed/performed              Past Medical History:  Diagnosis Date   CVA (cerebral vascular accident) (HCC)    Hypertension    Past Surgical History:  Procedure Laterality Date   TOTAL HIP ARTHROPLASTY Right    Patient Active Problem List   Diagnosis Date Noted   Sepsis due to pneumonia (HCC) 01/09/2023   Severe sepsis (HCC) 01/09/2023   UTI (urinary tract infection) 01/09/2023   Stroke (HCC) 01/09/2023   HTN (hypertension) 01/09/2023   HLD (hyperlipidemia) 01/09/2023   Hypokalemia 01/09/2023   Tobacco use 01/09/2023   Obesity (BMI 30-39.9) 01/09/2023   CAP (community acquired pneumonia) 01/09/2023   Hypomagnesemia 01/09/2023   Hypophosphatemia 01/09/2023   TIA (transient ischemic attack) 01/05/2023   GERD (gastroesophageal reflux disease) 02/26/2021   S/P hip replacement, right 03/04/2020   Primary osteoarthritis of right hip 01/24/2020   Chronic pain of right knee 11/23/2019   Chronic right hip pain 11/23/2019   Osteoarthritis of knee 11/13/2018   History of adenomatous polyp of colon 02/14/2018   History of Barrett's esophagus 02/14/2018    ONSET DATE: January 2024  REFERRING DIAG: weakness due to stroke   THERAPY DIAG:  Other lack of coordination  Muscle weakness (generalized)  Visual  disturbance following cerebrovascular accident (CVA)  Difficulty in walking, not elsewhere classified  Unsteadiness on feet  Rationale for Evaluation and Treatment: Rehabilitation  SUBJECTIVE:                                                                                                                                                                                             SUBJECTIVE STATEMENT:  Pt reports doing well today. No new updates since prior session.   Pt accompanied by: significant other  PERTINENT HISTORY: Patient presents to physical therapy for weakness  s/p stroke . 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 Per documentation he had a secondary stroke during the hospital stay as well as three seizures. Patient was admitted to [facility] Acute inpatient rehabilitation from 08/10/22-08/31/22 where he was discharged home. Patient was discharged from Home health on 10/28/22 (PT, OT, and ST). PMH includes GERD, OA, R knee/hip pain, Barrett's esophagus. Is using a walker, still drags right foot. Needs assistance for transfers but can bath and dress self.   PAIN:  Are you having pain? No  PRECAUTIONS: Fall  WEIGHT BEARING RESTRICTIONS: No  FALLS: Has patient fallen in last 6 months? No    PATIENT GOALS: walking with less AD, get back to mowing the yard, gardening, grilling     OBJECTIVE:   TODAY'S TREATMENT: DATE: 02/15/23  Sit<>stand 3x5 intermittnet UE support to push from arm rests.   At rail on wall.  Squat 2x 10  Hip abduction 2x 10  HS curl 2x 10  Hip flexion x 10 bil   Gait with SPC for 121ft, requiring 4 min to complete. Performed for first 16ft with SPC in the RUE per pt preference, and then in the LUE for last3ft. Pt noted to have no significant difference in step length/height on the RLE with ecah set up.   Gait with RW x 120ft with RW, 2:29 to complete noted improvement in step length on the RLE with RW compared to  with no AD or SPC. Additional gait x 172ft with RW, able to complete in 1:58min with moderate instruction to increased speed and step length on the R after 45ft.   NThroughout session PT provided contact-guard assist to min assist for improved safety and improve weight shift allowing increased step length and width on the right lower extremity when fatigued.  Patient required constant cues for increased step with with any lateral movement.  PATIENT EDUCATION: Education details: goals, POC, HEP Pt educated throughout session about proper posture and technique with exercises. Improved exercise technique, movement at target joints, use of target muscles after min to mod verbal, visual, tactile cues.  Person educated: Patient and Spouse Education method: Explanation, Demonstration, Tactile cues, and Verbal cues Education comprehension: verbalized understanding, returned demonstration, verbal cues required, and tactile cues required  HOME EXERCISE PROGRAM:  Access Code: [email or web address] URL: [email or web address] Date: 12/23/2022 Prepared by: Grier Rocher  Exercises - Standing Tandem Balance with Counter Support  - 1 x daily - 5 x weekly - 3 sets - 4 reps - 10 hold - Standing Single Leg Stance with Counter Support  - 1 x daily - 5 x weekly - 3 sets - 4 reps - 10 hold  Access Code: [email or web address] URL: [email or web address] Date: 11/03/2022 Prepared by: Precious Bard  Exercises - Seated March  - 1 x daily - 7 x weekly - 2 sets - 10 reps - 5 hold - Seated Long Arc Quad  - 1 x daily - 7 x weekly - 2 sets - 10 reps - 5 hold - Seated Heel Toe Raises  - 1 x daily - 7 x weekly - 2 sets - 10 reps - 5 hold - Seated Hip Abduction  - 1 x daily - 7 x weekly - 2 sets - 10 reps - 5 hold - Seated Isometric Hip Adduction with Ball  - 1 x daily - 7 x weekly - 2 sets - 10 reps - 5 hold  GOALS: Goals reviewed with patient? Yes  SHORT TERM GOALS: Target  date: 01/17/2023  Patient will be  independent in home exercise program to improve strength/mobility for better functional independence with ADLs. Baseline:4/24: HEP given  Goal status: IN PROGRESS    LONG TERM GOALS: Target date: 01/26/2023  Patient will increase FOTO score to equal to or greater than  56 to demonstrate statistically significant improvement in mobility and quality of life.   Baseline: 40 6/3: 41 7/8:54 7/23: 52 Goal status: REVISED  2.  Patient (> 89 years old) will complete five times sit to stand test in < 15 seconds indicating an increased LE strength and improved balance. Baseline: 4/24: 38.37 with BUE support 6/3: 12.02 sec with UE support 7/8: 18.12 sec without UE assist 7/23: 16.93 without UE assist  Goal status: IN PROGRESS  3.  Patient will increase 10 meter walk test to >1.46m/s as to improve gait speed for better community ambulation and to reduce fall risk. Baseline: 4/24: 19.2 seconds with RW; dragging of R foot  6/3: 17.175 with no AD and CGA from PT.  7/8: 14.6 sec without AD (0.27m/s) 7/23: 13.21sec without AD (0.38m/s)   Goal status: IN PROGRESS  4.  Patient will increase Berg Balance score by > 45points to demonstrate decreased fall risk during functional activities. Baseline: 4/24: see next session 5/2: 30/56 6/3: 31/56 7/8 42/56  7/23:41/56  Goal status: REVISED In progress    5.  Patient will increase 2 min walk test  >300 ft as to demonstrate reduced fall risk and improved dynamic gait balance for better safety with community/home ambulation.   Baseline: 201 7/23: 245 with min assist due to heavy R foot drag for last 65ft  Goal status: REVISED in progress,      ASSESSMENT:  CLINICAL IMPRESSION:   Patient appeared ready for treatment on this day. PT treatment focused on BLE strengthening and functional gait for prolonged distance for endurance training. Pt demonstrates improved safety and gait speed with RW compared to no AD, but continues to deemonsrtate  lateral lean to the R with fatigue preventing adequate step length without assist or cues from PT Pt will continue to benefit from skilled therapy to address remaining deficits in order to improve overall QoL and return to PLOF.      OBJECTIVE IMPAIRMENTS: Abnormal gait, cardiopulmonary status limiting activity, decreased activity tolerance, decreased balance, decreased cognition, decreased coordination, decreased endurance, decreased knowledge of condition, decreased knowledge of use of DME, decreased mobility, difficulty walking, decreased strength, decreased safety awareness, impaired perceived functional ability, impaired flexibility, impaired UE functional use, impaired vision/preception, improper body mechanics, and postural dysfunction.   ACTIVITY LIMITATIONS: carrying, lifting, bending, sitting, standing, squatting, sleeping, stairs, transfers, bed mobility, bathing, toileting, reach over head, hygiene/grooming, locomotion level, and caring for others  PARTICIPATION LIMITATIONS: meal prep, cleaning, laundry, medication management, personal finances, interpersonal relationship, driving, shopping, community activity, and yard work  PERSONAL FACTORS: Age, Past/current experiences, Time since onset of injury/illness/exacerbation, Transportation, and 3+ comorbidities: GERD, OA, R knee/hip pain, Barrett's esophagus  are also affecting patient's functional outcome.   REHAB POTENTIAL: Good  CLINICAL DECISION MAKING: Evolving/moderate complexity  EVALUATION COMPLEXITY: Moderate  PLAN:  PT FREQUENCY: 2x/week  PT DURATION: 12 weeks  PLANNED INTERVENTIONS: Therapeutic exercises, Therapeutic activity, Neuromuscular re-education, Balance training, Gait training, Patient/Family education, Self Care, Joint mobilization, Joint manipulation, Stair training, Vestibular training, Canalith repositioning, Visual/preceptual remediation/compensation, Orthotic/Fit training, Electrical stimulation, Wheelchair  mobility training, Spinal mobilization, Cryotherapy, Moist heat, Splintting, Taping, Ultrasound, Ionotophoresis 4mg /ml Dexamethasone, Manual therapy, and Re-evaluation  PLAN FOR NEXT  SESSION:   Acupuncturist Visual scanning to the right    Grier Rocher PT, DPT  Physical Therapist - [facility]  2:48 PM 02/15/23

## 2023-02-15 NOTE — Therapy (Signed)
OUTPATIENT OCCUPATIONAL THERAPY TREATMENT NOTE    Patient Name: Mark Durham MRN: [id number] DOB:[birth year], 72 y.o., male Today's Date: 02/15/2023  PCP: [facility] REFERRING PROVIDER: Hope Pigeon, MD  END OF SESSION:  OT End of Session - 02/15/23 1526     Visit Number 14    Number of Visits 24    Date for OT Re-Evaluation 04/11/23    Progress Note Due on Visit 10    OT Start Time 1530    OT Stop Time 1615    OT Time Calculation (min) 45 min    Equipment Utilized During Treatment transport chair    Activity Tolerance Patient tolerated treatment well    Behavior During Therapy WFL for tasks assessed/performed               Past Medical History:  Diagnosis Date   CVA (cerebral vascular accident) (HCC)    Hypertension    Past Surgical History:  Procedure Laterality Date   TOTAL HIP ARTHROPLASTY Right    Patient Active Problem List   Diagnosis Date Noted   Sepsis due to pneumonia (HCC) 01/09/2023   Severe sepsis (HCC) 01/09/2023   UTI (urinary tract infection) 01/09/2023   Stroke (HCC) 01/09/2023   HTN (hypertension) 01/09/2023   HLD (hyperlipidemia) 01/09/2023   Hypokalemia 01/09/2023   Tobacco use 01/09/2023   Obesity (BMI 30-39.9) 01/09/2023   CAP (community acquired pneumonia) 01/09/2023   Hypomagnesemia 01/09/2023   Hypophosphatemia 01/09/2023   TIA (transient ischemic attack) 01/05/2023   GERD (gastroesophageal reflux disease) 02/26/2021   S/P hip replacement, right 03/04/2020   Primary osteoarthritis of right hip 01/24/2020   Chronic pain of right knee 11/23/2019   Chronic right hip pain 11/23/2019   Osteoarthritis of knee 11/13/2018   History of adenomatous polyp of colon 02/14/2018   History of Barrett's esophagus 02/14/2018   ONSET DATE:  07/25/2022  REFERRING DIAG:  CVA  THERAPY DIAG:  Muscle weakness (generalized)  Other lack of coordination  Rationale for Evaluation and Treatment: Rehabilitation  SUBJECTIVE:   SUBJECTIVE STATEMENT: Pt reports his theraputty is too easy.   Accompanied by: self  PERTINENT HISTORY:   02/01/2023 Addendum Pt. Had recent ED visit from OT on 01/27/2023 due to concerns for high blood pressure and headache. Initially was associated with blurry vision although he resolved at ED. CT scan without concerning abnormality and Pt. Was given medications and discharged.    01/17/2023 Addendum:  Pt. had 2 recent ED visits with hospitalizations. On 01/05/23 Pt. was hospitalized due sudden headache, slurred speech, and right sided facial drop. CT scan of the head showed no acute intracranial abnormality however shoes chronic small vessel ischemic disease. CT angio of the head did not show any LVO or other emergent finding. 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 MRI resulted in no acute finding however showed "Asymmetric T2/FLAIR signal intensity involving the right transverse and sigmoid sinuses as well as the right jugular bulb". Pt. was hospitalized again 01/09/2023 due to severe sepsis and was discharged 01/12/2023.  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 Per chart review, Pt. had a secondary stroke during the hospital stay as well as three seizures. Patient was admitted to [facility] Acute inpatient rehabilitation from 08/10/22-08/31/22 where he was discharged home. Patient was discharged from Home health on 10/28/22 (PT, OT, and ST). PMH includes: GERD, OA,  R knee/hip pain, Barrett's esophagus. Pt. Had recent ED visit on 01/05/23 due sudden headache, slurred speech, and right sided facial drop. CT scan of the head showed no acute intracranial abnormality however shoes chronic small vessel ischemic disease. CT angio of the head did not show any LVO or other emergent finding. However it showed diffuse intracranial atherosclerotic  disease including severe proximal right M2 and distal left V4 stenosis, severe stenosis of the cavernous left ICA. MRI resulted in no acute finding however showed "Asymmetric T2/FLAIR signal intensity involving the right transverse and sigmoid sinuses as well as the right jugular bulb". Pt. Was hospitalized 01/09/2023 due to severe sepsis and was discharged 01/12/2023.  PRECAUTIONS: None  WEIGHT BEARING RESTRICTIONS: No  PAIN:  Are you having pain? No  FALLS: Has patient fallen in last 6 months? No  LIVING ENVIRONMENT: Lives with: lives with their spouse Lives in: House/apartment Stairs: 5 steps to enter; one level home Has following equipment at home: Environmental consultant - 2 wheeled, Wheelchair (manual), shower chair, and bed side commode  PLOF: Independent  PATIENT GOALS: To get to walking  OBJECTIVE:   HAND DOMINANCE: Right  ADLs: Transfers/ambulation related to ADLs: Eating: Independent with utensils, assist with cutting food.  Grooming: Independent oral care, wife assists with shaving. UB Dressing: Independent LB Dressing: independent Toileting: Independent Bathing: Once in the shower Supervision with bathing Tub Shower transfers: CGA with showere chair  IADLs: Shopping: Wife performs Light housekeeping: Wife performs most IADLs Meal Prep:  Meal preparation Community mobility: Relies on family and friends for driving. Medication management: Wife sets up, and provides medication Financial management: Wife completes Handwriting:  Pt. Wife reports Difficulty with identifying letters, and numbers   MOBILITY STATUS: Uses a rolling walker  POSTURE COMMENTS:  No Significant postural limitations  ACTIVITY TOLERANCE: Activity tolerance:  Fair  FUNCTIONAL OUTCOME MEASURES: FOTO: 53  TR score: 55 01/17/23: FOTO: 61 TR score: 55  UPPER EXTREMITY ROM:    Active ROM Right Eval [facility] Left Eval [facility] Right 01/17/2023 [facility] R 01/17/2023 [facility]  Shoulder flexion      Shoulder abduction       Shoulder adduction      Shoulder extension      Shoulder internal rotation      Shoulder external rotation      Elbow flexion      Elbow extension      Wrist flexion      Wrist extension      Wrist ulnar deviation      Wrist radial deviation      Wrist pronation      Wrist supination      (Blank rows = not tested)  UPPER EXTREMITY MMT:     MMT Right Eval 4/5 Left Eval 4+/5 Right 01/17/23 Left 01/17/23  Shoulder flexion 4-/5 4+/5 4+/5 4+/5  Shoulder abduction  l 4/5 4+/5  Shoulder adduction      Shoulder extension      Shoulder internal rotation      Shoulder external rotation      Middle trapezius      Lower trapezius      Elbow flexion 4+/5 5/5 4+/5 5/5  Elbow extension 4+/5 5/5 4+/5 5/5  Wrist flexion      Wrist extension 4/5 4+/5 4+/5 4+/5  Wrist ulnar deviation      Wrist radial deviation      Wrist pronation      Wrist supination      (Blank rows = not tested)  HAND FUNCTION: Grip strength: Right: 35 lbs; Left: 25 lbs, Lateral pinch: Right: 13 lbs, Left: 11 lbs, and 3 point pinch: Right: 11 lbs, Left: Pt. Unable to assume 3pt. Pinch position 01/17/2023: Grip strength: Right: 45 lbs; Left: 42 lbs, Lateral pinch: Right: 23 lbs, Left: 22 lbs, and 3 point pinch: Right: 18 lbs, Left: 18  COORDINATION: 9 Hole Peg test: Right: placement/removal of 5 pegs in 1 min. & 5 sec; Left: 50 sec Pt. Has difficulty seeing items in the dish on the right side. Pt. Kept moving the vertical pegs between holes with the right 01/17/23: 9 Hole Peg test: Right:1 minute 6 seconds Left: 41 sec Pt. Has difficulty seeing items in the dish on the right side.  SENSATION: Light touch: WFL Proprioception: Impaired   EDEMA: Intact   MUSCLE TONE: RUE: Mild flexor tone. Wife reports right hand presents with flexion in the right hand  COGNITION: Overall cognitive status:  Pt.'s wife reports noticing the Pt. has difficulty with numbers, and letters.  VISION:    Wife reports the Pt. frequently  misses items on the right, and bumping into obstacle on the right   VISION ASSESSMENT: To be further assessed in functional context  11/24/22: impaired smooth pursuits all quadrants, requiring additional head turns; impaired saccades (slow pace, requiring head turns).  Visual fields appear grossly intact, but difficult to assess d/t decreased attention and ability to consistently follow a 2 step command. Moderate-severe R sided inattention noted.  Pt. Has a neuro optometrist appointment scheduled for June.   PERCEPTION:  TBD  PRAXIS: Impaired: Motor planning  TODAY'S TREATMENT:                                                                                                                              DATE: 02/15/2023   Therapeutic Exercise:  Pt worked on pinch strengthening in the left hand for lateral and 3pt. pinch using yellow, red, green, blue, and black resistive clips. Pt. worked on placing the clips at various vertical and horizontal angles. Intermittent cues to scan table and locate dowel to place clips.  Pt performed hand strengthening with blue theraputty, reviewed HEP (spouse reports pt completes gross gripping only). Visual and verbal cues for proper technique. Pt. worked on gross grip loop, lateral pinch, 3pt. pinch, gross digit extension, digit extension table spread, digit abduction loop, single digit extension loop, thumb opposition, and lumbical ex. Tolerated seated BUE strengthening using the 3# dowel for shoulder flexion, chest press, horizontal "V" patterns, circular motions x 1 set 10 reps. Required intermittent min guard to support to stabilize dowel during horizontal "V" patterns.   Therapeutic Activity:  Pt worked on grasping washers of various sizes from magnetic bowl and threaded onto vertical dowel - difficulty noted locating dowel at midline and bowl on R side of body. 2nd trial pt grasped 3-4 washers from bowl and manipulated in palm to fingertips to place on dowel.  PATIENT EDUCATION: Education details: Careers information officer Person educated: Patient, spouse Education method: explanation, vc, demo Education comprehension: pt/spouse verbalized understanding; further training needed  HOME EXERCISE PROGRAM: [place]quoise theraputty; recommended solitaire for increasing R sided visual attention, handwriting practice  GOALS: Goals reviewed with patient?  Yes   SHORT TERM GOALS: Target date: 02/28/23  Pt. Will require Supervision for bilateral UE HEPs.  Baseline: Eval: No current HEP; 01/19/23: pt completes theraputty exercises for R hand strength/coordination; pt/spouse have been provided with activities to increase attention to R visual field, and handwriting activities.  Goal status: Ongoing  LONG TERM GOALS: Target date: 04/11/2023    Pt. Will increase FOTO score by 2 points for Pt. perceived improvement with assessment specific ADLs, and IADLs  Baseline: Eval:  FOTO score: 53 TR score: 55 01/17/2023: FOTO score: 61 TR: 55 Goal status: Met/Achieved  2.  Pt. Will increase bilateral UE strength by 2 mm grades overall to assist with ADLs, and IADLs Baseline: Eval: Right:Shoulder flexion 4/5, abduction 4-/5, elbow flexion 4+/5, extension 4+/5, wrist extension 4/5 Left Shoulder flexion 4+/5, abduction 4+/5, elbow flexion 5/5, extension 5/5, wrist extension: 4+/5 01/17/2023: Right:Shoulder flexion 4+/5, abduction 4/5, elbow flexion 4+/5, extension 4+/5, wrist extension 4+/5 Left Shoulder flexion 4+/5, abduction 4+/5, elbow flexion 5/5, extension 5/5, wrist extension: 4+/5 Goal status: Ongoing  3.  Pt. Will improve bilateral hand [facility] skills by 10 sec. In order to be able to manipulate objects during ADLs, and IADLs Baseline: Eval: Right: Pt. Placed/removed 5 pegs in 1 min, & 5 sec. Pt. Kept moving the vertical pegs between holes with the right.  Left: 50 sec; 01/17/2023: 9 Hole Peg test: Right:1 minute 6 seconds Left: 41 sec  Goal status: Ongoing  4.  Pt  will consistently implement visual compensatory strategies for ADLs, and IADLs Baseline: Eval: Pt. Currently does not utilize. Education to be provided. 01/17/2023: Education provided, continue to implement Goal status: Ongoing  5. Pt. Will improve bilateral grip strength to be able to securely hold items for ADLs, and IADLs. Baseline Eval: Right: 35 lbs; Left: 25 lbs; 01/17/2023: Right: 45 lbs; Left: 42 lbs, Goal status: Ongoing  6. Pt. Will improve bilateral lateral pinch strength to be able to securely hold items for ADLs, and IADLs. Baseline: Right: 13# , Left: 11#; 01/17/23: Right: 23# , Left: 22# Goal status: Ongoing  7. Pt will print first and last name with min vc or less to be able to sign a greeting card. Baseline: 01/19/23: Able to print first name with extra time, and was able to print last name only when copying it, otherwise pt could only construct 4 of the 7 letters in his last name, and letters were not in the correct sequence. Goal status: New  ASSESSMENT:  CLINICAL IMPRESSION: Required demonstration of exercises to ensure consistent speed and proper form; good tolerance of 3# dowel. Pt wearing glasses however difficulty locating small wooden dowel to place clips/washers. Good tolerance for clips and theraputty for grip strengthening - issued blue theraputty for HEP. Pt. continues to benefit from OT services to continue to increase attention to R visual field, improve basic handwriting skills, and review visual, and cognitive compensatory strategies in order to improve, and maximize independence with ADLs, and IADL tasks.   PERFORMANCE DEFICITS: in functional skills including ADLs, IADLs, coordination, dexterity, ROM, strength, Fine motor control, Gross motor control, decreased knowledge of use of DME, and UE functional use, cognitive skills including, and psychosocial skills including.   IMPAIRMENTS: are limiting patient  from ADLs, IADLs, play, and leisure.   CO-MORBIDITIES: may  have co-morbidities  that affects occupational performance. Patient will benefit from skilled OT to address above impairments and improve overall function.  MODIFICATION OR ASSISTANCE TO COMPLETE EVALUATION: Min-Moderate modification of tasks or assist with assess necessary to complete an evaluation.  OT OCCUPATIONAL PROFILE AND HISTORY: Detailed assessment: Review of records and additional review of physical, cognitive, psychosocial history related to current functional performance.  CLINICAL DECISION MAKING: Moderate - several treatment options, min-mod task modification necessary  REHAB POTENTIAL: Good  EVALUATION COMPLEXITY: Moderate  PLAN:  OT FREQUENCY: 2x/week  OT DURATION: 12 weeks  PLANNED INTERVENTIONS: self care/ADL training, therapeutic exercise, therapeutic activity, neuromuscular re-education, manual therapy, and functional mobility training  RECOMMENDED OTHER SERVICES:  OT and ST  CONSULTED AND AGREED WITH PLAN OF CARE: Patient  PLAN FOR NEXT SESSION: HEP  Kathie Dike, M.S. OTR/L  02/15/23, 3:27 PM  ascom [phone]

## 2023-02-17 ENCOUNTER — Ambulatory Visit: Payer: BC Managed Care – PPO | Admitting: Physical Therapy

## 2023-02-17 ENCOUNTER — Ambulatory Visit: Payer: BC Managed Care – PPO

## 2023-02-22 ENCOUNTER — Encounter: Payer: BC Managed Care – PPO | Admitting: Speech Pathology

## 2023-02-22 ENCOUNTER — Ambulatory Visit: Payer: BC Managed Care – PPO | Admitting: Occupational Therapy

## 2023-02-22 ENCOUNTER — Ambulatory Visit: Payer: BC Managed Care – PPO | Admitting: Physical Therapy

## 2023-02-22 DIAGNOSIS — M6281 Muscle weakness (generalized): Secondary | ICD-10-CM

## 2023-02-22 DIAGNOSIS — R2681 Unsteadiness on feet: Secondary | ICD-10-CM

## 2023-02-22 DIAGNOSIS — R262 Difficulty in walking, not elsewhere classified: Secondary | ICD-10-CM

## 2023-02-22 DIAGNOSIS — R278 Other lack of coordination: Secondary | ICD-10-CM

## 2023-02-22 DIAGNOSIS — I69398 Other sequelae of cerebral infarction: Secondary | ICD-10-CM

## 2023-02-22 NOTE — Therapy (Unsigned)
OUTPATIENT PHYSICAL THERAPY NEURO TREATMENT     Patient Name: Mark Durham MRN: [id number] DOB:June 13, 1951, 72 y.o., male Today's Date: 02/22/2023   PCP: [facility] REFERRING PROVIDER: Hope Pigeon MD   END OF SESSION:  PT End of Session - 02/22/23 1449     Visit Number 25    Number of Visits 41    Date for PT Re-Evaluation 04/11/23    Authorization Type [employer] COMM PRO    Authorization Time Period 11/03/22-01/26/23    PT Start Time 1448    PT Stop Time 1530    PT Time Calculation (min) 42 min    Equipment Utilized During Treatment Gait belt    Activity Tolerance Patient tolerated treatment well    Behavior During Therapy WFL for tasks assessed/performed              Past Medical History:  Diagnosis Date   CVA (cerebral vascular accident) (HCC)    Hypertension    Past Surgical History:  Procedure Laterality Date   TOTAL HIP ARTHROPLASTY Right    Patient Active Problem List   Diagnosis Date Noted   Sepsis due to pneumonia (HCC) 01/09/2023   Severe sepsis (HCC) 01/09/2023   UTI (urinary tract infection) 01/09/2023   Stroke (HCC) 01/09/2023   HTN (hypertension) 01/09/2023   HLD (hyperlipidemia) 01/09/2023   Hypokalemia 01/09/2023   Tobacco use 01/09/2023   Obesity (BMI 30-39.9) 01/09/2023   CAP (community acquired pneumonia) 01/09/2023   Hypomagnesemia 01/09/2023   Hypophosphatemia 01/09/2023   TIA (transient ischemic attack) 01/05/2023   GERD (gastroesophageal reflux disease) 02/26/2021   S/P hip replacement, right 03/04/2020   Primary osteoarthritis of right hip 01/24/2020   Chronic pain of right knee 11/23/2019   Chronic right hip pain 11/23/2019   Osteoarthritis of knee 11/13/2018   History of adenomatous polyp of colon 02/14/2018   History of Barrett's esophagus 02/14/2018    ONSET DATE: January 2024  REFERRING DIAG: weakness due to stroke   THERAPY DIAG:  Muscle weakness (generalized)  Other lack of coordination  Visual  disturbance following cerebrovascular accident (CVA)  Difficulty in walking, not elsewhere classified  Unsteadiness on feet  Rationale for Evaluation and Treatment: Rehabilitation  SUBJECTIVE:                                                                                                                                                                                             SUBJECTIVE STATEMENT:  Pt reports doing well today. No new updates since prior session.   Pt accompanied by: significant other  PERTINENT HISTORY: Patient presents to physical therapy for weakness  s/p stroke . 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 Per documentation he had a secondary stroke during the hospital stay as well as three seizures. Patient was admitted to [facility] inpatient rehabilitation from 08/10/22-08/31/22 where he was discharged home. Patient was discharged from Home health on 10/28/22 (PT, OT, and ST). PMH includes GERD, OA, R knee/hip pain, Barrett's esophagus. Is using a walker, still drags right foot. Needs assistance for transfers but can bath and dress self.   PAIN:  Are you having pain? No  PRECAUTIONS: Fall  WEIGHT BEARING RESTRICTIONS: No  FALLS: Has patient fallen in last 6 months? No    PATIENT GOALS: walking with less AD, get back to mowing the yard, gardening, grilling     OBJECTIVE:   TODAY'S TREATMENT: DATE: 02/22/23  Gait without AD 4 x 44ft with CGA fading to min assist for last 10-75ft of each bout due to foot drag on the RLE with fatigue.  Sit<>stand 2x 10 with UE to push from arm rests.    gait with Visual scanning with locate obstacles through gym; performed x 3 to locate and pick up 7 cones on the R and L . Moderate cues for visual scanning to the R to find and grasp cones on each bout as well as attention to the RLE to prevent foot drag when fatigued.   Step up across airex pad and 4 inch step x 4 with min assist and  moderate cues for attention to the RLE to prevent stepping off right edge of aerobic step.  Lateral stepping over narrow airex beam x 8 bil with HHA while stepping over with RLE to allow full step height/length. Throughout session PT provided contact-guard assist to min assist for improved safety and improve weight shift allowing increased step length and width on the right lower extremity when fatigued.  Patient required constant cues for increased step with with any lateral movement.  PATIENT EDUCATION: Education details: goals, POC, HEP Pt educated throughout session about proper posture and technique with exercises. Improved exercise technique, movement at target joints, use of target muscles after min to mod verbal, visual, tactile cues.  Person educated: Patient and Spouse Education method: Explanation, Demonstration, Tactile cues, and Verbal cues Education comprehension: verbalized understanding, returned demonstration, verbal cues required, and tactile cues required  HOME EXERCISE PROGRAM:  Access Code: [email or web address] URL: [email or web address] Date: 12/23/2022 Prepared by: Grier Rocher  Exercises - Standing Tandem Balance with Counter Support  - 1 x daily - 5 x weekly - 3 sets - 4 reps - 10 hold - Standing Single Leg Stance with Counter Support  - 1 x daily - 5 x weekly - 3 sets - 4 reps - 10 hold  Access Code: [email or web address] URL: [email or web address] Date: 11/03/2022 Prepared by: Precious Bard  Exercises - Seated March  - 1 x daily - 7 x weekly - 2 sets - 10 reps - 5 hold - Seated Long Arc Quad  - 1 x daily - 7 x weekly - 2 sets - 10 reps - 5 hold - Seated Heel Toe Raises  - 1 x daily - 7 x weekly - 2 sets - 10 reps - 5 hold - Seated Hip Abduction  - 1 x daily - 7 x weekly - 2 sets - 10 reps - 5 hold - Seated Isometric Hip Adduction with Ball  - 1 x daily - 7 x weekly - 2 sets - 10 reps - 5 hold  GOALS:  Goals reviewed with patient? Yes  SHORT TERM GOALS:  Target date: 01/17/2023  Patient will be independent in home exercise program to improve strength/mobility for better functional independence with ADLs. Baseline:4/24: HEP given  Goal status: IN PROGRESS    LONG TERM GOALS: Target date: 01/26/2023  Patient will increase FOTO score to equal to or greater than  56 to demonstrate statistically significant improvement in mobility and quality of life.   Baseline: 40 6/3: 41 7/8:54 7/23: 52 Goal status: REVISED  2.  Patient (> 27 years old) will complete five times sit to stand test in < 15 seconds indicating an increased LE strength and improved balance. Baseline: 4/24: 38.37 with BUE support 6/3: 12.02 sec with UE support 7/8: 18.12 sec without UE assist 7/23: 16.93 without UE assist  Goal status: IN PROGRESS  3.  Patient will increase 10 meter walk test to >1.45m/s as to improve gait speed for better community ambulation and to reduce fall risk. Baseline: 4/24: 19.2 seconds with RW; dragging of R foot  6/3: 17.175 with no AD and CGA from PT.  7/8: 14.6 sec without AD (0.35m/s) 7/23: 13.21sec without AD (0.49m/s)   Goal status: IN PROGRESS  4.  Patient will increase Berg Balance score by > 45points to demonstrate decreased fall risk during functional activities. Baseline: 4/24: see next session 5/2: 30/56 6/3: 31/56 7/8 42/56  7/23:41/56  Goal status: REVISED In progress    5.  Patient will increase 2 min walk test  >300 ft as to demonstrate reduced fall risk and improved dynamic gait balance for better safety with community/home ambulation.   Baseline: 201 7/23: 245 with min assist due to heavy R foot drag for last 83ft  Goal status: REVISED in progress,      ASSESSMENT:  CLINICAL IMPRESSION:   Patient appeared ready for treatment on this day. PT treatment focuased on gait without AD and forced attention to the R side to navigate and locate obstacles on the R side as well as simulated community gait to step up/down  curb and unlevel surfaces. Continues to require increasing cues for attention to the R with fatigue to reduced fall risk. Pt will continue to benefit from skilled therapy to address remaining deficits in order to improve overall QoL and return to PLOF.      OBJECTIVE IMPAIRMENTS: Abnormal gait, cardiopulmonary status limiting activity, decreased activity tolerance, decreased balance, decreased cognition, decreased coordination, decreased endurance, decreased knowledge of condition, decreased knowledge of use of DME, decreased mobility, difficulty walking, decreased strength, decreased safety awareness, impaired perceived functional ability, impaired flexibility, impaired UE functional use, impaired vision/preception, improper body mechanics, and postural dysfunction.   ACTIVITY LIMITATIONS: carrying, lifting, bending, sitting, standing, squatting, sleeping, stairs, transfers, bed mobility, bathing, toileting, reach over head, hygiene/grooming, locomotion level, and caring for others  PARTICIPATION LIMITATIONS: meal prep, cleaning, laundry, medication management, personal finances, interpersonal relationship, driving, shopping, community activity, and yard work  PERSONAL FACTORS: Age, Past/current experiences, Time since onset of injury/illness/exacerbation, Transportation, and 3+ comorbidities: GERD, OA, R knee/hip pain, Barrett's esophagus  are also affecting patient's functional outcome.   REHAB POTENTIAL: Good  CLINICAL DECISION MAKING: Evolving/moderate complexity  EVALUATION COMPLEXITY: Moderate  PLAN:  PT FREQUENCY: 2x/week  PT DURATION: 12 weeks  PLANNED INTERVENTIONS: Therapeutic exercises, Therapeutic activity, Neuromuscular re-education, Balance training, Gait training, Patient/Family education, Self Care, Joint mobilization, Joint manipulation, Stair training, Vestibular training, Canalith repositioning, Visual/preceptual remediation/compensation, Orthotic/Fit training, Electrical  stimulation, Wheelchair mobility training, Spinal mobilization, Cryotherapy, Moist heat, Splintting,  Taping, Ultrasound, Ionotophoresis 4mg /ml Dexamethasone, Manual therapy, and Re-evaluation  PLAN FOR NEXT SESSION:   Endurance training BLE strengthening. Attention to the R side     Grier Rocher PT, DPT  Physical Therapist - [facility]  [facility]  2:51 PM 02/22/23

## 2023-02-22 NOTE — Therapy (Signed)
OUTPATIENT OCCUPATIONAL THERAPY TREATMENT NOTE    Patient Name: Mark Durham MRN: [id number] DOB:1951/07/03, 72 y.o., male Today's Date: 02/22/2023  PCP: [facility] REFERRING PROVIDER: Hope Pigeon, MD  END OF SESSION:  OT End of Session - 02/22/23 1558     Visit Number 15    Number of Visits 24    Date for OT Re-Evaluation 04/11/23    OT Start Time 1535    OT Stop Time 1615    OT Time Calculation (min) 40 min    Activity Tolerance Patient tolerated treatment well    Behavior During Therapy [facility] for tasks assessed/performed               Past Medical History:  Diagnosis Date   CVA (cerebral vascular accident) (HCC)    Hypertension    Past Surgical History:  Procedure Laterality Date   TOTAL HIP ARTHROPLASTY Right    Patient Active Problem List   Diagnosis Date Noted   Sepsis due to pneumonia (HCC) 01/09/2023   Severe sepsis (HCC) 01/09/2023   UTI (urinary tract infection) 01/09/2023   Stroke (HCC) 01/09/2023   HTN (hypertension) 01/09/2023   HLD (hyperlipidemia) 01/09/2023   Hypokalemia 01/09/2023   Tobacco use 01/09/2023   Obesity (BMI 30-39.9) 01/09/2023   CAP (community acquired pneumonia) 01/09/2023   Hypomagnesemia 01/09/2023   Hypophosphatemia 01/09/2023   TIA (transient ischemic attack) 01/05/2023   GERD (gastroesophageal reflux disease) 02/26/2021   S/P hip replacement, right 03/04/2020   Primary osteoarthritis of right hip 01/24/2020   Chronic pain of right knee 11/23/2019   Chronic right hip pain 11/23/2019   Osteoarthritis of knee 11/13/2018   History of adenomatous polyp of colon 02/14/2018   History of Barrett's esophagus 02/14/2018   ONSET DATE:  07/25/2022  REFERRING DIAG:  CVA  THERAPY DIAG:  Muscle weakness (generalized)  Rationale for Evaluation and Treatment: Rehabilitation  SUBJECTIVE:  SUBJECTIVE STATEMENT: Pt reports his theraputty is too easy.   Accompanied by: self  PERTINENT HISTORY:   02/01/2023  Addendum Pt. Had recent ED visit from OT on 01/27/2023 due to concerns for high blood pressure and headache. Initially was associated with blurry vision although he resolved at ED. CT scan without concerning abnormality and Pt. Was given medications and discharged.    01/17/2023 Addendum:  Pt. had 2 recent ED visits with hospitalizations. On 01/05/23 Pt. was hospitalized due sudden headache, slurred speech, and right sided facial drop. CT scan of the head showed no acute intracranial abnormality however shoes chronic small vessel ischemic disease. CT angio of the head did not show any LVO or other emergent finding. 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 MRI resulted in no acute finding however showed "Asymmetric T2/FLAIR signal intensity involving the right transverse and sigmoid sinuses as well as the right jugular bulb". Pt. was hospitalized again 01/09/2023 due to severe sepsis and was discharged 01/12/2023.  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 Per chart review, Pt. had a secondary stroke during the hospital stay as well as three seizures. Patient was admitted to [facility] Acute inpatient rehabilitation from 08/10/22-08/31/22 where he was discharged home. Patient was discharged from Home health on 10/28/22 (PT, OT, and ST). PMH includes: GERD, OA, R knee/hip pain, Barrett's esophagus. Pt. Had recent ED visit on 01/05/23 due sudden headache, slurred speech, and right sided facial drop. CT  scan of the head showed no acute intracranial abnormality however shoes chronic small vessel ischemic disease. CT angio of the head did not show any LVO or other emergent finding. 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 MRI  resulted in no acute finding however showed "Asymmetric T2/FLAIR signal intensity involving the right transverse and sigmoid sinuses as well as the right jugular bulb". Pt. Was hospitalized 01/09/2023 due to severe sepsis and was discharged 01/12/2023.  PRECAUTIONS: None  WEIGHT BEARING RESTRICTIONS: No  PAIN:  Are you having pain? No  FALLS: Has patient fallen in last 6 months? No  LIVING ENVIRONMENT: Lives with: lives with their spouse Lives in: House/apartment Stairs: 5 steps to enter; one level home Has following equipment at home: Environmental consultant - 2 wheeled, Wheelchair (manual), shower chair, and bed side commode  PLOF: Independent  PATIENT GOALS: To get to walking  OBJECTIVE:   HAND DOMINANCE: Right  ADLs: Transfers/ambulation related to ADLs: Eating: Independent with utensils, assist with cutting food.  Grooming: Independent oral care, wife assists with shaving. UB Dressing: Independent LB Dressing: independent Toileting: Independent Bathing: Once in the shower Supervision with bathing Tub Shower transfers: CGA with showere chair  IADLs: Shopping: Wife performs Light housekeeping: Wife performs most IADLs Meal Prep:  Meal preparation Community mobility: Relies on family and friends for driving. Medication management: Wife sets up, and provides medication Financial management: Wife completes Handwriting:  Pt. Wife reports Difficulty with identifying letters, and numbers   MOBILITY STATUS: Uses a rolling walker  POSTURE COMMENTS:  No Significant postural limitations  ACTIVITY TOLERANCE: Activity tolerance:  Fair  FUNCTIONAL OUTCOME MEASURES: FOTO: 53  TR score: 55 01/17/23: FOTO: 61 TR score: 55  UPPER EXTREMITY ROM:    Active ROM Right Eval [facility] Left Eval [facility] Right 01/17/2023 [facility] R 01/17/2023 [facility]  Shoulder flexion      Shoulder abduction      Shoulder adduction      Shoulder extension      Shoulder internal rotation      Shoulder external rotation       Elbow flexion      Elbow extension      Wrist flexion      Wrist extension      Wrist ulnar deviation      Wrist radial deviation      Wrist pronation      Wrist supination      (Blank rows = not tested)  UPPER EXTREMITY MMT:     MMT Right Eval 4/5 Left Eval 4+/5 Right 01/17/23 Left 01/17/23  Shoulder flexion 4-/5 4+/5 4+/5 4+/5  Shoulder abduction  l 4/5 4+/5  Shoulder adduction      Shoulder extension      Shoulder internal rotation      Shoulder external rotation      Middle trapezius      Lower trapezius      Elbow flexion 4+/5 5/5 4+/5 5/5  Elbow extension 4+/5 5/5 4+/5 5/5  Wrist flexion      Wrist extension 4/5 4+/5 4+/5 4+/5  Wrist ulnar deviation      Wrist radial deviation      Wrist pronation      Wrist supination      (Blank rows = not tested)  HAND FUNCTION: Grip strength: Right: 35 lbs; Left: 25 lbs, Lateral pinch: Right: 13 lbs, Left: 11 lbs, and 3 point pinch:  Right: 11 lbs, Left: Pt. Unable to assume 3pt. Pinch position 01/17/2023: Grip strength: Right: 45 lbs; Left: 42 lbs, Lateral pinch: Right: 23 lbs, Left: 22 lbs, and 3 point pinch: Right: 18 lbs, Left: 18  COORDINATION: 9 Hole Peg test: Right: placement/removal of 5 pegs in 1 min. & 5 sec; Left: 50 sec Pt. Has difficulty seeing items in the dish on the right side. Pt. Kept moving the vertical pegs between holes with the right 01/17/23: 9 Hole Peg test: Right:1 minute 6 seconds Left: 41 sec Pt. Has difficulty seeing items in the dish on the right side.  SENSATION: Light touch: WFL Proprioception: Impaired   EDEMA: Intact   MUSCLE TONE: RUE: Mild flexor tone. Wife reports right hand presents with flexion in the right hand  COGNITION: Overall cognitive status:  Pt.'s wife reports noticing the Pt. has difficulty with numbers, and letters.  VISION:    Wife reports the Pt. frequently misses items on the right, and bumping into obstacle on the right   VISION ASSESSMENT: To be further assessed in  functional context  11/24/22: impaired smooth pursuits all quadrants, requiring additional head turns; impaired saccades (slow pace, requiring head turns).  Visual fields appear grossly intact, but difficult to assess d/t decreased attention and ability to consistently follow a 2 step command. Moderate-severe R sided inattention noted.  Pt. Has a neuro optometrist appointment scheduled for June.   PERCEPTION:  TBD  PRAXIS: Impaired: Motor planning  TODAY'S TREATMENT:                                                                                                                              DATE: 02/22/2023    Self-care skills:  Pt. worked on visually scanning through words placed randomly at the tabletop surface. Pt. worked on organizing the words into 2 lists of animals, and colors. Pt. worked on Engineering geologist of the words into 2 lists in printed form on lined paper spaced 1/2" apart. Pt. copied each word. Pt. worked on formulating the words accurately, and legibly.  PATIENT EDUCATION: Education details: Careers information officer Person educated: Patient, spouse Education method: explanation, vc, demo Education comprehension: pt/spouse verbalized understanding; further training needed  HOME EXERCISE PROGRAM: [place] theraputty; recommended solitaire for increasing R sided visual attention, handwriting practice  GOALS: Goals reviewed with patient?  Yes   SHORT TERM GOALS: Target date: 02/28/23  Pt. Will require Supervision for bilateral UE HEPs.  Baseline: Eval: No current HEP; 01/19/23: pt completes theraputty exercises for R hand strength/coordination; pt/spouse have been provided with activities to increase attention to R visual field, and handwriting activities.  Goal status: Ongoing  LONG TERM GOALS: Target date: 04/11/2023    Pt. Will increase FOTO score by 2 points for Pt. perceived improvement with assessment specific ADLs, and IADLs  Baseline: Eval:  FOTO score: 53 TR score:  55 01/17/2023: FOTO score: 61 TR: 55 Goal status: Met/Achieved  2.  Pt. Will increase  bilateral UE strength by 2 mm grades overall to assist with ADLs, and IADLs Baseline: Eval: Right:Shoulder flexion 4/5, abduction 4-/5, elbow flexion 4+/5, extension 4+/5, wrist extension 4/5 Left Shoulder flexion 4+/5, abduction 4+/5, elbow flexion 5/5, extension 5/5, wrist extension: 4+/5 01/17/2023: Right:Shoulder flexion 4+/5, abduction 4/5, elbow flexion 4+/5, extension 4+/5, wrist extension 4+/5 Left Shoulder flexion 4+/5, abduction 4+/5, elbow flexion 5/5, extension 5/5, wrist extension: 4+/5 Goal status: Ongoing  3.  Pt. Will improve bilateral hand [facility] skills by 10 sec. In order to be able to manipulate objects during ADLs, and IADLs Baseline: Eval: Right: Pt. Placed/removed 5 pegs in 1 min, & 5 sec. Pt. Kept moving the vertical pegs between holes with the right.  Left: 50 sec; 01/17/2023: 9 Hole Peg test: Right:1 minute 6 seconds Left: 41 sec  Goal status: Ongoing  4.  Pt will consistently implement visual compensatory strategies for ADLs, and IADLs Baseline: Eval: Pt. Currently does not utilize. Education to be provided. 01/17/2023: Education provided, continue to implement Goal status: Ongoing  5. Pt. Will improve bilateral grip strength to be able to securely hold items for ADLs, and IADLs. Baseline Eval: Right: 35 lbs; Left: 25 lbs; 01/17/2023: Right: 45 lbs; Left: 42 lbs, Goal status: Ongoing  6. Pt. Will improve bilateral lateral pinch strength to be able to securely hold items for ADLs, and IADLs. Baseline: Right: 13# , Left: 11#; 01/17/23: Right: 23# , Left: 22# Goal status: Ongoing  7. Pt will print first and last name with min vc or less to be able to sign a greeting card. Baseline: 01/19/23: Able to print first name with extra time, and was able to print last name only when copying it, otherwise pt could only construct 4 of the 7 letters in his last name, and letters were not in the correct  sequence. Goal status: New  ASSESSMENT:  CLINICAL IMPRESSION:  Pt. was able to accurately visually scan, and sort through lists of animals, and colors placed randomly at the tabletop surface with 100% accuracy. Pt. was able to formulate letters in printed form with 50% legibility. Pt. misidentified the letters needed to formulate the sequence of the letters needed for correct spelling.  Pt. presented with no deviation from the line in the row on the left side of the paper, however presented with multiple deviations below the line in the row  on the right side of the paper. Pt. continues to benefit from OT services to continue to increase attention to R visual field, improve basic handwriting skills, and review visual, and cognitive compensatory strategies in order to improve, and maximize independence with ADLs, and IADL tasks.   PERFORMANCE DEFICITS: in functional skills including ADLs, IADLs, coordination, dexterity, ROM, strength, Fine motor control, Gross motor control, decreased knowledge of use of DME, and UE functional use, cognitive skills including, and psychosocial skills including.   IMPAIRMENTS: are limiting patient from ADLs, IADLs, play, and leisure.   CO-MORBIDITIES: may have co-morbidities  that affects occupational performance. Patient will benefit from skilled OT to address above impairments and improve overall function.  MODIFICATION OR ASSISTANCE TO COMPLETE EVALUATION: Min-Moderate modification of tasks or assist with assess necessary to complete an evaluation.  OT OCCUPATIONAL PROFILE AND HISTORY: Detailed assessment: Review of records and additional review of physical, cognitive, psychosocial history related to current functional performance.  CLINICAL DECISION MAKING: Moderate - several treatment options, min-mod task modification necessary  REHAB POTENTIAL: Good  EVALUATION COMPLEXITY: Moderate  PLAN:  OT FREQUENCY: 2x/week  OT  DURATION: 12 weeks  PLANNED  INTERVENTIONS: self care/ADL training, therapeutic exercise, therapeutic activity, neuromuscular re-education, manual therapy, and functional mobility training  RECOMMENDED OTHER SERVICES:  OT and ST  CONSULTED AND AGREED WITH PLAN OF CARE: Patient  PLAN FOR NEXT SESSION: HEP  Olegario Messier, MS, OTR/L  02/22/23, 4:04 PM  ascom [phone]

## 2023-02-24 ENCOUNTER — Ambulatory Visit: Payer: BC Managed Care – PPO

## 2023-02-24 ENCOUNTER — Encounter: Payer: BC Managed Care – PPO | Admitting: Speech Pathology

## 2023-02-24 ENCOUNTER — Ambulatory Visit: Payer: BC Managed Care – PPO | Admitting: Physical Therapy

## 2023-02-24 DIAGNOSIS — R262 Difficulty in walking, not elsewhere classified: Secondary | ICD-10-CM

## 2023-02-24 DIAGNOSIS — R2681 Unsteadiness on feet: Secondary | ICD-10-CM

## 2023-02-24 DIAGNOSIS — R278 Other lack of coordination: Secondary | ICD-10-CM

## 2023-02-24 DIAGNOSIS — I69398 Other sequelae of cerebral infarction: Secondary | ICD-10-CM

## 2023-02-24 DIAGNOSIS — M6281 Muscle weakness (generalized): Secondary | ICD-10-CM | POA: Diagnosis not present

## 2023-02-24 NOTE — Therapy (Signed)
OUTPATIENT OCCUPATIONAL THERAPY TREATMENT NOTE    Patient Name: Mark Durham MRN: [id number] DOB:1951/06/05, 72 y.o., male Today's Date: 02/24/2023  PCP: [facility] REFERRING PROVIDER: Hope Pigeon, MD  END OF SESSION:  OT End of Session - 02/24/23 1639     Visit Number 16    Number of Visits 24    Date for OT Re-Evaluation 04/11/23    Progress Note Due on Visit 10    OT Start Time 1615    OT Stop Time 1700    OT Time Calculation (min) 45 min    Equipment Utilized During Treatment transport chair    Activity Tolerance Patient tolerated treatment well    Behavior During Therapy WFL for tasks assessed/performed               Past Medical History:  Diagnosis Date   CVA (cerebral vascular accident) (HCC)    Hypertension    Past Surgical History:  Procedure Laterality Date   TOTAL HIP ARTHROPLASTY Right    Patient Active Problem List   Diagnosis Date Noted   Sepsis due to pneumonia (HCC) 01/09/2023   Severe sepsis (HCC) 01/09/2023   UTI (urinary tract infection) 01/09/2023   Stroke (HCC) 01/09/2023   HTN (hypertension) 01/09/2023   HLD (hyperlipidemia) 01/09/2023   Hypokalemia 01/09/2023   Tobacco use 01/09/2023   Obesity (BMI 30-39.9) 01/09/2023   CAP (community acquired pneumonia) 01/09/2023   Hypomagnesemia 01/09/2023   Hypophosphatemia 01/09/2023   TIA (transient ischemic attack) 01/05/2023   GERD (gastroesophageal reflux disease) 02/26/2021   S/P hip replacement, right 03/04/2020   Primary osteoarthritis of right hip 01/24/2020   Chronic pain of right knee 11/23/2019   Chronic right hip pain 11/23/2019   Osteoarthritis of knee 11/13/2018   History of adenomatous polyp of colon 02/14/2018   History of Barrett's esophagus 02/14/2018   ONSET DATE:  07/25/2022  REFERRING DIAG:  CVA  THERAPY DIAG:  Muscle weakness (generalized)  Other lack of coordination  Visual disturbance following cerebrovascular accident (CVA)  Rationale for  Evaluation and Treatment: Rehabilitation  SUBJECTIVE:  SUBJECTIVE STATEMENT: Pt reports he still sometimes has some troubles with picking up things with his R hand and seeing things on his R side. Accompanied by: self  PERTINENT HISTORY:   02/01/2023 Addendum Pt. Had recent ED visit from OT on 01/27/2023 due to concerns for high blood pressure and headache. Initially was associated with blurry vision although he resolved at ED. CT scan without concerning abnormality and Pt. Was given medications and discharged.    01/17/2023 Addendum:  Pt. had 2 recent ED visits with hospitalizations. On 01/05/23 Pt. was hospitalized due sudden headache, slurred speech, and right sided facial drop. CT scan of the head showed no acute intracranial abnormality however shoes chronic small vessel ischemic disease. CT angio of the head did not show any LVO or other emergent finding. 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 MRI resulted in no acute finding however showed "Asymmetric T2/FLAIR signal intensity involving the right transverse and sigmoid sinuses as well as the right jugular bulb". Pt. was hospitalized again 01/09/2023 due to severe sepsis and was discharged 01/12/2023.  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 Per chart review, Pt. had a secondary stroke during the hospital stay as well as three seizures. Patient was admitted to [facility] Acute inpatient rehabilitation from 08/10/22-08/31/22  where he was discharged home. Patient was discharged from Home health on 10/28/22 (PT, OT, and ST). PMH includes: GERD, OA, R knee/hip pain, Barrett's esophagus. Pt. Had recent ED visit on 01/05/23 due sudden headache, slurred speech, and right sided facial drop. CT scan of the head showed no acute intracranial abnormality however shoes chronic small vessel  ischemic disease. CT angio of the head did not show any LVO or other emergent finding. 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 MRI resulted in no acute finding however showed "Asymmetric T2/FLAIR signal intensity involving the right transverse and sigmoid sinuses as well as the right jugular bulb". Pt. Was hospitalized 01/09/2023 due to severe sepsis and was discharged 01/12/2023.  PRECAUTIONS: None  WEIGHT BEARING RESTRICTIONS: No  PAIN:  Are you having pain? No  FALLS: Has patient fallen in last 6 months? No  LIVING ENVIRONMENT: Lives with: lives with their spouse Lives in: House/apartment Stairs: 5 steps to enter; one level home Has following equipment at home: Environmental consultant - 2 wheeled, Wheelchair (manual), shower chair, and bed side commode  PLOF: Independent  PATIENT GOALS: To get to walking  OBJECTIVE:   HAND DOMINANCE: Right  ADLs: Transfers/ambulation related to ADLs: Eating: Independent with utensils, assist with cutting food.  Grooming: Independent oral care, wife assists with shaving. UB Dressing: Independent LB Dressing: independent Toileting: Independent Bathing: Once in the shower Supervision with bathing Tub Shower transfers: CGA with showere chair  IADLs: Shopping: Wife performs Light housekeeping: Wife performs most IADLs Meal Prep:  Meal preparation Community mobility: Relies on family and friends for driving. Medication management: Wife sets up, and provides medication Financial management: Wife completes Handwriting:  Pt. Wife reports Difficulty with identifying letters, and numbers   MOBILITY STATUS: Uses a rolling walker  POSTURE COMMENTS:  No Significant postural limitations  ACTIVITY TOLERANCE: Activity tolerance:  Fair  FUNCTIONAL OUTCOME MEASURES: FOTO: 53  TR score: 55 01/17/23: FOTO: 61 TR score: 55  UPPER EXTREMITY  ROM:    Active ROM Right Eval [facility] Left Eval [facility] Right 01/17/2023 [facility] R 01/17/2023 [facility]  Shoulder flexion      Shoulder abduction      Shoulder adduction      Shoulder extension      Shoulder internal rotation      Shoulder external rotation      Elbow flexion      Elbow extension      Wrist flexion      Wrist extension      Wrist ulnar deviation      Wrist radial deviation      Wrist pronation      Wrist supination      (Blank rows = not tested)  UPPER EXTREMITY MMT:     MMT Right Eval 4/5 Left Eval 4+/5 Right 01/17/23 Left 01/17/23  Shoulder flexion 4-/5 4+/5 4+/5 4+/5  Shoulder abduction  l 4/5 4+/5  Shoulder adduction      Shoulder extension      Shoulder internal rotation      Shoulder external rotation      Middle trapezius      Lower trapezius      Elbow flexion 4+/5 5/5 4+/5 5/5  Elbow extension 4+/5 5/5 4+/5 5/5  Wrist flexion      Wrist extension 4/5 4+/5 4+/5 4+/5  Wrist ulnar deviation      Wrist radial deviation  Wrist pronation      Wrist supination      (Blank rows = not tested)  HAND FUNCTION: Grip strength: Right: 35 lbs; Left: 25 lbs, Lateral pinch: Right: 13 lbs, Left: 11 lbs, and 3 point pinch: Right: 11 lbs, Left: Pt. Unable to assume 3pt. Pinch position 01/17/2023: Grip strength: Right: 45 lbs; Left: 42 lbs, Lateral pinch: Right: 23 lbs, Left: 22 lbs, and 3 point pinch: Right: 18 lbs, Left: 18  COORDINATION: 9 Hole Peg test: Right: placement/removal of 5 pegs in 1 min. & 5 sec; Left: 50 sec Pt. Has difficulty seeing items in the dish on the right side. Pt. Kept moving the vertical pegs between holes with the right 01/17/23: 9 Hole Peg test: Right:1 minute 6 seconds Left: 41 sec Pt. Has difficulty seeing items in the dish on the right side.  SENSATION: Light touch: WFL Proprioception: Impaired   EDEMA: Intact   MUSCLE TONE: RUE: Mild flexor tone. Wife reports right hand presents with flexion in the right hand  COGNITION: Overall  cognitive status:  Pt.'s wife reports noticing the Pt. has difficulty with numbers, and letters.  VISION:    Wife reports the Pt. frequently misses items on the right, and bumping into obstacle on the right   VISION ASSESSMENT: To be further assessed in functional context  11/24/22: impaired smooth pursuits all quadrants, requiring additional head turns; impaired saccades (slow pace, requiring head turns).  Visual fields appear grossly intact, but difficult to assess d/t decreased attention and ability to consistently follow a 2 step command. Moderate-severe R sided inattention noted.  Pt. Has a neuro optometrist appointment scheduled for June.   PERCEPTION:  TBD  PRAXIS: Impaired: Motor planning  TODAY'S TREATMENT:                                                                                                                              DATE: 02/24/2023  Therapeutic Activity: Facilitated R hand FMC/dexterity skills working to pick up and place Jamar pegs, moving pegs with his R hand from the horizontal position in the dish, to a vertical position on the pegboard.  Pt practiced removing pegs using fine tip tweezers.  Pt practiced visual search strategies searing through scattered alphabet dice on table top to form his first and last name.  Pt then practiced storing 3 dice in hand, and using translatory skills to move 1 dice at a time from palm to fingertips, then rotating the dice to place a selected letter right side up.    PATIENT EDUCATION: Education details: R hand FMC/dexterity skills Person educated: Patient Education method: explanation, vc, demo Education comprehension: pt/spouse verbalized understanding  HOME EXERCISE PROGRAM: [place] theraputty; recommended solitaire for increasing R sided visual attention, handwriting practice  GOALS: Goals reviewed with patient?  Yes   SHORT TERM GOALS: Target date: 02/28/23  Pt. Will require Supervision for bilateral UE HEPs.   Baseline: Eval: No current HEP; 01/19/23: pt completes  theraputty exercises for R hand strength/coordination; pt/spouse have been provided with activities to increase attention to R visual field, and handwriting activities.  Goal status: Ongoing  LONG TERM GOALS: Target date: 04/11/2023    Pt. Will increase FOTO score by 2 points for Pt. perceived improvement with assessment specific ADLs, and IADLs  Baseline: Eval:  FOTO score: 53 TR score: 55 01/17/2023: FOTO score: 61 TR: 55 Goal status: Met/Achieved  2.  Pt. Will increase bilateral UE strength by 2 mm grades overall to assist with ADLs, and IADLs Baseline: Eval: Right:Shoulder flexion 4/5, abduction 4-/5, elbow flexion 4+/5, extension 4+/5, wrist extension 4/5 Left Shoulder flexion 4+/5, abduction 4+/5, elbow flexion 5/5, extension 5/5, wrist extension: 4+/5 01/17/2023: Right:Shoulder flexion 4+/5, abduction 4/5, elbow flexion 4+/5, extension 4+/5, wrist extension 4+/5 Left Shoulder flexion 4+/5, abduction 4+/5, elbow flexion 5/5, extension 5/5, wrist extension: 4+/5 Goal status: Ongoing  3.  Pt. Will improve bilateral hand [facility] skills by 10 sec. In order to be able to manipulate objects during ADLs, and IADLs Baseline: Eval: Right: Pt. Placed/removed 5 pegs in 1 min, & 5 sec. Pt. Kept moving the vertical pegs between holes with the right.  Left: 50 sec; 01/17/2023: 9 Hole Peg test: Right:1 minute 6 seconds Left: 41 sec  Goal status: Ongoing  4.  Pt will consistently implement visual compensatory strategies for ADLs, and IADLs Baseline: Eval: Pt. Currently does not utilize. Education to be provided. 01/17/2023: Education provided, continue to implement Goal status: Ongoing  5. Pt. Will improve bilateral grip strength to be able to securely hold items for ADLs, and IADLs. Baseline Eval: Right: 35 lbs; Left: 25 lbs; 01/17/2023: Right: 45 lbs; Left: 42 lbs, Goal status: Ongoing  6. Pt. Will improve bilateral lateral pinch strength to be able to  securely hold items for ADLs, and IADLs. Baseline: Right: 13# , Left: 11#; 01/17/23: Right: 23# , Left: 22# Goal status: Ongoing  7. Pt will print first and last name with min vc or less to be able to sign a greeting card. Baseline: 01/19/23: Able to print first name with extra time, and was able to print last name only when copying it, otherwise pt could only construct 4 of the 7 letters in his last name, and letters were not in the correct sequence. Goal status: New  ASSESSMENT:  CLINICAL IMPRESSION: Pt reported that he is still having some difficulty with picking up small items with his R hand.  Pt demonstrated consistent ability to manipulate a variety of small objects today, including Jamar pegs, narrow tip tweezers, and alphabet dice, with good ability.  Anticipate pt's visual scanning deficits to be the primary challenge in what pt perceives as a challenge when picking up and manipulating small items, as FMC/dexterity skills were WNL for the tasks noted above.  Pt continues to require extra time for scanning to his R visual field, but is improving with initiating his own head turns to find objects in front of him at the table top level.  Pt. continues to benefit from OT services to continue to increase attention to R visual field, improve basic handwriting skills, and review visual, and cognitive compensatory strategies in order to improve, and maximize independence with ADLs, and IADL tasks.   PERFORMANCE DEFICITS: in functional skills including ADLs, IADLs, coordination, dexterity, ROM, strength, Fine motor control, Gross motor control, decreased knowledge of use of DME, and UE functional use, cognitive skills including, and psychosocial skills including.   IMPAIRMENTS: are limiting patient from ADLs,  IADLs, play, and leisure.   CO-MORBIDITIES: may have co-morbidities  that affects occupational performance. Patient will benefit from skilled OT to address above impairments and improve overall  function.  MODIFICATION OR ASSISTANCE TO COMPLETE EVALUATION: Min-Moderate modification of tasks or assist with assess necessary to complete an evaluation.  OT OCCUPATIONAL PROFILE AND HISTORY: Detailed assessment: Review of records and additional review of physical, cognitive, psychosocial history related to current functional performance.  CLINICAL DECISION MAKING: Moderate - several treatment options, min-mod task modification necessary  REHAB POTENTIAL: Good  EVALUATION COMPLEXITY: Moderate  PLAN:  OT FREQUENCY: 2x/week  OT DURATION: 12 weeks  PLANNED INTERVENTIONS: self care/ADL training, therapeutic exercise, therapeutic activity, neuromuscular re-education, manual therapy, and functional mobility training  RECOMMENDED OTHER SERVICES:  OT and ST  CONSULTED AND AGREED WITH PLAN OF CARE: Patient  PLAN FOR NEXT SESSION: HEP  Danelle Earthly, MS, OTR/L

## 2023-02-24 NOTE — Therapy (Signed)
OUTPATIENT PHYSICAL THERAPY NEURO TREATMENT     Patient Name: Mark Durham MRN: [id number] DOB:1951/03/06, 72 y.o., male Today's Date: 02/24/2023   PCP: [facility] REFERRING PROVIDER: Hope Pigeon MD   END OF SESSION:  PT End of Session - 02/24/23 1600     Visit Number 26    Number of Visits 41    Date for PT Re-Evaluation 04/11/23    Authorization Type [employer] COMM PRO    Authorization Time Period 11/03/22-01/26/23    PT Start Time 1532    PT Stop Time 1612    PT Time Calculation (min) 40 min    Equipment Utilized During Treatment Gait belt    Activity Tolerance Patient tolerated treatment well    Behavior During Therapy WFL for tasks assessed/performed              Past Medical History:  Diagnosis Date   CVA (cerebral vascular accident) (HCC)    Hypertension    Past Surgical History:  Procedure Laterality Date   TOTAL HIP ARTHROPLASTY Right    Patient Active Problem List   Diagnosis Date Noted   Sepsis due to pneumonia (HCC) 01/09/2023   Severe sepsis (HCC) 01/09/2023   UTI (urinary tract infection) 01/09/2023   Stroke (HCC) 01/09/2023   HTN (hypertension) 01/09/2023   HLD (hyperlipidemia) 01/09/2023   Hypokalemia 01/09/2023   Tobacco use 01/09/2023   Obesity (BMI 30-39.9) 01/09/2023   CAP (community acquired pneumonia) 01/09/2023   Hypomagnesemia 01/09/2023   Hypophosphatemia 01/09/2023   TIA (transient ischemic attack) 01/05/2023   GERD (gastroesophageal reflux disease) 02/26/2021   S/P hip replacement, right 03/04/2020   Primary osteoarthritis of right hip 01/24/2020   Chronic pain of right knee 11/23/2019   Chronic right hip pain 11/23/2019   Osteoarthritis of knee 11/13/2018   History of adenomatous polyp of colon 02/14/2018   History of Barrett's esophagus 02/14/2018    ONSET DATE: January 2024  REFERRING DIAG: weakness due to stroke   THERAPY DIAG:  Muscle weakness (generalized)  Other lack of coordination  Visual  disturbance following cerebrovascular accident (CVA)  Difficulty in walking, not elsewhere classified  Unsteadiness on feet  Vision disturbance following CVA (cerebrovascular accident)  Rationale for Evaluation and Treatment: Rehabilitation  SUBJECTIVE:                                                                                                                                                                                             SUBJECTIVE STATEMENT:  Pt reports doing well today. No new updates since prior session.   Pt accompanied by: significant other  PERTINENT HISTORY:  Patient presents to physical therapy for weakness s/p stroke . 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 Per documentation he had a secondary stroke during the hospital stay as well as three seizures. Patient was admitted to [facility] Acute inpatient rehabilitation from 08/10/22-08/31/22 where he was discharged home. Patient was discharged from Home health on 10/28/22 (PT, OT, and ST). PMH includes GERD, OA, R knee/hip pain, Barrett's esophagus. Is using a walker, still drags right foot. Needs assistance for transfers but can bath and dress self.   PAIN:  Are you having pain? No  PRECAUTIONS: Fall  WEIGHT BEARING RESTRICTIONS: No  FALLS: Has patient fallen in last 6 months? No    PATIENT GOALS: walking with less AD, get back to mowing the yard, gardening, grilling     OBJECTIVE:   TODAY'S TREATMENT: DATE: 02/24/23  Nustep.   Pt performed forward step over bolster x 10 bil  Lateral step over bolster x 10 bil  Sit<>stand without UE support 2 x 8  Sit<>stand with UE raise over head x 10   Side stepping in rails 2 x 10 ft each direction with assist from PT to prevent pelvic rotation  Foot tap on 6 inch step x 10 bil with cues for full ROM on the RLE  Seated hip flexion with 3# ankle weight 2 x 10   Gait without AD 2 x 89ft +58ft to OT gym with supervision  assist fading to min assist due to decreased step length on the RLE causing foot drag.   Pt reports mild dizziness with sit<>stand towards end of session orthostatic VS assessed : Sitting 115/55 HR 72  Standing 119/65 HR 79 no s/s   Pt transported to OT. Left sitting in WC with OT present   PATIENT EDUCATION: Education details: goals, POC, HEP Pt educated throughout session about proper posture and technique with exercises. Improved exercise technique, movement at target joints, use of target muscles after min to mod verbal, visual, tactile cues.  Person educated: Patient and Spouse Education method: Explanation, Demonstration, Tactile cues, and Verbal cues Education comprehension: verbalized understanding, returned demonstration, verbal cues required, and tactile cues required  HOME EXERCISE PROGRAM:  Access Code: [email or web address] URL: [email or web address] Date: 12/23/2022 Prepared by: Grier Rocher  Exercises - Standing Tandem Balance with Counter Support  - 1 x daily - 5 x weekly - 3 sets - 4 reps - 10 hold - Standing Single Leg Stance with Counter Support  - 1 x daily - 5 x weekly - 3 sets - 4 reps - 10 hold  Access Code: [email or web address] URL: [email or web address] Date: 11/03/2022 Prepared by: Precious Bard  Exercises - Seated March  - 1 x daily - 7 x weekly - 2 sets - 10 reps - 5 hold - Seated Long Arc Quad  - 1 x daily - 7 x weekly - 2 sets - 10 reps - 5 hold - Seated Heel Toe Raises  - 1 x daily - 7 x weekly - 2 sets - 10 reps - 5 hold - Seated Hip Abduction  - 1 x daily - 7 x weekly - 2 sets - 10 reps - 5 hold - Seated Isometric Hip Adduction with Ball  - 1 x daily - 7 x weekly - 2 sets - 10 reps - 5 hold  GOALS: Goals reviewed with patient? Yes  SHORT TERM GOALS: Target date: 01/17/2023  Patient will be independent in home exercise program to improve strength/mobility for better functional independence  with ADLs. Baseline:4/24: HEP given  Goal status:  IN PROGRESS    LONG TERM GOALS: Target date: 01/26/2023  Patient will increase FOTO score to equal to or greater than  56 to demonstrate statistically significant improvement in mobility and quality of life.   Baseline: 40 6/3: 41 7/8:54 7/23: 52 Goal status: REVISED  2.  Patient (> 27 years old) will complete five times sit to stand test in < 15 seconds indicating an increased LE strength and improved balance. Baseline: 4/24: 38.37 with BUE support 6/3: 12.02 sec with UE support 7/8: 18.12 sec without UE assist 7/23: 16.93 without UE assist  Goal status: IN PROGRESS  3.  Patient will increase 10 meter walk test to >1.30m/s as to improve gait speed for better community ambulation and to reduce fall risk. Baseline: 4/24: 19.2 seconds with RW; dragging of R foot  6/3: 17.175 with no AD and CGA from PT.  7/8: 14.6 sec without AD (0.35m/s) 7/23: 13.21sec without AD (0.41m/s)   Goal status: IN PROGRESS  4.  Patient will increase Berg Balance score by > 45points to demonstrate decreased fall risk during functional activities. Baseline: 4/24: see next session 5/2: 30/56 6/3: 31/56 7/8 42/56  7/23:41/56  Goal status: REVISED In progress    5.  Patient will increase 2 min walk test  >300 ft as to demonstrate reduced fall risk and improved dynamic gait balance for better safety with community/home ambulation.   Baseline: 201 7/23: 245 with min assist due to heavy R foot drag for last 71ft  Goal status: REVISED in progress,      ASSESSMENT:  CLINICAL IMPRESSION:   Patient appeared ready for treatment on this day. PT treatment focused large amplituted movement for BUE/BLE with emphasis on the R side. Noted to have full ROM for all movements when force to attend to the R side, but limited when internally or exernally distracted. Gait without AD continues to require min assist with increased distance due to poor attention to the RLE with fatigue.  Pt will continue to benefit from  skilled therapy to address remaining deficits in order to improve overall QoL and return to PLOF.      OBJECTIVE IMPAIRMENTS: Abnormal gait, cardiopulmonary status limiting activity, decreased activity tolerance, decreased balance, decreased cognition, decreased coordination, decreased endurance, decreased knowledge of condition, decreased knowledge of use of DME, decreased mobility, difficulty walking, decreased strength, decreased safety awareness, impaired perceived functional ability, impaired flexibility, impaired UE functional use, impaired vision/preception, improper body mechanics, and postural dysfunction.   ACTIVITY LIMITATIONS: carrying, lifting, bending, sitting, standing, squatting, sleeping, stairs, transfers, bed mobility, bathing, toileting, reach over head, hygiene/grooming, locomotion level, and caring for others  PARTICIPATION LIMITATIONS: meal prep, cleaning, laundry, medication management, personal finances, interpersonal relationship, driving, shopping, community activity, and yard work  PERSONAL FACTORS: Age, Past/current experiences, Time since onset of injury/illness/exacerbation, Transportation, and 3+ comorbidities: GERD, OA, R knee/hip pain, Barrett's esophagus  are also affecting patient's functional outcome.   REHAB POTENTIAL: Good  CLINICAL DECISION MAKING: Evolving/moderate complexity  EVALUATION COMPLEXITY: Moderate  PLAN:  PT FREQUENCY: 2x/week  PT DURATION: 12 weeks  PLANNED INTERVENTIONS: Therapeutic exercises, Therapeutic activity, Neuromuscular re-education, Balance training, Gait training, Patient/Family education, Self Care, Joint mobilization, Joint manipulation, Stair training, Vestibular training, Canalith repositioning, Visual/preceptual remediation/compensation, Orthotic/Fit training, Electrical stimulation, Wheelchair mobility training, Spinal mobilization, Cryotherapy, Moist heat, Splintting, Taping, Ultrasound, Ionotophoresis 4mg /ml  Dexamethasone, Manual therapy, and Re-evaluation  PLAN FOR NEXT SESSION:   Endurance training BLE strengthening.  Attention  to the R side     Grier Rocher PT, DPT  Physical Therapist - [facility]  [facility]  4:11 PM 02/24/23

## 2023-03-01 ENCOUNTER — Encounter: Payer: BC Managed Care – PPO | Admitting: Speech Pathology

## 2023-03-01 ENCOUNTER — Ambulatory Visit: Payer: BC Managed Care – PPO

## 2023-03-01 ENCOUNTER — Ambulatory Visit: Payer: BC Managed Care – PPO | Admitting: Physical Therapy

## 2023-03-01 DIAGNOSIS — R2681 Unsteadiness on feet: Secondary | ICD-10-CM

## 2023-03-01 DIAGNOSIS — R278 Other lack of coordination: Secondary | ICD-10-CM

## 2023-03-01 DIAGNOSIS — M6281 Muscle weakness (generalized): Secondary | ICD-10-CM

## 2023-03-01 DIAGNOSIS — H539 Unspecified visual disturbance: Secondary | ICD-10-CM

## 2023-03-01 DIAGNOSIS — R262 Difficulty in walking, not elsewhere classified: Secondary | ICD-10-CM

## 2023-03-01 NOTE — Therapy (Signed)
OUTPATIENT PHYSICAL THERAPY NEURO TREATMENT     Patient Name: Mark Durham MRN: [id number] DOB:[birth year], 72 y.o., male Today's Date: 03/01/2023   PCP: [facility] REFERRING PROVIDER: Hope Pigeon MD   END OF SESSION:  PT End of Session - 03/01/23 1550     Visit Number 27    Number of Visits 41    Date for PT Re-Evaluation 04/11/23    Authorization Type [employer] COMM PRO    Authorization Time Period 11/03/22-01/26/23    PT Start Time 1535    PT Stop Time 1615    PT Time Calculation (min) 40 min    Equipment Utilized During Treatment Gait belt    Activity Tolerance Patient tolerated treatment well    Behavior During Therapy WFL for tasks assessed/performed              Past Medical History:  Diagnosis Date   CVA (cerebral vascular accident) (HCC)    Hypertension    Past Surgical History:  Procedure Laterality Date   TOTAL HIP ARTHROPLASTY Right    Patient Active Problem List   Diagnosis Date Noted   Sepsis due to pneumonia (HCC) 01/09/2023   Severe sepsis (HCC) 01/09/2023   UTI (urinary tract infection) 01/09/2023   Stroke (HCC) 01/09/2023   HTN (hypertension) 01/09/2023   HLD (hyperlipidemia) 01/09/2023   Hypokalemia 01/09/2023   Tobacco use 01/09/2023   Obesity (BMI 30-39.9) 01/09/2023   CAP (community acquired pneumonia) 01/09/2023   Hypomagnesemia 01/09/2023   Hypophosphatemia 01/09/2023   TIA (transient ischemic attack) 01/05/2023   GERD (gastroesophageal reflux disease) 02/26/2021   S/P hip replacement, right 03/04/2020   Primary osteoarthritis of right hip 01/24/2020   Chronic pain of right knee 11/23/2019   Chronic right hip pain 11/23/2019   Osteoarthritis of knee 11/13/2018   History of adenomatous polyp of colon 02/14/2018   History of Barrett's esophagus 02/14/2018    ONSET DATE: January 2024  REFERRING DIAG: weakness due to stroke   THERAPY DIAG:  Other lack of coordination  Muscle weakness  (generalized)  Difficulty in walking, not elsewhere classified  Unsteadiness on feet  Visual disturbance following cerebrovascular accident (CVA)  Vision disturbance following CVA (cerebrovascular accident)  Rationale for Evaluation and Treatment: Rehabilitation  SUBJECTIVE:                                                                                                                                                                                             SUBJECTIVE STATEMENT:  Pt reports doing well today. No new updates since prior session.   Pt accompanied by: significant other  PERTINENT HISTORY:  Patient presents to physical therapy for weakness s/p stroke . 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 Per documentation he had a secondary stroke during the hospital stay as well as three seizures. Patient was admitted to [facility] Acute inpatient rehabilitation from 08/10/22-08/31/22 where he was discharged home. Patient was discharged from Home health on 10/28/22 (PT, OT, and ST). PMH includes GERD, OA, R knee/hip pain, Barrett's esophagus. Is using a walker, still drags right foot. Needs assistance for transfers but can bath and dress self.   PAIN:  Are you having pain? No  PRECAUTIONS: Fall  WEIGHT BEARING RESTRICTIONS: No  FALLS: Has patient fallen in last 6 months? No    PATIENT GOALS: walking with less AD, get back to mowing the yard, gardening, grilling     OBJECTIVE:   TODAY'S TREATMENT: DATE: 03/01/23  PT applied gait belt for safety. CGA for safety througout session unless otherwise noted.   Nustep level 4 x 5 min with cues for full ROM on the RUE/RLE.   Sit<>stand x 10 with no UE support. Reports mild pain in the R knee at 8th repetition.   Gait with RW for 278ft, 3:56min. Pt able to complete 174 in 2 min.   Gait without AD 173ft for 2 min. Additional 57ft with min assist due decreased attention to the RLE when fatigued.   Instruction for increased step length on the RLE for both bouts of gait training.   Standing at rail on wall: Hip abduction x 12 AROM Hip flexion x 10 bil  Hip extension x 10 bil   Pt transported to OT. Left sitting in WC with OT present   PATIENT EDUCATION: Education details: goals, POC, HEP Pt educated throughout session about proper posture and technique with exercises. Improved exercise technique, movement at target joints, use of target muscles after min to mod verbal, visual, tactile cues.  Person educated: Patient and Spouse Education method: Explanation, Demonstration, Tactile cues, and Verbal cues Education comprehension: verbalized understanding, returned demonstration, verbal cues required, and tactile cues required  HOME EXERCISE PROGRAM:  Access Code: [email or web address] URL: [email or web address] Date: 12/23/2022 Prepared by: Grier Rocher  Exercises - Standing Tandem Balance with Counter Support  - 1 x daily - 5 x weekly - 3 sets - 4 reps - 10 hold - Standing Single Leg Stance with Counter Support  - 1 x daily - 5 x weekly - 3 sets - 4 reps - 10 hold  Access Code: [email or web address] URL: [email or web address] Date: 11/03/2022 Prepared by: Precious Bard  Exercises - Seated March  - 1 x daily - 7 x weekly - 2 sets - 10 reps - 5 hold - Seated Long Arc Quad  - 1 x daily - 7 x weekly - 2 sets - 10 reps - 5 hold - Seated Heel Toe Raises  - 1 x daily - 7 x weekly - 2 sets - 10 reps - 5 hold - Seated Hip Abduction  - 1 x daily - 7 x weekly - 2 sets - 10 reps - 5 hold - Seated Isometric Hip Adduction with Ball  - 1 x daily - 7 x weekly - 2 sets - 10 reps - 5 hold  GOALS: Goals reviewed with patient? Yes  SHORT TERM GOALS: Target date: 01/17/2023  Patient will be independent in home exercise program to improve strength/mobility for better functional independence with ADLs. Baseline:4/24: HEP given  Goal status: IN PROGRESS    LONG TERM GOALS: Target date:  01/26/2023  Patient will increase FOTO score to equal to or greater than  56 to demonstrate statistically significant improvement in mobility and quality of life.   Baseline: 40 6/3: 41 7/8:54 7/23: 52 Goal status: REVISED  2.  Patient (> 4 years old) will complete five times sit to stand test in < 15 seconds indicating an increased LE strength and improved balance. Baseline: 4/24: 38.37 with BUE support 6/3: 12.02 sec with UE support 7/8: 18.12 sec without UE assist 7/23: 16.93 without UE assist  Goal status: IN PROGRESS  3.  Patient will increase 10 meter walk test to >1.59m/s as to improve gait speed for better community ambulation and to reduce fall risk. Baseline: 4/24: 19.2 seconds with RW; dragging of R foot  6/3: 17.175 with no AD and CGA from PT.  7/8: 14.6 sec without AD (0.54m/s) 7/23: 13.21sec without AD (0.57m/s)   Goal status: IN PROGRESS  4.  Patient will increase Berg Balance score by > 45points to demonstrate decreased fall risk during functional activities. Baseline: 4/24: see next session 5/2: 30/56 6/3: 31/56 7/8 42/56  7/23:41/56  Goal status: REVISED In progress    5.  Patient will increase 2 min walk test  >300 ft as to demonstrate reduced fall risk and improved dynamic gait balance for better safety with community/home ambulation.   Baseline: 201 7/23: 245 with min assist due to heavy R foot drag for last 32ft  Goal status: REVISED in progress,      ASSESSMENT:  CLINICAL IMPRESSION:   Patient appeared ready for treatment on this day. Pt noted to have decreased attention to the RLE and decreased safety with long distance gait on this day. Pt reports that he was woken up from nap prior to session, so he is very tired this afternoon.  Pt will continue to benefit from skilled therapy to address remaining deficits in order to improve overall QoL and return to PLOF.      OBJECTIVE IMPAIRMENTS: Abnormal gait, cardiopulmonary status limiting activity,  decreased activity tolerance, decreased balance, decreased cognition, decreased coordination, decreased endurance, decreased knowledge of condition, decreased knowledge of use of DME, decreased mobility, difficulty walking, decreased strength, decreased safety awareness, impaired perceived functional ability, impaired flexibility, impaired UE functional use, impaired vision/preception, improper body mechanics, and postural dysfunction.   ACTIVITY LIMITATIONS: carrying, lifting, bending, sitting, standing, squatting, sleeping, stairs, transfers, bed mobility, bathing, toileting, reach over head, hygiene/grooming, locomotion level, and caring for others  PARTICIPATION LIMITATIONS: meal prep, cleaning, laundry, medication management, personal finances, interpersonal relationship, driving, shopping, community activity, and yard work  PERSONAL FACTORS: Age, Past/current experiences, Time since onset of injury/illness/exacerbation, Transportation, and 3+ comorbidities: GERD, OA, R knee/hip pain, Barrett's esophagus  are also affecting patient's functional outcome.   REHAB POTENTIAL: Good  CLINICAL DECISION MAKING: Evolving/moderate complexity  EVALUATION COMPLEXITY: Moderate  PLAN:  PT FREQUENCY: 2x/week  PT DURATION: 12 weeks  PLANNED INTERVENTIONS: Therapeutic exercises, Therapeutic activity, Neuromuscular re-education, Balance training, Gait training, Patient/Family education, Self Care, Joint mobilization, Joint manipulation, Stair training, Vestibular training, Canalith repositioning, Visual/preceptual remediation/compensation, Orthotic/Fit training, Electrical stimulation, Wheelchair mobility training, Spinal mobilization, Cryotherapy, Moist heat, Splintting, Taping, Ultrasound, Ionotophoresis 4mg /ml Dexamethasone, Manual therapy, and Re-evaluation  PLAN FOR NEXT SESSION:   Endurance training BLE strengthening.   Attention to the R side     Grier Rocher PT, DPT  Physical Therapist -  [facility]  [facility]  5:26 PM 03/01/23

## 2023-03-01 NOTE — Therapy (Signed)
OUTPATIENT OCCUPATIONAL THERAPY TREATMENT NOTE    Patient Name: Mark Durham MRN: [id number] DOB:1950-11-05, 72 y.o., male Today's Date: 03/01/2023  PCP: [facility] REFERRING PROVIDER: Hope Pigeon, MD  END OF SESSION:  OT End of Session - 03/01/23 1719     Visit Number 17    Number of Visits 24    Date for OT Re-Evaluation 04/11/23    Progress Note Due on Visit 10    OT Start Time 1615    OT Stop Time 1700    OT Time Calculation (min) 45 min    Equipment Utilized During Treatment transport chair    Activity Tolerance Patient tolerated treatment well    Behavior During Therapy WFL for tasks assessed/performed               Past Medical History:  Diagnosis Date   CVA (cerebral vascular accident) (HCC)    Hypertension    Past Surgical History:  Procedure Laterality Date   TOTAL HIP ARTHROPLASTY Right    Patient Active Problem List   Diagnosis Date Noted   Sepsis due to pneumonia (HCC) 01/09/2023   Severe sepsis (HCC) 01/09/2023   UTI (urinary tract infection) 01/09/2023   Stroke (HCC) 01/09/2023   HTN (hypertension) 01/09/2023   HLD (hyperlipidemia) 01/09/2023   Hypokalemia 01/09/2023   Tobacco use 01/09/2023   Obesity (BMI 30-39.9) 01/09/2023   CAP (community acquired pneumonia) 01/09/2023   Hypomagnesemia 01/09/2023   Hypophosphatemia 01/09/2023   TIA (transient ischemic attack) 01/05/2023   GERD (gastroesophageal reflux disease) 02/26/2021   S/P hip replacement, right 03/04/2020   Primary osteoarthritis of right hip 01/24/2020   Chronic pain of right knee 11/23/2019   Chronic right hip pain 11/23/2019   Osteoarthritis of knee 11/13/2018   History of adenomatous polyp of colon 02/14/2018   History of Barrett's esophagus 02/14/2018   ONSET DATE:  07/25/2022  REFERRING DIAG:  CVA  THERAPY DIAG:  Muscle weakness (generalized)  Other lack of coordination  Vision disturbance following CVA (cerebrovascular accident)  Rationale for  Evaluation and Treatment: Rehabilitation  SUBJECTIVE:  SUBJECTIVE STATEMENT: Pt/spouse in agreement to d/c OT next session to focus remaining therapy visits on ambulation with PT. Accompanied by: self  PERTINENT HISTORY:   02/01/2023 Addendum Pt. Had recent ED visit from OT on 01/27/2023 due to concerns for high blood pressure and headache. Initially was associated with blurry vision although he resolved at ED. CT scan without concerning abnormality and Pt. Was given medications and discharged.    01/17/2023 Addendum:  Pt. had 2 recent ED visits with hospitalizations. On 01/05/23 Pt. was hospitalized due sudden headache, slurred speech, and right sided facial drop. CT scan of the head showed no acute intracranial abnormality however shoes chronic small vessel ischemic disease. CT angio of the head did not show any LVO or other emergent finding. 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 MRI resulted in no acute finding however showed "Asymmetric T2/FLAIR signal intensity involving the right transverse and sigmoid sinuses as well as the right jugular bulb". Pt. was hospitalized again 01/09/2023 due to severe sepsis and was discharged 01/12/2023.  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 Per chart review, Pt. had a secondary stroke during the hospital stay as well as three seizures. Patient was admitted to [facility] Acute inpatient rehabilitation from 08/10/22-08/31/22 where he was discharged home. Patient  was discharged from Home health on 10/28/22 (PT, OT, and ST). PMH includes: GERD, OA, R knee/hip pain, Barrett's esophagus. Pt. Had recent ED visit on 01/05/23 due sudden headache, slurred speech, and right sided facial drop. CT scan of the head showed no acute intracranial abnormality however shoes chronic small vessel ischemic disease. CT  angio of the head did not show any LVO or other emergent finding. 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 MRI resulted in no acute finding however showed "Asymmetric T2/FLAIR signal intensity involving the right transverse and sigmoid sinuses as well as the right jugular bulb". Pt. Was hospitalized 01/09/2023 due to severe sepsis and was discharged 01/12/2023.  PRECAUTIONS: None  WEIGHT BEARING RESTRICTIONS: No  PAIN:  Are you having pain? No  FALLS: Has patient fallen in last 6 months? No  LIVING ENVIRONMENT: Lives with: lives with their spouse Lives in: House/apartment Stairs: 5 steps to enter; one level home Has following equipment at home: Environmental consultant - 2 wheeled, Wheelchair (manual), shower chair, and bed side commode  PLOF: Independent  PATIENT GOALS: To get to walking  OBJECTIVE:   HAND DOMINANCE: Right  ADLs: Transfers/ambulation related to ADLs: Eating: Independent with utensils, assist with cutting food.  Grooming: Independent oral care, wife assists with shaving. UB Dressing: Independent LB Dressing: independent Toileting: Independent Bathing: Once in the shower Supervision with bathing Tub Shower transfers: CGA with showere chair  IADLs: Shopping: Wife performs Light housekeeping: Wife performs most IADLs Meal Prep:  Meal preparation Community mobility: Relies on family and friends for driving. Medication management: Wife sets up, and provides medication Financial management: Wife completes Handwriting:  Pt. Wife reports Difficulty with identifying letters, and numbers   MOBILITY STATUS: Uses a rolling walker  POSTURE COMMENTS:  No Significant postural limitations  ACTIVITY TOLERANCE: Activity tolerance:  Fair  FUNCTIONAL OUTCOME MEASURES: FOTO: 53  TR score: 55 01/17/23: FOTO: 61 TR score: 55  UPPER EXTREMITY ROM:    Active ROM  Right Eval [facility] Left Eval [facility] Right 01/17/2023 [facility] R 01/17/2023 [facility]  Shoulder flexion      Shoulder abduction      Shoulder adduction      Shoulder extension      Shoulder internal rotation      Shoulder external rotation      Elbow flexion      Elbow extension      Wrist flexion      Wrist extension      Wrist ulnar deviation      Wrist radial deviation      Wrist pronation      Wrist supination      (Blank rows = not tested)  UPPER EXTREMITY MMT:     MMT Right Eval 4/5 Left Eval 4+/5 Right 01/17/23 Left 01/17/23  Shoulder flexion 4-/5 4+/5 4+/5 4+/5  Shoulder abduction  l 4/5 4+/5  Shoulder adduction      Shoulder extension      Shoulder internal rotation      Shoulder external rotation      Middle trapezius      Lower trapezius      Elbow flexion 4+/5 5/5 4+/5 5/5  Elbow extension 4+/5 5/5 4+/5 5/5  Wrist flexion      Wrist extension 4/5 4+/5 4+/5 4+/5  Wrist ulnar deviation      Wrist radial deviation      Wrist pronation  Wrist supination      (Blank rows = not tested)  HAND FUNCTION: Grip strength: Right: 35 lbs; Left: 25 lbs, Lateral pinch: Right: 13 lbs, Left: 11 lbs, and 3 point pinch: Right: 11 lbs, Left: Pt. Unable to assume 3pt. Pinch position 01/17/2023: Grip strength: Right: 45 lbs; Left: 42 lbs, Lateral pinch: Right: 23 lbs, Left: 22 lbs, and 3 point pinch: Right: 18 lbs, Left: 18  COORDINATION: 9 Hole Peg test: Right: placement/removal of 5 pegs in 1 min. & 5 sec; Left: 50 sec Pt. Has difficulty seeing items in the dish on the right side. Pt. Kept moving the vertical pegs between holes with the right 01/17/23: 9 Hole Peg test: Right:1 minute 6 seconds Left: 41 sec Pt. Has difficulty seeing items in the dish on the right side.  SENSATION: Light touch: WFL Proprioception: Impaired   EDEMA: Intact   MUSCLE TONE: RUE: Mild flexor tone. Wife reports right hand presents with flexion in the right hand  COGNITION: Overall cognitive status:  Pt.'s  wife reports noticing the Pt. has difficulty with numbers, and letters.  VISION:    Wife reports the Pt. frequently misses items on the right, and bumping into obstacle on the right   VISION ASSESSMENT: To be further assessed in functional context  11/24/22: impaired smooth pursuits all quadrants, requiring additional head turns; impaired saccades (slow pace, requiring head turns).  Visual fields appear grossly intact, but difficult to assess d/t decreased attention and ability to consistently follow a 2 step command. Moderate-severe R sided inattention noted.  Pt. Has a neuro optometrist appointment scheduled for June.   PERCEPTION:  TBD  PRAXIS: Impaired: Motor planning  TODAY'S TREATMENT:                                                                                                                              DATE: 03/01/2023  Self Care: Reviewed self care routines in made recommendations for this week to prep for spouse's return to work next week.  Recommendation to leave morning meds in a designated cup or keep present day flipped open in pill organizer.  Encouraged spouse leave main phone numbers pt may need to call for assistance when spouse is at [facility] numbers on white board on fridge or coffee table/kitchen table.  Encouraged pt practice using his cell phone to make these calls to primary contacts.  Pt to practice reheating breakfast food.  Pt currently uses the popcorn button to pop his popcorn, but will need to use "quick cook" buttons to reheat other foods.   Practiced handwriting skills: printed first and last name x5 reps, signed first and last name x5 reps.  Increased time, but improving.  Advised pt/spouse to continue practice at home with printing, signing, writing numbers for dates, allow high reps, increased time, visuals to copy numbers as needed. Spouse inquired about whether pt may use a transfer tub bench, as he currently uses a shower chair but  spouse has to cue pt  on safe sequencing while stepping over tub.  OT advised tub transfer may allow for less cueing and increased safety and advised on options to obtain. Pt spoke again about wanting to get back to driving.  OT discussed risks with pt/spouse with pt's cognitive limitations and visual deficits, and advised pt to hold off on a driver's assessment as long as these limitations are still present.  Spouse agreed/verbalized understanding, but pt continues to have limited awareness into his deficits and the risks that are involved with driving with his limitations.  PATIENT EDUCATION: Education details: ADL safety Person educated: Patient/spouse Education method: explanation, vc Education comprehension: pt/spouse verbalized understanding  HOME EXERCISE PROGRAM: [place] theraputty; recommended solitaire for increasing R sided visual attention, handwriting practice  GOALS: Goals reviewed with patient?  Yes   SHORT TERM GOALS: Target date: 02/28/23  Pt. Will require Supervision for bilateral UE HEPs.  Baseline: Eval: No current HEP; 01/19/23: pt completes theraputty exercises for R hand strength/coordination; pt/spouse have been provided with activities to increase attention to R visual field, and handwriting activities.  Goal status: Ongoing  LONG TERM GOALS: Target date: 04/11/2023    Pt. Will increase FOTO score by 2 points for Pt. perceived improvement with assessment specific ADLs, and IADLs  Baseline: Eval:  FOTO score: 53 TR score: 55 01/17/2023: FOTO score: 61 TR: 55 Goal status: Met/Achieved  2.  Pt. Will increase bilateral UE strength by 2 mm grades overall to assist with ADLs, and IADLs Baseline: Eval: Right:Shoulder flexion 4/5, abduction 4-/5, elbow flexion 4+/5, extension 4+/5, wrist extension 4/5 Left Shoulder flexion 4+/5, abduction 4+/5, elbow flexion 5/5, extension 5/5, wrist extension: 4+/5 01/17/2023: Right:Shoulder flexion 4+/5, abduction 4/5, elbow flexion 4+/5, extension 4+/5,  wrist extension 4+/5 Left Shoulder flexion 4+/5, abduction 4+/5, elbow flexion 5/5, extension 5/5, wrist extension: 4+/5 Goal status: Ongoing  3.  Pt. Will improve bilateral hand [facility] skills by 10 sec. In order to be able to manipulate objects during ADLs, and IADLs Baseline: Eval: Right: Pt. Placed/removed 5 pegs in 1 min, & 5 sec. Pt. Kept moving the vertical pegs between holes with the right.  Left: 50 sec; 01/17/2023: 9 Hole Peg test: Right:1 minute 6 seconds Left: 41 sec  Goal status: Ongoing  4.  Pt will consistently implement visual compensatory strategies for ADLs, and IADLs Baseline: Eval: Pt. Currently does not utilize. Education to be provided. 01/17/2023: Education provided, continue to implement Goal status: Ongoing  5. Pt. Will improve bilateral grip strength to be able to securely hold items for ADLs, and IADLs. Baseline Eval: Right: 35 lbs; Left: 25 lbs; 01/17/2023: Right: 45 lbs; Left: 42 lbs, Goal status: Ongoing  6. Pt. Will improve bilateral lateral pinch strength to be able to securely hold items for ADLs, and IADLs. Baseline: Right: 13# , Left: 11#; 01/17/23: Right: 23# , Left: 22# Goal status: Ongoing  7. Pt will print first and last name with min vc or less to be able to sign a greeting card. Baseline: 01/19/23: Able to print first name with extra time, and was able to print last name only when copying it, otherwise pt could only construct 4 of the 7 letters in his last name, and letters were not in the correct sequence. Goal status: New  ASSESSMENT:  CLINICAL IMPRESSION: Goals reviewed with pt and spouse today and discussed plan moving forward with remaining OT visits.  Pt/spouse in agreement to plan d/c next session in order to focus remaining sessions  on ambulation with PT, as pt is reaching max rehab potential with OT goals and has sufficiently met strength and coordination goals for BUEs.  ADL safety reviewed today; see details in note.  Pt continues to have decreased  insight into his deficits, though spouse is aware and understands the appropriate level of supervision needed to maintain safety in the home.  Spouse reports that she will be returning to work next week and pt will be home in the mornings until early afternoon.  OT reviewed ADL routines, safety, and routines to practice this week before spouse returns to work next week, including pt to practice making phone calls to family members and reheating breakfast foods.  Pt/spouse receptive to all education and plan for OT d/c next visit.    PERFORMANCE DEFICITS: in functional skills including ADLs, IADLs, coordination, dexterity, ROM, strength, Fine motor control, Gross motor control, decreased knowledge of use of DME, and UE functional use, cognitive skills including, and psychosocial skills including.   IMPAIRMENTS: are limiting patient from ADLs, IADLs, play, and leisure.   CO-MORBIDITIES: may have co-morbidities  that affects occupational performance. Patient will benefit from skilled OT to address above impairments and improve overall function.  MODIFICATION OR ASSISTANCE TO COMPLETE EVALUATION: Min-Moderate modification of tasks or assist with assess necessary to complete an evaluation.  OT OCCUPATIONAL PROFILE AND HISTORY: Detailed assessment: Review of records and additional review of physical, cognitive, psychosocial history related to current functional performance.  CLINICAL DECISION MAKING: Moderate - several treatment options, min-mod task modification necessary  REHAB POTENTIAL: Good  EVALUATION COMPLEXITY: Moderate  PLAN:  OT FREQUENCY: 2x/week  OT DURATION: 12 weeks  PLANNED INTERVENTIONS: self care/ADL training, therapeutic exercise, therapeutic activity, neuromuscular re-education, manual therapy, and functional mobility training  RECOMMENDED OTHER SERVICES:  OT and ST  CONSULTED AND AGREED WITH PLAN OF CARE: Patient  PLAN FOR NEXT SESSION: HEP  Danelle Earthly, MS,  OTR/L

## 2023-03-03 ENCOUNTER — Encounter: Payer: BC Managed Care – PPO | Admitting: Speech Pathology

## 2023-03-03 ENCOUNTER — Ambulatory Visit: Payer: BC Managed Care – PPO

## 2023-03-03 ENCOUNTER — Ambulatory Visit: Payer: BC Managed Care – PPO | Admitting: Physical Therapy

## 2023-03-03 DIAGNOSIS — R278 Other lack of coordination: Secondary | ICD-10-CM

## 2023-03-03 DIAGNOSIS — H539 Unspecified visual disturbance: Secondary | ICD-10-CM

## 2023-03-03 DIAGNOSIS — R262 Difficulty in walking, not elsewhere classified: Secondary | ICD-10-CM

## 2023-03-03 DIAGNOSIS — M6281 Muscle weakness (generalized): Secondary | ICD-10-CM

## 2023-03-03 DIAGNOSIS — R2681 Unsteadiness on feet: Secondary | ICD-10-CM

## 2023-03-03 NOTE — Therapy (Signed)
OUTPATIENT OCCUPATIONAL THERAPY DISCHARGE NOTE    Patient Name: Mark Durham MRN: [id number] DOB:1951/06/09, 72 y.o., male Today's Date: 03/06/2023  PCP: [facility] REFERRING PROVIDER: Hope Pigeon, MD  END OF SESSION:   OT End of Session - 03/03/23       Visit Number 18    Number of Visits 24     Date for OT Re-Evaluation 04/11/23     Progress Note Due on Visit 10     OT Start Time 1615     OT Stop Time 0445p     OT Time Calculation (min) 30 min     Equipment Utilized During Treatment transport chair     Activity Tolerance Patient tolerated treatment well     Behavior During Therapy WFL for tasks assessed/performed              Past Medical History:  Diagnosis Date   CVA (cerebral vascular accident) (HCC)    Hypertension    Past Surgical History:  Procedure Laterality Date   TOTAL HIP ARTHROPLASTY Right    Patient Active Problem List   Diagnosis Date Noted   Sepsis due to pneumonia (HCC) 01/09/2023   Severe sepsis (HCC) 01/09/2023   UTI (urinary tract infection) 01/09/2023   Stroke (HCC) 01/09/2023   HTN (hypertension) 01/09/2023   HLD (hyperlipidemia) 01/09/2023   Hypokalemia 01/09/2023   Tobacco use 01/09/2023   Obesity (BMI 30-39.9) 01/09/2023   CAP (community acquired pneumonia) 01/09/2023   Hypomagnesemia 01/09/2023   Hypophosphatemia 01/09/2023   TIA (transient ischemic attack) 01/05/2023   GERD (gastroesophageal reflux disease) 02/26/2021   S/P hip replacement, right 03/04/2020   Primary osteoarthritis of right hip 01/24/2020   Chronic pain of right knee 11/23/2019   Chronic right hip pain 11/23/2019   Osteoarthritis of knee 11/13/2018   History of adenomatous polyp of colon 02/14/2018   History of Barrett's esophagus 02/14/2018   ONSET DATE:  07/25/2022  REFERRING DIAG:  CVA  THERAPY DIAG:  Muscle weakness (generalized)  Other lack of coordination  Vision disturbance following CVA (cerebrovascular  accident)  Rationale for Evaluation and Treatment: Rehabilitation  SUBJECTIVE:  SUBJECTIVE STATEMENT: Pt reports doing well today and remains in agreement with plan for OT d/c this date. Accompanied by: self  PERTINENT HISTORY:   02/01/2023 Addendum Pt. Had recent ED visit from OT on 01/27/2023 due to concerns for high blood pressure and headache. Initially was associated with blurry vision although he resolved at ED. CT scan without concerning abnormality and Pt. Was given medications and discharged.    01/17/2023 Addendum:  Pt. had 2 recent ED visits with hospitalizations. On 01/05/23 Pt. was hospitalized due sudden headache, slurred speech, and right sided facial drop. CT scan of the head showed no acute intracranial abnormality however shoes chronic small vessel ischemic disease. CT angio of the head did not show any LVO or other emergent finding. 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 MRI resulted in no acute finding however showed "Asymmetric T2/FLAIR signal intensity involving the right transverse and sigmoid sinuses as well as the right jugular bulb". Pt. was hospitalized again 01/09/2023 due to severe sepsis and was discharged 01/12/2023.  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 Per chart review, Pt. had a secondary stroke during the hospital stay as well as three seizures. Patient was admitted to [facility] Acute inpatient rehabilitation  from 08/10/22-08/31/22 where he was discharged home. Patient was discharged from Home health on 10/28/22 (PT, OT, and ST). PMH includes: GERD, OA, R knee/hip pain, Barrett's esophagus. Pt. Had recent ED visit on 01/05/23 due sudden headache, slurred speech, and right sided facial drop. CT scan of the head showed no acute intracranial abnormality however shoes chronic small vessel ischemic  disease. CT angio of the head did not show any LVO or other emergent finding. 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 MRI resulted in no acute finding however showed "Asymmetric T2/FLAIR signal intensity involving the right transverse and sigmoid sinuses as well as the right jugular bulb". Pt. Was hospitalized 01/09/2023 due to severe sepsis and was discharged 01/12/2023.  PRECAUTIONS: None  WEIGHT BEARING RESTRICTIONS: No  PAIN:  Are you having pain? No  FALLS: Has patient fallen in last 6 months? No  LIVING ENVIRONMENT: Lives with: lives with their spouse Lives in: House/apartment Stairs: 5 steps to enter; one level home Has following equipment at home: Environmental consultant - 2 wheeled, Wheelchair (manual), shower chair, and bed side commode  PLOF: Independent  PATIENT GOALS: To get to walking  OBJECTIVE:   HAND DOMINANCE: Right  ADLs: Transfers/ambulation related to ADLs: Eating: Independent with utensils, assist with cutting food.  Grooming: Independent oral care, wife assists with shaving. UB Dressing: Independent LB Dressing: independent Toileting: Independent Bathing: Once in the shower Supervision with bathing Tub Shower transfers: CGA with showere chair  IADLs: Shopping: Wife performs Light housekeeping: Wife performs most IADLs Meal Prep:  Meal preparation Community mobility: Relies on family and friends for driving. Medication management: Wife sets up, and provides medication Financial management: Wife completes Handwriting:  Pt. Wife reports Difficulty with identifying letters, and numbers   MOBILITY STATUS: Uses a rolling walker  POSTURE COMMENTS:  No Significant postural limitations  ACTIVITY TOLERANCE: Activity tolerance:  Fair  FUNCTIONAL OUTCOME MEASURES: FOTO: 53  TR score: 55 01/17/23: FOTO: 61 TR score: 55 03/03/23: FOTO: 78  UPPER  EXTREMITY ROM:    Active ROM Right Eval [facility] Left Eval [facility] Right 01/17/2023 [facility] R 01/17/2023 [facility]  Shoulder flexion      Shoulder abduction      Shoulder adduction      Shoulder extension      Shoulder internal rotation      Shoulder external rotation      Elbow flexion      Elbow extension      Wrist flexion      Wrist extension      Wrist ulnar deviation      Wrist radial deviation      Wrist pronation      Wrist supination      (Blank rows = not tested)  UPPER EXTREMITY MMT:     MMT Right Eval  Left Eval  Right 01/17/23 Left 01/17/23 Right 03/03/23  Shoulder flexion 4/5 4+/5 4+/5 4+/5 4+/5  Shoulder abduction 4- 4+/5 4/5 4+/5 4+/5  Shoulder adduction       Shoulder extension       Shoulder internal rotation       Shoulder external rotation       Middle trapezius       Lower trapezius       Elbow flexion 4+/5 5/5 4+/5 5/5 5/5  Elbow extension 4+/5 5/5 4+/5 5/5 5/5  Wrist flexion     4+/5  Wrist extension 4/5  4+/5 4+/5 4+/5 4+/5  Wrist ulnar deviation       Wrist radial deviation       Wrist pronation       Wrist supination       (Blank rows = not tested)  HAND FUNCTION: Grip strength: Right: 35 lbs; Left: 25 lbs, Lateral pinch: Right: 13 lbs, Left: 11 lbs, and 3 point pinch: Right: 11 lbs, Left: Pt. Unable to assume 3pt. Pinch position 01/17/2023: Grip strength: Right: 45 lbs; Left: 42 lbs, Lateral pinch: Right: 23 lbs, Left: 22 lbs, and 3 point pinch: Right: 18 lbs, Left: 18 03/03/2023: Grip strength: Right: 46 lbs; Left: 52 lbs, Lateral pinch: Right: 24 lbs, Left: 25 lbs, and 3 point pinch: Right: 16  lbs, Left: 16   COORDINATION: 9 Hole Peg test: Right: placement/removal of 5 pegs in 1 min. & 5 sec; Left: 50 sec Pt. Has difficulty seeing items in the dish on the right side. Pt. Kept moving the vertical pegs between holes with the right 01/17/23: 9 Hole Peg test: Right:1 minute 6 seconds Left: 41 sec Pt. Has difficulty seeing items in the dish on the right  side. 03/03/23: 9 Hole Peg test: Right: 53 sec, Left: 37 sec; Pt. Has difficulty seeing items in the dish on the right side.  SENSATION: Light touch: WFL Proprioception: Impaired   EDEMA: Intact   MUSCLE TONE: RUE: Mild flexor tone. Wife reports right hand presents with flexion in the right hand  COGNITION: Overall cognitive status:  Pt.'s wife reports noticing the Pt. has difficulty with numbers, and letters.  VISION:    Wife reports the Pt. frequently misses items on the right, and bumping into obstacle on the right   VISION ASSESSMENT: To be further assessed in functional context  11/24/22: impaired smooth pursuits all quadrants, requiring additional head turns; impaired saccades (slow pace, requiring head turns).  Visual fields appear grossly intact, but difficult to assess d/t decreased attention and ability to consistently follow a 2 step command. Moderate-severe R sided inattention noted.  Pt. Has a neuro optometrist appointment scheduled for June.   PERCEPTION:  TBD  PRAXIS: Impaired: Motor planning  TODAY'S TREATMENT:                                                                                                                              DATE: 03/03/2023  Therapeutic Exercise: Objective measures taken and goals updated for discharge assessment.  Self Care: Reviewed self care routines and specific recommendations for this week to prep for spouse's return to work next week.  Spouse reports she had to be to work this morning for a little bit and she left pt's pills in a cup.  They will plan to practice having pt make an outgoing call to spouse's phone and their close friend who will be another primary contact when spouse is at work.  Pt has practiced heating breakfast in the microwave without difficulty.   Practiced  handwriting skills: printed first and last name x5 reps, signed first and last name x5 reps.  Increased time, but improving.  Advised pt/spouse to continue  practice at home with printing, signing, writing numbers for dates, allow high reps, increased time, visuals to copy numbers as needed.   PATIENT EDUCATION: Education details: ADL safety/progress towards goals/HEP Person educated: Patient/spouse Education method: explanation, vc Education comprehension: pt/spouse verbalized understanding  HOME EXERCISE PROGRAM: [place] theraputty; recommended solitaire for increasing R sided visual attention, handwriting practice  GOALS: Goals reviewed with patient?  Yes   SHORT TERM GOALS: Target date: 02/28/23  Pt. Will require Supervision for bilateral UE HEPs.  Baseline: Eval: No current HEP; 01/19/23: pt completes theraputty exercises for R hand strength/coordination; pt/spouse have been provided with activities to increase attention to R visual field, and handwriting activities; 03/03/23: Spouse is indep with handwriting HEP (spouse initiates for pt); pt indep with theraputty exercises Goal status: Achieved  LONG TERM GOALS: Target date: 04/11/2023    Pt. Will increase FOTO score by 2 points for Pt. perceived improvement with assessment specific ADLs, and IADLs  Baseline: Eval:  FOTO score: 53 TR score: 55 01/17/2023: FOTO score: 61 TR: 55; 03/03/23: 78 Goal status: Achieved  2.  Pt. Will increase bilateral UE strength by 2 mm grades overall to assist with ADLs, and IADLs Baseline: Eval: Right:Shoulder flexion 4/5, abduction 4-/5, elbow flexion 4+/5, extension 4+/5, wrist extension 4/5 Left Shoulder flexion 4+/5, abduction 4+/5, elbow flexion 5/5, extension 5/5, wrist extension: 4+/5 01/17/2023: Right:Shoulder flexion 4+/5, abduction 4/5, elbow flexion 4+/5, extension 4+/5, wrist extension 4+/5 Left Shoulder flexion 4+/5, abduction 4+/5, elbow flexion 5/5, extension 5/5, wrist extension: 4+/5; 03/03/23: Bilat shoulder/elbow/wrists equal (See chart) Goal status: Achieved  3.  Pt. Will improve bilateral hand [facility] skills by 10 sec. In order to be able  to manipulate objects during ADLs, and IADLs Baseline: Eval: Right: Pt. Placed/removed 5 pegs in 1 min, & 5 sec. Pt. Kept moving the vertical pegs between holes with the right.  Left: 50 sec; 01/17/2023: 9 Hole Peg test: Right:1 minute 6 seconds Left: 41 sec ; 03/03/23: 9 hole peg test: Right: 53 sec, Left: 37 sec Goal status: Achieved  4.  Pt will consistently implement visual compensatory strategies for ADLs, and IADLs Baseline: Eval: Pt. Currently does not utilize. Education to be provided. 01/17/2023: Education provided, continue to implement; 03/03/23: Pt consistently demonstrates head turns to the R to navigate environment Goal status: Achieved  5. Pt. Will improve bilateral grip strength to be able to securely hold items for ADLs, and IADLs. Baseline Eval: Right: 35 lbs; Left: 25 lbs; 01/17/2023: Right: 45 lbs; Left: 42 lbs; 03/03/23: Right: 46 lbs, Left: 52 lbs Goal status: Achieved  6. Pt. Will improve bilateral lateral pinch strength to be able to securely hold items for ADLs, and IADLs. Baseline: Right: 13# , Left: 11#; 01/17/23: Right: 23# , Left: 22#; 03/03/23: Right: 24 lbs, Left: 25 lbs  Goal status: Achieved  7. Pt will print first and last name with min vc or less to be able to sign a greeting card. Baseline: 01/19/23: Able to print first name with extra time, and was able to print last name only when copying it, otherwise pt could only construct 4 of the 7 letters in his last name, and letters were not in the correct sequence; 03/03/23: Pt can print first/last name with extra time and min vc. Goal status: Achieved  ASSESSMENT:  CLINICAL IMPRESSION: Pt seen for discharge assessment  this date.  Pt has completed 18 OT sessions to address functional decline following CVA, with deficits including muscle weakness, lack of coordination, and visual disturbance.  FOTO score has improved from 53 at eval to 78 at discharge.  BUE strength is grossly equal at the shoulders, elbows, wrists, and hands.   Cognitive decline still present, though spouse provides appropriate level of supv for ADL safety.  Pt is consistently demonstrating head turns to navigate environment and search for items in R visual field.  All OT goals met at this time.  No further skilled OT indicated at this time.    PERFORMANCE DEFICITS: in functional skills including ADLs, IADLs, coordination, dexterity, ROM, strength, Fine motor control, Gross motor control, decreased knowledge of use of DME, and UE functional use, cognitive skills including, and psychosocial skills including.   IMPAIRMENTS: are limiting patient from ADLs, IADLs, play, and leisure.   CO-MORBIDITIES: may have co-morbidities  that affects occupational performance. Patient will benefit from skilled OT to address above impairments and improve overall function.  MODIFICATION OR ASSISTANCE TO COMPLETE EVALUATION: Min-Moderate modification of tasks or assist with assess necessary to complete an evaluation.  OT OCCUPATIONAL PROFILE AND HISTORY: Detailed assessment: Review of records and additional review of physical, cognitive, psychosocial history related to current functional performance.  CLINICAL DECISION MAKING: Moderate - several treatment options, min-mod task modification necessary  REHAB POTENTIAL: Good  EVALUATION COMPLEXITY: Moderate  PLAN:  OT FREQUENCY: 2x/week  OT DURATION: 12 weeks  PLANNED INTERVENTIONS: self care/ADL training, therapeutic exercise, therapeutic activity, neuromuscular re-education, manual therapy, and functional mobility training  RECOMMENDED OTHER SERVICES:  OT and ST  CONSULTED AND AGREED WITH PLAN OF CARE: Patient  PLAN FOR NEXT SESSION: N/A; d/c this date  Danelle Earthly, MS, OTR/L

## 2023-03-03 NOTE — Therapy (Signed)
OUTPATIENT PHYSICAL THERAPY NEURO TREATMENT     Patient Name: Mark Durham MRN: [id number] DOB:10/09/1950, 72 y.o., male Today's Date: 03/03/2023   PCP: [facility] REFERRING PROVIDER: Hope Pigeon MD   END OF SESSION:  PT End of Session - 03/03/23 1720     Visit Number 28    Number of Visits 41    Date for PT Re-Evaluation 04/11/23    Authorization Type BCBS COMM PRO    Authorization Time Period 11/03/22-01/26/23    PT Start Time 1531    PT Stop Time 1615    PT Time Calculation (min) 44 min    Equipment Utilized During Treatment Gait belt    Activity Tolerance Patient tolerated treatment well    Behavior During Therapy WFL for tasks assessed/performed               Past Medical History:  Diagnosis Date   CVA (cerebral vascular accident) (HCC)    Hypertension    Past Surgical History:  Procedure Laterality Date   TOTAL HIP ARTHROPLASTY Right    Patient Active Problem List   Diagnosis Date Noted   Sepsis due to pneumonia (HCC) 01/09/2023   Severe sepsis (HCC) 01/09/2023   UTI (urinary tract infection) 01/09/2023   Stroke (HCC) 01/09/2023   HTN (hypertension) 01/09/2023   HLD (hyperlipidemia) 01/09/2023   Hypokalemia 01/09/2023   Tobacco use 01/09/2023   Obesity (BMI 30-39.9) 01/09/2023   CAP (community acquired pneumonia) 01/09/2023   Hypomagnesemia 01/09/2023   Hypophosphatemia 01/09/2023   TIA (transient ischemic attack) 01/05/2023   GERD (gastroesophageal reflux disease) 02/26/2021   S/P hip replacement, right 03/04/2020   Primary osteoarthritis of right hip 01/24/2020   Chronic pain of right knee 11/23/2019   Chronic right hip pain 11/23/2019   Osteoarthritis of knee 11/13/2018   History of adenomatous polyp of colon 02/14/2018   History of Barrett's esophagus 02/14/2018    ONSET DATE: January 2024  REFERRING DIAG: weakness due to stroke   THERAPY DIAG:  Muscle weakness (generalized)  Other lack of coordination  Vision  disturbance following CVA (cerebrovascular accident)  Difficulty in walking, not elsewhere classified  Unsteadiness on feet  Visual disturbance following cerebrovascular accident (CVA)  Rationale for Evaluation and Treatment: Rehabilitation  SUBJECTIVE:                                                                                                                                                                                             SUBJECTIVE STATEMENT:  Pt reports doing well today. No new updates since prior session.   Pt accompanied by: significant other  PERTINENT  HISTORY: Patient presents to physical therapy for weakness s/p stroke . 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 Per documentation he had a secondary stroke during the hospital stay as well as three seizures. Patient was admitted to [facility] Acute inpatient rehabilitation from 08/10/22-08/31/22 where he was discharged home. Patient was discharged from Home health on 10/28/22 (PT, OT, and ST). PMH includes GERD, OA, R knee/hip pain, Barrett's esophagus. Is using a walker, still drags right foot. Needs assistance for transfers but can bath and dress self.   PAIN:  Are you having pain? No  PRECAUTIONS: Fall  WEIGHT BEARING RESTRICTIONS: No  FALLS: Has patient fallen in last 6 months? No    PATIENT GOALS: walking with less AD, get back to mowing the yard, gardening, grilling     OBJECTIVE:   TODAY'S TREATMENT: DATE: 03/03/23  PT applied gait belt for safety. CGA for safety througout session unless otherwise noted.   Nustep BLE only level 4 x 5 min noted to have improved ROM with use of BL Eonly.   Gait training through rehab gym x 19ft, 79ft, 136ft, and 19ft; UE supported on RW and min cues for fatigue for improved posture and step length with the RLE as well as improved visual scanning to the R to avoid obstacles.   In parallel bars:  Stepping over bolster with BUE  support x 10 BLE. Additional 10 BLE with only rUE support on rail.  Lateral stepping over bolster x 10 bil with BUE support   Stair management training 2 x 4 with short standing rest break between bout. Moderate multimodal cues for attention to the RUE to reduce fall risk and improve safety with descent>ascent.    Pt transported to OT. Left sitting in WC with OT present   PATIENT EDUCATION: Education details: goals, POC, HEP Pt educated throughout session about proper posture and technique with exercises. Improved exercise technique, movement at target joints, use of target muscles after min to mod verbal, visual, tactile cues.  Person educated: Patient and Spouse Education method: Explanation, Demonstration, Tactile cues, and Verbal cues Education comprehension: verbalized understanding, returned demonstration, verbal cues required, and tactile cues required  HOME EXERCISE PROGRAM:  Access Code: [email or web address] URL: [email or web address] Date: 12/23/2022 Prepared by: Grier Rocher  Exercises - Standing Tandem Balance with Counter Support  - 1 x daily - 5 x weekly - 3 sets - 4 reps - 10 hold - Standing Single Leg Stance with Counter Support  - 1 x daily - 5 x weekly - 3 sets - 4 reps - 10 hold  Access Code: [email or web address] URL: [email or web address] Date: 11/03/2022 Prepared by: Precious Bard  Exercises - Seated March  - 1 x daily - 7 x weekly - 2 sets - 10 reps - 5 hold - Seated Long Arc Quad  - 1 x daily - 7 x weekly - 2 sets - 10 reps - 5 hold - Seated Heel Toe Raises  - 1 x daily - 7 x weekly - 2 sets - 10 reps - 5 hold - Seated Hip Abduction  - 1 x daily - 7 x weekly - 2 sets - 10 reps - 5 hold - Seated Isometric Hip Adduction with Ball  - 1 x daily - 7 x weekly - 2 sets - 10 reps - 5 hold  GOALS: Goals reviewed with patient? Yes  SHORT TERM GOALS: Target date: 01/17/2023  Patient will be independent in home exercise program to improve strength/mobility for  better functional independence with ADLs. Baseline:4/24: HEP given  Goal status: IN PROGRESS    LONG TERM GOALS: Target date: 04/11/2023(corrected)  Patient will increase FOTO score to equal to or greater than  56 to demonstrate statistically significant improvement in mobility and quality of life.   Baseline: 40 6/3: 41 7/8:54 7/23: 52 Goal status: REVISED  2.  Patient (> 30 years old) will complete five times sit to stand test in < 15 seconds indicating an increased LE strength and improved balance. Baseline: 4/24: 38.37 with BUE support 6/3: 12.02 sec with UE support 7/8: 18.12 sec without UE assist 7/23: 16.93 without UE assist  Goal status: IN PROGRESS  3.  Patient will increase 10 meter walk test to >1.19m/s as to improve gait speed for better community ambulation and to reduce fall risk. Baseline: 4/24: 19.2 seconds with RW; dragging of R foot  6/3: 17.175 with no AD and CGA from PT.  7/8: 14.6 sec without AD (0.87m/s) 7/23: 13.21sec without AD (0.70m/s)   Goal status: IN PROGRESS  4.  Patient will increase Berg Balance score by > 45points to demonstrate decreased fall risk during functional activities. Baseline: 4/24: see next session 5/2: 30/56 6/3: 31/56 7/8 42/56  7/23:41/56  Goal status: REVISED In progress    5.  Patient will increase 2 min walk test  >300 ft as to demonstrate reduced fall risk and improved dynamic gait balance for better safety with community/home ambulation.   Baseline: 201 7/23: 245 with min assist due to heavy R foot drag for last 55ft  Goal status: REVISED in progress,      ASSESSMENT:  CLINICAL IMPRESSION:   Patient appeared ready for treatment on this day. Pt demonstrated improved posture and step length compared to last session, but continues to be limited by R inattention with fatigue. CGA for stair management training with only instruction to improve attention to the RUE on descent to reduce fall risk by improper use of rail.  PT treatments will benefit to be reduced to 1x per week to maximize overall therapeutic benefit for PT services.   Pt will continue to benefit from skilled therapy to address remaining deficits in order to improve overall QoL and return to PLOF.      OBJECTIVE IMPAIRMENTS: Abnormal gait, cardiopulmonary status limiting activity, decreased activity tolerance, decreased balance, decreased cognition, decreased coordination, decreased endurance, decreased knowledge of condition, decreased knowledge of use of DME, decreased mobility, difficulty walking, decreased strength, decreased safety awareness, impaired perceived functional ability, impaired flexibility, impaired UE functional use, impaired vision/preception, improper body mechanics, and postural dysfunction.   ACTIVITY LIMITATIONS: carrying, lifting, bending, sitting, standing, squatting, sleeping, stairs, transfers, bed mobility, bathing, toileting, reach over head, hygiene/grooming, locomotion level, and caring for others  PARTICIPATION LIMITATIONS: meal prep, cleaning, laundry, medication management, personal finances, interpersonal relationship, driving, shopping, community activity, and yard work  PERSONAL FACTORS: Age, Past/current experiences, Time since onset of injury/illness/exacerbation, Transportation, and 3+ comorbidities: GERD, OA, R knee/hip pain, Barrett's esophagus  are also affecting patient's functional outcome.   REHAB POTENTIAL: Good  CLINICAL DECISION MAKING: Evolving/moderate complexity  EVALUATION COMPLEXITY: Moderate  PLAN:  PT FREQUENCY: 2x/week  PT DURATION: 12 weeks  PLANNED INTERVENTIONS: Therapeutic exercises, Therapeutic activity, Neuromuscular re-education, Balance training, Gait training, Patient/Family education, Self Care, Joint mobilization, Joint manipulation, Stair training, Vestibular training, Canalith repositioning, Visual/preceptual remediation/compensation, Orthotic/Fit training, Electrical  stimulation, Wheelchair mobility training, Spinal mobilization, Cryotherapy, Moist heat, Splintting, Taping, Ultrasound, Ionotophoresis 4mg /ml Dexamethasone, Manual therapy, and Re-evaluation  PLAN  FOR NEXT SESSION:   Endurance training BLE strengthening.  Functional gait training.  Attention to the R side     Grier Rocher PT, DPT  Physical Therapist - [facility]  [facility]  5:21 PM 03/03/23

## 2023-03-08 ENCOUNTER — Ambulatory Visit: Payer: BC Managed Care – PPO | Admitting: Physical Therapy

## 2023-03-08 ENCOUNTER — Ambulatory Visit: Payer: BC Managed Care – PPO

## 2023-03-08 ENCOUNTER — Encounter: Payer: BC Managed Care – PPO | Admitting: Speech Pathology

## 2023-03-08 DIAGNOSIS — M6281 Muscle weakness (generalized): Secondary | ICD-10-CM

## 2023-03-08 DIAGNOSIS — R278 Other lack of coordination: Secondary | ICD-10-CM

## 2023-03-08 DIAGNOSIS — H539 Unspecified visual disturbance: Secondary | ICD-10-CM

## 2023-03-08 DIAGNOSIS — R2681 Unsteadiness on feet: Secondary | ICD-10-CM

## 2023-03-08 DIAGNOSIS — R262 Difficulty in walking, not elsewhere classified: Secondary | ICD-10-CM

## 2023-03-08 NOTE — Therapy (Signed)
OUTPATIENT PHYSICAL THERAPY NEURO TREATMENT     Patient Name: Mark Durham MRN: [id number] DOB:01-18-1951, 72 y.o., male Today's Date: 03/08/2023   PCP: [facility] REFERRING PROVIDER: Hope Pigeon MD   END OF SESSION:  PT End of Session - 03/08/23 1544     Visit Number 29    Number of Visits 41    Date for PT Re-Evaluation 04/11/23    Authorization Type [employer] COMM PRO    Authorization Time Period 11/03/22-01/26/23    PT Start Time 1534    PT Stop Time 1615    PT Time Calculation (min) 41 min    Equipment Utilized During Treatment Gait belt    Activity Tolerance Patient tolerated treatment well    Behavior During Therapy WFL for tasks assessed/performed               Past Medical History:  Diagnosis Date   CVA (cerebral vascular accident) (HCC)    Hypertension    Past Surgical History:  Procedure Laterality Date   TOTAL HIP ARTHROPLASTY Right    Patient Active Problem List   Diagnosis Date Noted   Sepsis due to pneumonia (HCC) 01/09/2023   Severe sepsis (HCC) 01/09/2023   UTI (urinary tract infection) 01/09/2023   Stroke (HCC) 01/09/2023   HTN (hypertension) 01/09/2023   HLD (hyperlipidemia) 01/09/2023   Hypokalemia 01/09/2023   Tobacco use 01/09/2023   Obesity (BMI 30-39.9) 01/09/2023   CAP (community acquired pneumonia) 01/09/2023   Hypomagnesemia 01/09/2023   Hypophosphatemia 01/09/2023   TIA (transient ischemic attack) 01/05/2023   GERD (gastroesophageal reflux disease) 02/26/2021   S/P hip replacement, right 03/04/2020   Primary osteoarthritis of right hip 01/24/2020   Chronic pain of right knee 11/23/2019   Chronic right hip pain 11/23/2019   Osteoarthritis of knee 11/13/2018   History of adenomatous polyp of colon 02/14/2018   History of Barrett's esophagus 02/14/2018    ONSET DATE: January 2024  REFERRING DIAG: weakness due to stroke   THERAPY DIAG:  Muscle weakness (generalized)  Other lack of coordination  Vision  disturbance following CVA (cerebrovascular accident)  Difficulty in walking, not elsewhere classified  Unsteadiness on feet  Visual disturbance following cerebrovascular accident (CVA)  Rationale for Evaluation and Treatment: Rehabilitation  SUBJECTIVE:                                                                                                                                                                                             SUBJECTIVE STATEMENT:  Pt reports doing well today. No new updates since prior session.   Pt accompanied by: significant other  PERTINENT  HISTORY: Patient presents to physical therapy for weakness s/p stroke . 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 Per documentation he had a secondary stroke during the hospital stay as well as three seizures. Patient was admitted to [facility] Acute inpatient rehabilitation from 08/10/22-08/31/22 where he was discharged home. Patient was discharged from Home health on 10/28/22 (PT, OT, and ST). PMH includes GERD, OA, R knee/hip pain, Barrett's esophagus. Is using a walker, still drags right foot. Needs assistance for transfers but can bath and dress self.   PAIN:  Are you having pain? No  PRECAUTIONS: Fall  WEIGHT BEARING RESTRICTIONS: No  FALLS: Has patient fallen in last 6 months? No    PATIENT GOALS: walking with less AD, get back to mowing the yard, gardening, grilling     OBJECTIVE:   TODAY'S TREATMENT: DATE: 03/08/23  PT applied gait belt for safety. CGA for safety througout session unless otherwise noted.   Nustep BLE/BUE level 2 x 2.5 min and BLE only x 2.5 min. noted to have improved ROM with use of BLE only.   In parallel bars:  Side stepping R and L 40ft x 5 bil  Lateral foot tap on 4inch step.  X 12 bil  Lateral step up/down x 3 bil on 4inch step.  Forward step up/down x 12 BLE    Gait training through rehab gym and hall x 148ft without AD. Additional  gait through rehab gym to locate 6 targets on the R hand side 2 x 58ft.  Min cues to improve visual scanning in upper right quadrant as well as improved awareness of obstacles on ground to reduce fall risk.      Stair management training 2 x 4 with short standing rest break between bout. Min multimodal cues for attention to the RUE to reduce fall risk and improve safety with descent>ascent.      PATIENT EDUCATION: Education details: goals, POC, HEP Pt educated throughout session about proper posture and technique with exercises. Improved exercise technique, movement at target joints, use of target muscles after min to mod verbal, visual, tactile cues.  Person educated: Patient and Spouse Education method: Explanation, Demonstration, Tactile cues, and Verbal cues Education comprehension: verbalized understanding, returned demonstration, verbal cues required, and tactile cues required  HOME EXERCISE PROGRAM:  Access Code: [email or web address] URL: [email or web address] Date: 12/23/2022 Prepared by: Grier Rocher  Exercises - Standing Tandem Balance with Counter Support  - 1 x daily - 5 x weekly - 3 sets - 4 reps - 10 hold - Standing Single Leg Stance with Counter Support  - 1 x daily - 5 x weekly - 3 sets - 4 reps - 10 hold  Access Code: [email or web address] URL: [email or web address] Date: 11/03/2022 Prepared by: Precious Bard  Exercises - Seated March  - 1 x daily - 7 x weekly - 2 sets - 10 reps - 5 hold - Seated Long Arc Quad  - 1 x daily - 7 x weekly - 2 sets - 10 reps - 5 hold - Seated Heel Toe Raises  - 1 x daily - 7 x weekly - 2 sets - 10 reps - 5 hold - Seated Hip Abduction  - 1 x daily - 7 x weekly - 2 sets - 10 reps - 5 hold - Seated Isometric Hip Adduction with Ball  - 1 x daily - 7 x weekly - 2 sets - 10 reps - 5 hold  GOALS: Goals reviewed with patient? Yes  SHORT TERM  GOALS: Target date: 01/17/2023  Patient will be independent in home exercise program to  improve strength/mobility for better functional independence with ADLs. Baseline:4/24: HEP given  Goal status: IN PROGRESS    LONG TERM GOALS: Target date: 04/11/2023(corrected)  Patient will increase FOTO score to equal to or greater than  56 to demonstrate statistically significant improvement in mobility and quality of life.   Baseline: 40 6/3: 41 7/8:54 7/23: 52 Goal status: REVISED  2.  Patient (> 51 years old) will complete five times sit to stand test in < 15 seconds indicating an increased LE strength and improved balance. Baseline: 4/24: 38.37 with BUE support 6/3: 12.02 sec with UE support 7/8: 18.12 sec without UE assist 7/23: 16.93 without UE assist  Goal status: IN PROGRESS  3.  Patient will increase 10 meter walk test to >1.67m/s as to improve gait speed for better community ambulation and to reduce fall risk. Baseline: 4/24: 19.2 seconds with RW; dragging of R foot  6/3: 17.175 with no AD and CGA from PT.  7/8: 14.6 sec without AD (0.27m/s) 7/23: 13.21sec without AD (0.66m/s)   Goal status: IN PROGRESS  4.  Patient will increase Berg Balance score by > 45points to demonstrate decreased fall risk during functional activities. Baseline: 4/24: see next session 5/2: 30/56 6/3: 31/56 7/8 42/56  7/23:41/56  Goal status: REVISED In progress    5.  Patient will increase 2 min walk test  >300 ft as to demonstrate reduced fall risk and improved dynamic gait balance for better safety with community/home ambulation.   Baseline: 201 7/23: 245 with min assist due to heavy R foot drag for last 40ft  Goal status: REVISED in progress,      ASSESSMENT:  CLINICAL IMPRESSION:   Patient appeared ready for treatment on this day. PT treatment focused on improved step length.width on the RLE as well as functional gait with forced attention to the RLE. Pt noted to have improved step length on the RLE while forced to improve visual scanning to the R compared to last session.   Pt will continue to benefit from skilled therapy to address remaining deficits in order to improve overall QoL and return to PLOF.      OBJECTIVE IMPAIRMENTS: Abnormal gait, cardiopulmonary status limiting activity, decreased activity tolerance, decreased balance, decreased cognition, decreased coordination, decreased endurance, decreased knowledge of condition, decreased knowledge of use of DME, decreased mobility, difficulty walking, decreased strength, decreased safety awareness, impaired perceived functional ability, impaired flexibility, impaired UE functional use, impaired vision/preception, improper body mechanics, and postural dysfunction.   ACTIVITY LIMITATIONS: carrying, lifting, bending, sitting, standing, squatting, sleeping, stairs, transfers, bed mobility, bathing, toileting, reach over head, hygiene/grooming, locomotion level, and caring for others  PARTICIPATION LIMITATIONS: meal prep, cleaning, laundry, medication management, personal finances, interpersonal relationship, driving, shopping, community activity, and yard work  PERSONAL FACTORS: Age, Past/current experiences, Time since onset of injury/illness/exacerbation, Transportation, and 3+ comorbidities: GERD, OA, R knee/hip pain, Barrett's esophagus  are also affecting patient's functional outcome.   REHAB POTENTIAL: Good  CLINICAL DECISION MAKING: Evolving/moderate complexity  EVALUATION COMPLEXITY: Moderate  PLAN:  PT FREQUENCY: 2x/week  PT DURATION: 12 weeks  PLANNED INTERVENTIONS: Therapeutic exercises, Therapeutic activity, Neuromuscular re-education, Balance training, Gait training, Patient/Family education, Self Care, Joint mobilization, Joint manipulation, Stair training, Vestibular training, Canalith repositioning, Visual/preceptual remediation/compensation, Orthotic/Fit training, Electrical stimulation, Wheelchair mobility training, Spinal mobilization, Cryotherapy, Moist heat, Splintting, Taping, Ultrasound,  Ionotophoresis 4mg /ml Dexamethasone, Manual therapy, and Re-evaluation  PLAN FOR NEXT SESSION:  Functional gait training.  Attention to the R side  Advance HEP   Grier Rocher PT, DPT  Physical Therapist - [facility]  [facility]  4:14 PM 03/08/23

## 2023-03-10 ENCOUNTER — Ambulatory Visit: Payer: BC Managed Care – PPO | Admitting: Physical Therapy

## 2023-03-10 ENCOUNTER — Encounter: Payer: BC Managed Care – PPO | Admitting: Speech Pathology

## 2023-03-10 ENCOUNTER — Ambulatory Visit: Payer: BC Managed Care – PPO

## 2023-03-15 ENCOUNTER — Ambulatory Visit: Payer: Medicare Other | Attending: *Deleted | Admitting: Physical Therapy

## 2023-03-15 ENCOUNTER — Encounter: Payer: BC Managed Care – PPO | Admitting: Speech Pathology

## 2023-03-15 ENCOUNTER — Ambulatory Visit: Payer: Medicare Other

## 2023-03-15 DIAGNOSIS — H539 Unspecified visual disturbance: Secondary | ICD-10-CM | POA: Diagnosis present

## 2023-03-15 DIAGNOSIS — M6281 Muscle weakness (generalized): Secondary | ICD-10-CM | POA: Insufficient documentation

## 2023-03-15 DIAGNOSIS — R278 Other lack of coordination: Secondary | ICD-10-CM | POA: Insufficient documentation

## 2023-03-15 DIAGNOSIS — I69398 Other sequelae of cerebral infarction: Secondary | ICD-10-CM | POA: Insufficient documentation

## 2023-03-15 DIAGNOSIS — R2681 Unsteadiness on feet: Secondary | ICD-10-CM | POA: Insufficient documentation

## 2023-03-15 DIAGNOSIS — R262 Difficulty in walking, not elsewhere classified: Secondary | ICD-10-CM | POA: Insufficient documentation

## 2023-03-15 NOTE — Therapy (Signed)
OUTPATIENT PHYSICAL THERAPY NEURO TREATMENT  PHYSICAL THERAPY PROGRESS NOTE   Dates of reporting period  02/01/2023   to   03/15/2023    Patient Name: Mark Durham MRN: [id number] DOB:01-08-1951, 72 y.o., male Today's Date: 03/15/2023   PCP: [facility] REFERRING PROVIDER: Hope Pigeon MD   END OF SESSION:  PT End of Session - 03/15/23 1450     Visit Number 30    Number of Visits 41    Date for PT Re-Evaluation 04/11/23    Authorization Type BCBS COMM PRO    Authorization Time Period 11/03/22-01/26/23    PT Start Time 1448    PT Stop Time 1530    PT Time Calculation (min) 42 min    Equipment Utilized During Treatment Gait belt    Activity Tolerance Patient tolerated treatment well    Behavior During Therapy WFL for tasks assessed/performed               Past Medical History:  Diagnosis Date   CVA (cerebral vascular accident) (HCC)    Hypertension    Past Surgical History:  Procedure Laterality Date   TOTAL HIP ARTHROPLASTY Right    Patient Active Problem List   Diagnosis Date Noted   Sepsis due to pneumonia (HCC) 01/09/2023   Severe sepsis (HCC) 01/09/2023   UTI (urinary tract infection) 01/09/2023   Stroke (HCC) 01/09/2023   HTN (hypertension) 01/09/2023   HLD (hyperlipidemia) 01/09/2023   Hypokalemia 01/09/2023   Tobacco use 01/09/2023   Obesity (BMI 30-39.9) 01/09/2023   CAP (community acquired pneumonia) 01/09/2023   Hypomagnesemia 01/09/2023   Hypophosphatemia 01/09/2023   TIA (transient ischemic attack) 01/05/2023   GERD (gastroesophageal reflux disease) 02/26/2021   S/P hip replacement, right 03/04/2020   Primary osteoarthritis of right hip 01/24/2020   Chronic pain of right knee 11/23/2019   Chronic right hip pain 11/23/2019   Osteoarthritis of knee 11/13/2018   History of adenomatous polyp of colon 02/14/2018   History of Barrett's esophagus 02/14/2018    ONSET DATE: January 2024  REFERRING DIAG: weakness due to stroke    THERAPY DIAG:  Muscle weakness (generalized)  Other lack of coordination  Vision disturbance following CVA (cerebrovascular accident)  Difficulty in walking, not elsewhere classified  Unsteadiness on feet  Rationale for Evaluation and Treatment: Rehabilitation  SUBJECTIVE:                                                                                                                                                                                             SUBJECTIVE STATEMENT:  Pt reports doing well today. No new updates since  prior session.   Pt accompanied by: significant other  PERTINENT HISTORY: Patient presents to physical therapy for weakness s/p stroke . 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 Per documentation he had a secondary stroke during the hospital stay as well as three seizures. Patient was admitted to [facility] Acute inpatient rehabilitation from 08/10/22-08/31/22 where he was discharged home. Patient was discharged from Home health on 10/28/22 (PT, OT, and ST). PMH includes GERD, OA, R knee/hip pain, Barrett's esophagus. Is using a walker, still drags right foot. Needs assistance for transfers but can bath and dress self.   PAIN:  Are you having pain? No  PRECAUTIONS: Fall  WEIGHT BEARING RESTRICTIONS: No  FALLS: Has patient fallen in last 6 months? No    PATIENT GOALS: walking with less AD, get back to mowing the yard, gardening, grilling     OBJECTIVE:   TODAY'S TREATMENT: DATE: 03/15/23  PT instructed pt in progress not assessment:   Pt performed 5 time sit<>stand (5xSTS): 14.30 sec sec (>15 sec indicates increased fall risk)   10 Meter Walk Test: Patient instructed to walk 10 meters (32.8 ft) as quickly and as safely as possible at their normal speed x2 and at a fast speed x2. Time measured from 2 meter mark to 8 meter mark to accommodate ramp-up and ramp-down.  Average  speed with AD : RW 14.99 sec   (0.58m/s)  Average speed with no AD : 13.3 sec(0.75 m/s) Cut off scores: <0.4 m/s = household Ambulator, 0.4-0.8 m/s = limited community Ambulator, >0.8 m/s = community Ambulator, >1.2 m/s = crossing a street, <1.0 = increased fall risk MCID 0.05 m/s (small), 0.13 m/s (moderate), 0.06 m/s (significant)  (ANPTA Core Set of Outcome Measures for Adults with Neurologic Conditions, 2018)    2 Min Walk Test:  Instructed patient to ambulate as quickly and as safely as possible for 2 minutes using LRAD. Patient was allowed to take standing rest breaks without stopping the test, but if the patient required a sitting rest break the clock would be stopped and the test would be over.  Results: 252 ft no UE support  29ft with UE support on RW  with CGA for safety. Min cues for step length without AD to prevent anterior LOB and reduce risk for foot drag. Results indicate that the patient has reduced endurance with ambulation compared to age matched norms.       PATIENT EDUCATION: Education details: goals, POC, HEP Pt educated throughout session about proper posture and technique with exercises. Improved exercise technique, movement at target joints, use of target muscles after min to mod verbal, visual, tactile cues.  Person educated: Patient and Spouse Education method: Explanation, Demonstration, Tactile cues, and Verbal cues Education comprehension: verbalized understanding, returned demonstration, verbal cues required, and tactile cues required  HOME EXERCISE PROGRAM:  Access Code: [email or web address] URL: [email or web address] Date: 12/23/2022 Prepared by: Grier Rocher  Exercises - Standing Tandem Balance with Counter Support  - 1 x daily - 5 x weekly - 3 sets - 4 reps - 10 hold - Standing Single Leg Stance with Counter Support  - 1 x daily - 5 x weekly - 3 sets - 4 reps - 10 hold  Access Code: [email or web address] URL: [email or web address] Date: 11/03/2022 Prepared by: Precious Bard  Exercises - Seated March  - 1 x daily - 7 x weekly - 2 sets - 10 reps - 5 hold - Seated Long Arc Quad  - 1 x  daily - 7 x weekly - 2 sets - 10 reps - 5 hold - Seated Heel Toe Raises  - 1 x daily - 7 x weekly - 2 sets - 10 reps - 5 hold - Seated Hip Abduction  - 1 x daily - 7 x weekly - 2 sets - 10 reps - 5 hold - Seated Isometric Hip Adduction with Ball  - 1 x daily - 7 x weekly - 2 sets - 10 reps - 5 hold  GOALS: Goals reviewed with patient? Yes  SHORT TERM GOALS: Target date: 01/17/2023  Patient will be independent in home exercise program to improve strength/mobility for better functional independence with ADLs. Baseline:4/24: HEP given  Goal status: MET    LONG TERM GOALS: Target date: 04/11/2023(corrected)  Patient will increase FOTO score to equal to or greater than  56 to demonstrate statistically significant improvement in mobility and quality of life.   Baseline: 40 6/3: 41 7/8:54 7/23: 52 9/3; 60  Goal status: MET  2.  Patient (> 16 years old) will complete five times sit to stand test in < 15 seconds indicating an increased LE strength and improved balance. Baseline: 4/24: 38.37 with BUE support 6/3: 12.02 sec with UE support 7/8: 18.12 sec without UE assist 7/23: 16.93 without UE assist  9/3: performed with BUE support 14.30 sec without UE support 14.47 sec  Goal status: MET  3.  Patient will increase 10 meter walk test to >1.79m/s as to improve gait speed for better community ambulation and to reduce fall risk. Baseline: 4/24: 19.2 seconds with RW; dragging of R foot  6/3: 17.175 with no AD and CGA from PT.  7/8: 14.6 sec without AD (0.58m/s) 7/23: 13.21sec without AD (0.84m/s)  9/3 with use of RW 14.99 sec. 13.3 sec without AD.   Goal status: IN PROGRESS  4.  Patient will increase Berg Balance score to > 45points to demonstrate decreased fall risk during functional activities. Baseline: 4/24: see next session 5/2: 30/56 6/3: 31/56 7/8 42/56   7/23:41/56  Goal status: REVISED In progress    5.  Patient will increase 2 min walk test  >300 ft as to demonstrate reduced fall risk and improved dynamic gait balance for better safety with community/home ambulation.   Baseline: 201 7/23: 245 with min assist due to heavy R foot drag for last 20ft  9/3 252 ft no UE support  29ft with UE support on RW Goal status: REVISED in progress,      ASSESSMENT:  CLINICAL IMPRESSION:   Patient appeared ready for treatment on this day.  Instructed in Progress note assessment to measure progress towards LTG. Pt demonstrated improved balance and reduced fall risk as evidenced by improved 5xSTS and improved FOTO score. Pt demonstrates no significant change in 2 min walk test and compared to last assessment.  Pt will continue to benefit from skilled therapy to address remaining deficits in order to improve overall QoL and return to PLOF. Patient's condition has the potential to improve in response to therapy. Maximum improvement is yet to be obtained. The anticipated improvement is attainable and reasonable in a generally predictable time.       OBJECTIVE IMPAIRMENTS: Abnormal gait, cardiopulmonary status limiting activity, decreased activity tolerance, decreased balance, decreased cognition, decreased coordination, decreased endurance, decreased knowledge of condition, decreased knowledge of use of DME, decreased mobility, difficulty walking, decreased strength, decreased safety awareness, impaired perceived functional ability, impaired flexibility, impaired UE functional use, impaired vision/preception, improper body mechanics,  and postural dysfunction.   ACTIVITY LIMITATIONS: carrying, lifting, bending, sitting, standing, squatting, sleeping, stairs, transfers, bed mobility, bathing, toileting, reach over head, hygiene/grooming, locomotion level, and caring for others  PARTICIPATION LIMITATIONS: meal prep, cleaning, laundry, medication  management, personal finances, interpersonal relationship, driving, shopping, community activity, and yard work  PERSONAL FACTORS: Age, Past/current experiences, Time since onset of injury/illness/exacerbation, Transportation, and 3+ comorbidities: GERD, OA, R knee/hip pain, Barrett's esophagus  are also affecting patient's functional outcome.   REHAB POTENTIAL: Good  CLINICAL DECISION MAKING: Evolving/moderate complexity  EVALUATION COMPLEXITY: Moderate  PLAN:  PT FREQUENCY: 2x/week  PT DURATION: 12 weeks  PLANNED INTERVENTIONS: Therapeutic exercises, Therapeutic activity, Neuromuscular re-education, Balance training, Gait training, Patient/Family education, Self Care, Joint mobilization, Joint manipulation, Stair training, Vestibular training, Canalith repositioning, Visual/preceptual remediation/compensation, Orthotic/Fit training, Electrical stimulation, Wheelchair mobility training, Spinal mobilization, Cryotherapy, Moist heat, Splintting, Taping, Ultrasound, Ionotophoresis 4mg /ml Dexamethasone, Manual therapy, and Re-evaluation  PLAN FOR NEXT SESSION:    BERG HEP adjustment.  Endurance and forced attention to the R side   Grier Rocher PT, DPT  Physical Therapist - [facility]  [facility]  2:52 PM 03/15/23

## 2023-03-17 ENCOUNTER — Ambulatory Visit: Payer: Medicare Other

## 2023-03-17 ENCOUNTER — Encounter: Payer: BC Managed Care – PPO | Admitting: Speech Pathology

## 2023-03-17 ENCOUNTER — Ambulatory Visit: Payer: Medicare Other | Admitting: Physical Therapy

## 2023-03-22 ENCOUNTER — Encounter: Payer: BC Managed Care – PPO | Admitting: Speech Pathology

## 2023-03-22 ENCOUNTER — Ambulatory Visit: Payer: Medicare Other | Admitting: Occupational Therapy

## 2023-03-22 ENCOUNTER — Ambulatory Visit: Payer: Medicare Other | Admitting: Physical Therapy

## 2023-03-22 DIAGNOSIS — M6281 Muscle weakness (generalized): Secondary | ICD-10-CM

## 2023-03-22 DIAGNOSIS — R2681 Unsteadiness on feet: Secondary | ICD-10-CM

## 2023-03-22 DIAGNOSIS — R262 Difficulty in walking, not elsewhere classified: Secondary | ICD-10-CM

## 2023-03-22 DIAGNOSIS — I69398 Other sequelae of cerebral infarction: Secondary | ICD-10-CM

## 2023-03-22 DIAGNOSIS — R278 Other lack of coordination: Secondary | ICD-10-CM

## 2023-03-22 NOTE — Therapy (Unsigned)
OUTPATIENT PHYSICAL THERAPY NEURO TREATMENT  PHYSICAL THERAPY PROGRESS NOTE   Dates of reporting period  02/01/2023   to   03/15/2023    Patient Name: Mark Durham MRN: [id number] DOB:[birth year], 72 y.o., male Today's Date: 03/22/2023   PCP: [facility] REFERRING PROVIDER: Hope Pigeon MD   END OF SESSION:  PT End of Session - 03/22/23 1542     Visit Number 31    Number of Visits 41    Date for PT Re-Evaluation 04/11/23    Authorization Type BCBS COMM PRO    Authorization Time Period 11/03/22-01/26/23    PT Start Time 1535    PT Stop Time 1615    PT Time Calculation (min) 40 min    Equipment Utilized During Treatment Gait belt    Activity Tolerance Patient tolerated treatment well    Behavior During Therapy WFL for tasks assessed/performed               Past Medical History:  Diagnosis Date   CVA (cerebral vascular accident) (HCC)    Hypertension    Past Surgical History:  Procedure Laterality Date   TOTAL HIP ARTHROPLASTY Right    Patient Active Problem List   Diagnosis Date Noted   Sepsis due to pneumonia (HCC) 01/09/2023   Severe sepsis (HCC) 01/09/2023   UTI (urinary tract infection) 01/09/2023   Stroke (HCC) 01/09/2023   HTN (hypertension) 01/09/2023   HLD (hyperlipidemia) 01/09/2023   Hypokalemia 01/09/2023   Tobacco use 01/09/2023   Obesity (BMI 30-39.9) 01/09/2023   CAP (community acquired pneumonia) 01/09/2023   Hypomagnesemia 01/09/2023   Hypophosphatemia 01/09/2023   TIA (transient ischemic attack) 01/05/2023   GERD (gastroesophageal reflux disease) 02/26/2021   S/P hip replacement, right 03/04/2020   Primary osteoarthritis of right hip 01/24/2020   Chronic pain of right knee 11/23/2019   Chronic right hip pain 11/23/2019   Osteoarthritis of knee 11/13/2018   History of adenomatous polyp of colon 02/14/2018   History of Barrett's esophagus 02/14/2018    ONSET DATE: January 2024  REFERRING DIAG: weakness due to stroke    THERAPY DIAG:  Muscle weakness (generalized)  Other lack of coordination  Difficulty in walking, not elsewhere classified  Unsteadiness on feet  Vision disturbance following CVA (cerebrovascular accident)  Rationale for Evaluation and Treatment: Rehabilitation  SUBJECTIVE:                                                                                                                                                                                             SUBJECTIVE STATEMENT:  Pt reports doing well today. No new updates since  prior session.   Pt accompanied by: significant other  PERTINENT HISTORY: Patient presents to physical therapy for weakness s/p stroke . 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 Per documentation he had a secondary stroke during the hospital stay as well as three seizures. Patient was admitted to [facility] Acute inpatient rehabilitation from 08/10/22-08/31/22 where he was discharged home. Patient was discharged from Home health on 10/28/22 (PT, OT, and ST). PMH includes GERD, OA, R knee/hip pain, Barrett's esophagus. Is using a walker, still drags right foot. Needs assistance for transfers but can bath and dress self.   PAIN:  Are you having pain? No  PRECAUTIONS: Fall  WEIGHT BEARING RESTRICTIONS: No  FALLS: Has patient fallen in last 6 months? No    PATIENT GOALS: walking with less AD, get back to mowing the yard, gardening, grilling     OBJECTIVE:   TODAY'S TREATMENT: DATE: 03/22/23 Patient demonstrates increased fall risk as noted by score of   /56 on Berg Balance Scale.  (<36= high risk for falls, close to 100%; 37-45 significant >80%; 46-51 moderate >50%; 52-55 lower >25%)   [facility] PT Assessment - 03/22/23 0001       Berg Balance Test   Sit to Stand Able to stand without using hands and stabilize independently    Standing Unsupported Able to stand safely 2 minutes    Sitting with Back Unsupported but  Feet Supported on Floor or Stool Able to sit safely and securely 2 minutes    Stand to Sit Sits safely with minimal use of hands    Transfers Able to transfer safely, minor use of hands    Standing Unsupported with Eyes Closed Able to stand 10 seconds safely    Standing Unsupported with Feet Together Able to place feet together independently and stand 1 minute safely    From Standing, Reach Forward with Outstretched Arm Can reach forward >12 cm safely (5")    From Standing Position, Pick up Object from Floor Able to pick up shoe safely and easily    From Standing Position, Turn to Look Behind Over each Shoulder Turn sideways only but maintains balance    Turn 360 Degrees Able to turn 360 degrees safely but slowly    Standing Unsupported, Alternately Place Feet on Step/Stool Able to stand independently and complete 8 steps >20 seconds    Standing Unsupported, One Foot in Front Able to plae foot ahead of the other independently and hold 30 seconds    Standing on One Leg Tries to lift leg/unable to hold 3 seconds but remains standing independently    Total Score 46              Gait without AD x 376ft with supervision assist from PT. Pt required only 1 standing rest break     PATIENT EDUCATION: Education details: goals, POC, HEP Pt educated throughout session about proper posture and technique with exercises. Improved exercise technique, movement at target joints, use of target muscles after min to mod verbal, visual, tactile cues.  Person educated: Patient and Spouse Education method: Explanation, Demonstration, Tactile cues, and Verbal cues Education comprehension: verbalized understanding, returned demonstration, verbal cues required, and tactile cues required  HOME EXERCISE PROGRAM:  Access Code: [email or web address] URL: [email or web address] Date: 12/23/2022 Prepared by: Grier Rocher  Exercises - Standing Tandem Balance with Counter Support  - 1 x daily - 5 x weekly - 3  sets - 4 reps - 10 hold - Standing Single  Leg Stance with Counter Support  - 1 x daily - 5 x weekly - 3 sets - 4 reps - 10 hold  Access Code: [email or web address] URL: [email or web address] Date: 11/03/2022 Prepared by: Precious Bard  Exercises - Seated March  - 1 x daily - 7 x weekly - 2 sets - 10 reps - 5 hold - Seated Long Arc Quad  - 1 x daily - 7 x weekly - 2 sets - 10 reps - 5 hold - Seated Heel Toe Raises  - 1 x daily - 7 x weekly - 2 sets - 10 reps - 5 hold - Seated Hip Abduction  - 1 x daily - 7 x weekly - 2 sets - 10 reps - 5 hold - Seated Isometric Hip Adduction with Ball  - 1 x daily - 7 x weekly - 2 sets - 10 reps - 5 hold  GOALS: Goals reviewed with patient? Yes  SHORT TERM GOALS: Target date: 01/17/2023  Patient will be independent in home exercise program to improve strength/mobility for better functional independence with ADLs. Baseline:4/24: HEP given  Goal status: MET    LONG TERM GOALS: Target date: 04/11/2023(corrected)  Patient will increase FOTO score to equal to or greater than  56 to demonstrate statistically significant improvement in mobility and quality of life.   Baseline: 40 6/3: 41 7/8:54 7/23: 52 9/3; 60  Goal status: MET  2.  Patient (> 61 years old) will complete five times sit to stand test in < 15 seconds indicating an increased LE strength and improved balance. Baseline: 4/24: 38.37 with BUE support 6/3: 12.02 sec with UE support 7/8: 18.12 sec without UE assist 7/23: 16.93 without UE assist  9/3: performed with BUE support 14.30 sec without UE support 14.47 sec  Goal status: MET  3.  Patient will increase 10 meter walk test to >1.59m/s as to improve gait speed for better community ambulation and to reduce fall risk. Baseline: 4/24: 19.2 seconds with RW; dragging of R foot  6/3: 17.175 with no AD and CGA from PT.  7/8: 14.6 sec without AD (0.47m/s) 7/23: 13.21sec without AD (0.12m/s)  9/3 with use of RW 14.99 sec. 13.3 sec without AD.    Goal status: IN PROGRESS  4.  Patient will increase Berg Balance score to > 45points to demonstrate decreased fall risk during functional activities. Baseline: 4/24: see next session 5/2: 30/56 6/3: 31/56 7/8 42/56  7/23:41/56 9/10:46/56  Goal status: MET In progress    5.  Patient will increase 2 min walk test  >300 ft as to demonstrate reduced fall risk and improved dynamic gait balance for better safety with community/home ambulation.   Baseline: 201 7/23: 245 with min assist due to heavy R foot drag for last 71ft  9/3 252 ft no UE support  226ft with UE support on RW Goal status: REVISED in progress,      ASSESSMENT:  CLINICAL IMPRESSION:   Patient  Maximum improvement is yet to be obtained. The anticipated improvement is attainable and reasonable in a generally predictable time.       OBJECTIVE IMPAIRMENTS: Abnormal gait, cardiopulmonary status limiting activity, decreased activity tolerance, decreased balance, decreased cognition, decreased coordination, decreased endurance, decreased knowledge of condition, decreased knowledge of use of DME, decreased mobility, difficulty walking, decreased strength, decreased safety awareness, impaired perceived functional ability, impaired flexibility, impaired UE functional use, impaired vision/preception, improper body mechanics, and postural dysfunction.   ACTIVITY LIMITATIONS: carrying, lifting, bending, sitting, standing, squatting, sleeping, stairs, transfers,  bed mobility, bathing, toileting, reach over head, hygiene/grooming, locomotion level, and caring for others  PARTICIPATION LIMITATIONS: meal prep, cleaning, laundry, medication management, personal finances, interpersonal relationship, driving, shopping, community activity, and yard work  PERSONAL FACTORS: Age, Past/current experiences, Time since onset of injury/illness/exacerbation, Transportation, and 3+ comorbidities: GERD, OA, R knee/hip pain, Barrett's esophagus   are also affecting patient's functional outcome.   REHAB POTENTIAL: Good  CLINICAL DECISION MAKING: Evolving/moderate complexity  EVALUATION COMPLEXITY: Moderate  PLAN:  PT FREQUENCY: 2x/week  PT DURATION: 12 weeks  PLANNED INTERVENTIONS: Therapeutic exercises, Therapeutic activity, Neuromuscular re-education, Balance training, Gait training, Patient/Family education, Self Care, Joint mobilization, Joint manipulation, Stair training, Vestibular training, Canalith repositioning, Visual/preceptual remediation/compensation, Orthotic/Fit training, Electrical stimulation, Wheelchair mobility training, Spinal mobilization, Cryotherapy, Moist heat, Splintting, Taping, Ultrasound, Ionotophoresis 4mg /ml Dexamethasone, Manual therapy, and Re-evaluation  PLAN FOR NEXT SESSION:    BERG HEP adjustment.  Endurance and forced attention to the R side   Grier Rocher PT, DPT  Physical Therapist - [facility]  [facility]  3:43 PM 03/22/23

## 2023-03-24 ENCOUNTER — Ambulatory Visit: Payer: Medicare Other | Admitting: Physical Therapy

## 2023-03-24 ENCOUNTER — Ambulatory Visit: Payer: Medicare Other

## 2023-03-24 ENCOUNTER — Encounter: Payer: BC Managed Care – PPO | Admitting: Speech Pathology

## 2023-03-29 ENCOUNTER — Encounter: Payer: BC Managed Care – PPO | Admitting: Speech Pathology

## 2023-03-29 ENCOUNTER — Ambulatory Visit: Payer: Medicare Other | Admitting: Physical Therapy

## 2023-03-29 ENCOUNTER — Ambulatory Visit: Payer: Medicare Other

## 2023-03-31 ENCOUNTER — Other Ambulatory Visit: Payer: Self-pay

## 2023-03-31 ENCOUNTER — Encounter: Payer: BC Managed Care – PPO | Admitting: Speech Pathology

## 2023-03-31 ENCOUNTER — Ambulatory Visit: Payer: Medicare Other | Admitting: Physical Therapy

## 2023-03-31 ENCOUNTER — Emergency Department: Payer: Medicare Other

## 2023-03-31 ENCOUNTER — Ambulatory Visit: Payer: Medicare Other

## 2023-03-31 ENCOUNTER — Emergency Department
Admission: EM | Admit: 2023-03-31 | Discharge: 2023-03-31 | Disposition: A | Discharge status: Home / Self Care | Payer: Medicare Other | Attending: Emergency Medicine | Admitting: Emergency Medicine

## 2023-03-31 DIAGNOSIS — E236 Other disorders of pituitary gland: Secondary | ICD-10-CM | POA: Insufficient documentation

## 2023-03-31 DIAGNOSIS — Z79899 Other long term (current) drug therapy: Secondary | ICD-10-CM | POA: Insufficient documentation

## 2023-03-31 DIAGNOSIS — R7989 Other specified abnormal findings of blood chemistry: Secondary | ICD-10-CM | POA: Insufficient documentation

## 2023-03-31 DIAGNOSIS — Z8673 Personal history of transient ischemic attack (TIA), and cerebral infarction without residual deficits: Secondary | ICD-10-CM | POA: Diagnosis not present

## 2023-03-31 DIAGNOSIS — R569 Unspecified convulsions: Secondary | ICD-10-CM | POA: Diagnosis present

## 2023-03-31 DIAGNOSIS — I6782 Cerebral ischemia: Secondary | ICD-10-CM | POA: Diagnosis not present

## 2023-03-31 LAB — COMPREHENSIVE METABOLIC PANEL
ALT: 22 U/L (ref 0–44)
AST: 22 U/L (ref 15–41)
Albumin: 4.1 g/dL (ref 3.5–5.0)
Alkaline Phosphatase: 130 U/L — ABNORMAL HIGH (ref 38–126)
Anion gap: 14 (ref 5–15)
BUN: 18 mg/dL (ref 8–23)
CO2: 21 mmol/L — ABNORMAL LOW (ref 22–32)
Calcium: 9.5 mg/dL (ref 8.9–10.3)
Chloride: 103 mmol/L (ref 98–111)
Creatinine, Ser: 1.08 mg/dL (ref 0.61–1.24)
GFR, Estimated: >60 mL/min (ref >60)
Glucose, Bld: 140 mg/dL — ABNORMAL HIGH (ref 70–99)
Potassium: 3.1 mmol/L — ABNORMAL LOW (ref 3.5–5.1)
Sodium: 138 mmol/L (ref 135–145)
Total Bilirubin: 0.9 mg/dL (ref 0.3–1.2)
Total Protein: 8.5 g/dL — ABNORMAL HIGH (ref 6.5–8.1)

## 2023-03-31 LAB — CBC WITH DIFFERENTIAL/PLATELET
Abs Immature Granulocytes: 0.04 10*3/uL (ref 0.00–0.07)
Basophils Absolute: 0 10*3/uL (ref 0.0–0.1)
Basophils Relative: 0 %
Eosinophils Absolute: 0.2 10*3/uL (ref 0.0–0.5)
Eosinophils Relative: 2 %
HCT: 40.7 % (ref 39.0–52.0)
Hemoglobin: 12.9 g/dL — ABNORMAL LOW (ref 13.0–17.0)
Immature Granulocytes: 0 %
Lymphocytes Relative: 21 %
Lymphs Abs: 2 10*3/uL (ref 0.7–4.0)
MCH: 24.5 pg — ABNORMAL LOW (ref 26.0–34.0)
MCHC: 31.7 g/dL (ref 30.0–36.0)
MCV: 77.4 fL — ABNORMAL LOW (ref 80.0–100.0)
Monocytes Absolute: 0.8 10*3/uL (ref 0.1–1.0)
Monocytes Relative: 8 %
Neutro Abs: 6.9 10*3/uL (ref 1.7–7.7)
Neutrophils Relative %: 69 %
Platelets: 253 10*3/uL (ref 150–400)
RBC: 5.26 MIL/uL (ref 4.22–5.81)
RDW: 14.7 % (ref 11.5–15.5)
WBC: 9.9 10*3/uL (ref 4.0–10.5)
nRBC: 0 % (ref 0.0–0.2)

## 2023-03-31 LAB — URINE DRUG SCREEN, QUALITATIVE (ARMC ONLY)
Amphetamines, Ur Screen: NOT DETECTED
Barbiturates, Ur Screen: NOT DETECTED
Benzodiazepine, Ur Scrn: NOT DETECTED
Cannabinoid 50 Ng, Ur ~~LOC~~: NOT DETECTED
Cocaine Metabolite,Ur ~~LOC~~: NOT DETECTED
MDMA (Ecstasy)Ur Screen: NOT DETECTED
Methadone Scn, Ur: NOT DETECTED
Opiate, Ur Screen: NOT DETECTED
Phencyclidine (PCP) Ur S: NOT DETECTED
Tricyclic, Ur Screen: NOT DETECTED

## 2023-03-31 LAB — PROTIME-INR
INR: 1.1 (ref 0.8–1.2)
Prothrombin Time: 14.4 seconds (ref 11.4–15.2)

## 2023-03-31 LAB — LACTIC ACID, PLASMA
Lactic Acid, Venous: 3.7 mmol/L (ref 0.5–1.9)
Lactic Acid, Venous: 1.5 mmol/L (ref 0.5–1.9)

## 2023-03-31 LAB — ETHANOL: Alcohol, Ethyl (B): <10 mg/dL (ref <10)

## 2023-03-31 LAB — MAGNESIUM: Magnesium: 1.7 mg/dL (ref 1.7–2.4)

## 2023-03-31 MED ORDER — LACTATED RINGERS IV BOLUS
1000.0000 mL | Freq: Once | INTRAVENOUS | Status: AC
  Administered 2023-03-31: 1000 mL via INTRAVENOUS

## 2023-03-31 MED ORDER — POTASSIUM CHLORIDE 20 MEQ PO PACK: 40.0000 meq | PACK | ORAL | Status: DC

## 2023-03-31 NOTE — ED Triage Notes (Signed)
Patient arrives by EMS from home after having a seizure at home.  Patient's family states he has no history of seizures, but did have two while in the hospital following a stroke.  Patient was on a seizure medication for a while after his stroke, but his doctors took him off of it.

## 2023-03-31 NOTE — ED Provider Notes (Signed)
[facility] Provider Note    Event Date/Time   First MD Initiated Contact with Patient 03/31/23 [phone]     (approximate)   History   Seizures  History is somewhat limited by postictal state and largely provided by the patient's wife  HPI Mark Durham is a 72 y.o. 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  He presents by EMS for evaluation after a seizure at home.  His wife reports that he called her name twice and then started shaking all over similar to prior seizure episodes that he had earlier in the year.  She was concerned at 1 point he may have stopped breathing.  She called 911 immediately and the paramedics evaluated him and transported him in.  By that point he had stopped with the seizure-like activity but was confused and not communicating initially.  That gradually improved and he is back to his baseline currently, answering questions, relatively cheerful, in no distress.  He does not remember what happened.  However he denies pain, shortness of breath, nausea, and vomiting.  He did not have any trauma.  His wife does not believe that he is on blood thinners.     Physical Exam   Triage Vital Signs: ED Triage Vitals [03/31/23 0104]  Encounter Vitals Group     BP (!) 160/84     Systolic BP Percentile      Diastolic BP Percentile      Pulse Rate 100     Resp (!) 23     Temp 98.4 F (36.9 C)     Temp Source Oral     SpO2 90 %     Weight      Height      Head Circumference      Peak Flow      Pain Score      Pain Loc      Pain Education      Exclude from Growth Chart     Most recent vital signs: Vitals:   03/31/23 0330 03/31/23 0400  BP: 125/76 (!) 142/81  Pulse: 88 88  Resp: 18 18  Temp:    SpO2: 96% 96%    General: Awake, no distress.  Generally  well-appearing. CV:  Good peripheral perfusion.  Regular rate and rhythm. Resp:  Normal effort. Speaking easily and comfortably, no accessory muscle usage nor intercostal retractions.   Abd:  No distention.  No tenderness to palpation. Other:  Patient has quite strong grip strength bilaterally and normal range of motion and movements of major muscle groups.  No evidence of decreased muscle strength on either side.  No obvious cranial nerve deficits.  Patient is somewhat confused but according to his wife and his wife's daughter this seems to be baseline for him.   ED Results / Procedures / Treatments   Labs (all labs ordered are listed, but only abnormal results are displayed) Labs Reviewed  LACTIC ACID, PLASMA - Abnormal; Notable for the following components:      Result Value   Lactic Acid, Venous 3.7 (*)    All other components within normal limits  COMPREHENSIVE METABOLIC PANEL - Abnormal; Notable for the following components:   Potassium 3.1 (*)    CO2 21 (*)    Glucose, Bld 140 (*)  Total Protein 8.5 (*)    Alkaline Phosphatase 130 (*)    All other components within normal limits  CBC WITH DIFFERENTIAL/PLATELET - Abnormal; Notable for the following components:   Hemoglobin 12.9 (*)    MCV 77.4 (*)    MCH 24.5 (*)    All other components within normal limits  PROTIME-INR  MAGNESIUM  ETHANOL  URINE DRUG SCREEN, QUALITATIVE ([facility] ONLY)  LACTIC ACID, PLASMA  CBG MONITORING, ED     EKG  ED ECG REPORT I, Loleta Rose, the attending physician, personally viewed and interpreted this ECG.  Date: 03/31/2023 EKG Time: 1:04 AM Rate: 100 Rhythm: Sinus tachycardia (borderline) QRS Axis: normal Intervals: normal ST/T Wave abnormalities: normal Narrative Interpretation: no evidence of acute ischemia    RADIOLOGY I viewed and interpreted the patient's CT of the head.  There are no acute findings such as subarachnoid or subdural hemorrhage.  Radiology confirmed chronic  changes but no acute abnormalities.   PROCEDURES:  Critical Care performed: No  .1-3 Lead EKG Interpretation  Performed by: Loleta Rose, MD Authorized by: Loleta Rose, MD     Interpretation: normal     ECG rate:  85   ECG rate assessment: normal     Rhythm: sinus rhythm     Ectopy: none     Conduction: normal       IMPRESSION / MDM / ASSESSMENT AND PLAN / ED COURSE  I reviewed the triage vital signs and the nursing notes.                              Differential diagnosis includes, but is not limited to, seizure, nonepileptic seizure-like activity, acute infectious process, CVA.  Patient's presentation is most consistent with acute presentation with potential threat to life or bodily function.  Labs/studies ordered: Lactic acid x 2, ethanol level, magnesium, CBC with differential, pro time-INR in case he has a bleed and might need reversal, CMP, CT head, EKG  Interventions/Medications given:  Medications  potassium chloride (KLOR-CON) packet 40 mEq (40 mEq Oral Not Given 03/31/23 0551)  lactated ringers bolus 1,000 mL (0 mLs Intravenous Stopped 03/31/23 0551)    (Note:  hospital course my include additional interventions and/or labs/studies not listed above.)   Vital signs are stable within normal limits.  Patient's initial presentation is consistent with postictal state.  I reviewed his medical record including multiple outpatient notes by neurology and primary care and his history of CVA at [facility]  I confirmed that he was on Keppra and Vimpat until summer, 2 to 3 months ago, when he came off of that after an EEG showing some cognitive slowing but no seizure-like activity.  There is no indication the patient has been suffering from an acute infectious process and his vital signs are reassuring.  He has no focal neurological deficits and although initially he was not talking he has returned to his baseline.  Lactic acid is pending which I ordered as essentially  confirmatory to prove he had some seizure-like activity.  The rest of his lab work is essentially normal with no substantial abnormalities although he does have a slightly decreased potassium so ordered potassium repletion 40 mill equivalents by mouth.  I ordered a stroke swallow screen to make sure we can document that he has no acute swelling difficulties.  The patient is on the cardiac monitor to evaluate for evidence of arrhythmia and/or significant heart rate changes.  As documented above,  CT head shows no acute findings.  No acute ischemia on EKG of the patient is having no chest pain.  I consider an MRI but I do not think it would be particularly helpful given no signs of acute stroke.   Clinical Course as of 03/31/23 0804  Thu Mar 31, 2023  0248 Lactic Acid, Venous(!!): 3.7 The patient is noted to have a lactate>4. With the current information available to me, I don't think the patient is in septic shock. The lactate>4, is related to seizure activity.  I ordered 1 L LR IV bolus and then will send another lactic acid to make sure it is coming down.  [CF]  0437 I had an extensive discussion with the patient and his family.  We talked about various options including admission, restarting antiepileptic medications, or no acute interventions and close follow-up.  The patient's wife reports that she just yesterday got a text from neurology at [facility] asking her to call and schedule follow-up appointment.  She and her daughter would prefer to call in the morning and let them know what happened and that he had a seizure.  This has the benefit of not interfering if they have a plan for an outpatient EEG and therapy guided by his regular neurologist.  This is the family's preference and seems reasonable to me.  Once the fluids are complete and the patient's lactic acid has shown improvement, they are comfortable with the plan to take him home and follow-up as an outpatient. [CF]  0800 Transferred ED care to  Dr. Roxan Hockey to follow-up on repeat lactic acid.  If the patient remains stable and asymptomatic and there is lactic acid has down trended since the original, patient should be appropriate for discharge as previously explained and discussed. [CF]    Clinical Course User Index [CF] Loleta Rose, MD     FINAL CLINICAL IMPRESSION(S) / ED DIAGNOSES   Final diagnoses:  Seizure-like activity (HCC)     Rx / DC Orders   ED Discharge Orders     None        Note:  This document was prepared using Dragon voice recognition software and may include unintentional dictation errors.   Loleta Rose, MD 03/31/23 [phone]

## 2023-03-31 NOTE — ED Notes (Signed)
Pt A&O x4, no obvious distress noted, respirations regular/unlabored. Pt verbalizes understanding of discharge instructions. Pt able to ambulate from ED independently.   

## 2023-03-31 NOTE — Discharge Instructions (Signed)
You have been seen in the emergency department today for a seizure.  Your workup today including labs are within normal limits.  Please follow up with your doctor/neurologist as soon as possible regarding today's emergency department visit and your likely seizure.  As we discussed, we thought about starting you back on antiepileptic medication, but you should be able to follow-up right away with your neurologist.  When you call this morning, explain the situation and see if your doctor or one of their colleagues will call you back to discuss whether you should restart your Keppra, Vimpat, or both.  As we have discussed it is very important that you do not drive until you have been seen and cleared by your neurologist.  Please drink plenty of fluids, get plenty of sleep and avoid any alcohol or drug use Please return to the emergency department if you have any further seizures which do not respond to medications, or for any other symptoms per se concerning for yourself.

## 2023-04-05 ENCOUNTER — Ambulatory Visit: Payer: Medicare Other | Admitting: Physical Therapy

## 2023-04-05 ENCOUNTER — Encounter: Payer: BC Managed Care – PPO | Admitting: Speech Pathology

## 2023-04-05 ENCOUNTER — Ambulatory Visit: Payer: Medicare Other

## 2023-04-05 DIAGNOSIS — R2681 Unsteadiness on feet: Secondary | ICD-10-CM

## 2023-04-05 DIAGNOSIS — R278 Other lack of coordination: Secondary | ICD-10-CM

## 2023-04-05 DIAGNOSIS — R262 Difficulty in walking, not elsewhere classified: Secondary | ICD-10-CM

## 2023-04-05 DIAGNOSIS — H539 Unspecified visual disturbance: Secondary | ICD-10-CM

## 2023-04-05 DIAGNOSIS — M6281 Muscle weakness (generalized): Secondary | ICD-10-CM | POA: Diagnosis not present

## 2023-04-05 NOTE — Therapy (Signed)
OUTPATIENT PHYSICAL THERAPY NEURO TREATMENT   Patient Name: Mark Durham MRN: [id number] DOB:21-Feb-1951, 72 y.o., male Today's Date: 04/05/2023   PCP: [facility] REFERRING PROVIDER: Hope Pigeon MD   END OF SESSION:  PT End of Session - 04/05/23 1546     Visit Number 32    Number of Visits 41    Date for PT Re-Evaluation 04/11/23    Authorization Type [employer] COMM PRO    Authorization Time Period 11/03/22-01/26/23    PT Start Time 1541    PT Stop Time 1620    PT Time Calculation (min) 39 min    Equipment Utilized During Treatment Gait belt    Activity Tolerance Patient tolerated treatment well    Behavior During Therapy WFL for tasks assessed/performed               Past Medical History:  Diagnosis Date   CVA (cerebral vascular accident) (HCC)    Hypertension    Past Surgical History:  Procedure Laterality Date   TOTAL HIP ARTHROPLASTY Right    Patient Active Problem List   Diagnosis Date Noted   Sepsis due to pneumonia (HCC) 01/09/2023   Severe sepsis (HCC) 01/09/2023   UTI (urinary tract infection) 01/09/2023   HTN (hypertension) 01/09/2023   HLD (hyperlipidemia) 01/09/2023   Hypokalemia 01/09/2023   Tobacco use 01/09/2023   Obesity (BMI 30-39.9) 01/09/2023   CAP (community acquired pneumonia) 01/09/2023   Hypomagnesemia 01/09/2023   Hypophosphatemia 01/09/2023   TIA (transient ischemic attack) 01/05/2023   GERD (gastroesophageal reflux disease) 02/26/2021   S/P hip replacement, right 03/04/2020   Primary osteoarthritis of right hip 01/24/2020   Chronic pain of right knee 11/23/2019   Chronic right hip pain 11/23/2019   Osteoarthritis of knee 11/13/2018   History of adenomatous polyp of colon 02/14/2018   History of Barrett's esophagus 02/14/2018    ONSET DATE: January 2024  REFERRING DIAG: weakness due to stroke   THERAPY DIAG:  Muscle weakness (generalized)  Other lack of coordination  Difficulty in walking, not elsewhere  classified  Unsteadiness on feet  Vision disturbance following CVA (cerebrovascular accident)  Rationale for Evaluation and Treatment: Rehabilitation  SUBJECTIVE:                                                                                                                                                                                             SUBJECTIVE STATEMENT:  Pt reports doing well today. States that he had a seizure since last seen by this PT on 9/19. Per chart pt was brought to [facility] via ambulance. Pt was assessed by ED staff.  CT head showed no acute findings. No acute ischemia on EKG; and the patient is having no chest pain. Pt was released from ED the following day. No residual deficits.   Pt accompanied by: significant other  PERTINENT HISTORY: Patient presents to physical therapy for weakness s/p stroke . 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 Per documentation he had a secondary stroke during the hospital stay as well as three seizures. Patient was admitted to [facility] Acute inpatient rehabilitation from 08/10/22-08/31/22 where he was discharged home. Patient was discharged from Home health on 10/28/22 (PT, OT, and ST). PMH includes GERD, OA, R knee/hip pain, Barrett's esophagus. Is using a walker, still drags right foot. Needs assistance for transfers but can bath and dress self.   PAIN:  Are you having pain? No  PRECAUTIONS: Fall  WEIGHT BEARING RESTRICTIONS: No  FALLS: Has patient fallen in last 6 months? No    PATIENT GOALS: walking with less AD, get back to mowing the yard, gardening, grilling     OBJECTIVE:   TODAY'S TREATMENT: DATE: 04/05/23  Nustep level 0-3, BUE and BLE x 4.5 min and BLE only x 2.5 min   Gait without resistance x 116ft. Cues from PT for improved heel contact on the LLE when fatigued. Additional gait training without AD x 44ft with mild foot drage on BLE for last 20 ft.    Static stance with 1 LE  on 4inch step 3 x 10 sec bil  Reciprocal foot tap on 4 inch step x 10 bil  Lateral foot tap on 4inch step x 15 bil, min assist while standing on LLE, cue to hip/core weakness   Seated deep core activation: RTB stabilization 30 sec bil. Lateral trunk rotation to neutral x 10 bil. UE held at midline throughout.   Forward Step up/down 4inch step x 6 RLE and x 6 LLE. Cues for step length with standing on the LLE due to hip weakness.    PATIENT EDUCATION: Education details: goals, POC, HEP Pt educated throughout session about proper posture and technique with exercises. Improved exercise technique, movement at target joints, use of target muscles after min to mod verbal, visual, tactile cues.  Person educated: Patient and Spouse Education method: Explanation, Demonstration, Tactile cues, and Verbal cues Education comprehension: verbalized understanding, returned demonstration, verbal cues required, and tactile cues required  HOME EXERCISE PROGRAM:  Access Code: [email or web address] URL: [email or web address] Date: 12/23/2022 Prepared by: Grier Rocher  Exercises - Standing Tandem Balance with Counter Support  - 1 x daily - 5 x weekly - 3 sets - 4 reps - 10 hold - Standing Single Leg Stance with Counter Support  - 1 x daily - 5 x weekly - 3 sets - 4 reps - 10 hold  Access Code: [email or web address] URL: [email or web address] Date: 11/03/2022 Prepared by: Precious Bard  Exercises - Seated March  - 1 x daily - 7 x weekly - 2 sets - 10 reps - 5 hold - Seated Long Arc Quad  - 1 x daily - 7 x weekly - 2 sets - 10 reps - 5 hold - Seated Heel Toe Raises  - 1 x daily - 7 x weekly - 2 sets - 10 reps - 5 hold - Seated Hip Abduction  - 1 x daily - 7 x weekly - 2 sets - 10 reps - 5 hold - Seated Isometric Hip Adduction with Ball  - 1 x daily - 7 x weekly - 2 sets - 10  reps - 5 hold  GOALS: Goals reviewed with patient? Yes  SHORT TERM GOALS: Target date: 01/17/2023  Patient will be  independent in home exercise program to improve strength/mobility for better functional independence with ADLs. Baseline:4/24: HEP given  Goal status: MET    LONG TERM GOALS: Target date: 04/11/2023(corrected)  Patient will increase FOTO score to equal to or greater than  56 to demonstrate statistically significant improvement in mobility and quality of life.   Baseline: 40 6/3: 41 7/8:54 7/23: 52 9/3; 60  Goal status: MET  2.  Patient (> 36 years old) will complete five times sit to stand test in < 15 seconds indicating an increased LE strength and improved balance. Baseline: 4/24: 38.37 with BUE support 6/3: 12.02 sec with UE support 7/8: 18.12 sec without UE assist 7/23: 16.93 without UE assist  9/3: performed with BUE support 14.30 sec without UE support 14.47 sec  Goal status: MET  3.  Patient will increase 10 meter walk test to >1.96m/s as to improve gait speed for better community ambulation and to reduce fall risk. Baseline: 4/24: 19.2 seconds with RW; dragging of R foot  6/3: 17.175 with no AD and CGA from PT.  7/8: 14.6 sec without AD (0.65m/s) 7/23: 13.21sec without AD (0.39m/s)  9/3 with use of RW 14.99 sec. 13.3 sec without AD.   Goal status: IN PROGRESS  4.  Patient will increase Berg Balance score to > 45points to demonstrate decreased fall risk during functional activities. Baseline: 4/24: see next session 5/2: 30/56 6/3: 31/56 7/8 42/56  7/23:41/56 9/10:46/56  Goal status: MET In progress    5.  Patient will increase 2 min walk test  >300 ft as to demonstrate reduced fall risk and improved dynamic gait balance for better safety with community/home ambulation.   Baseline: 201 7/23: 245 with min assist due to heavy R foot drag for last 60ft  9/3 252 ft no UE support  271ft with UE support on RW Goal status: REVISED in progress,      ASSESSMENT:  CLINICAL IMPRESSION:   Patient presented to PT ready to participate. Hospitalization due to seizure  activity on 9/19, but no residual deficits. PT treatment focused on BLE strength and endurance as well as improved step length and posture. Pt noted to have poor step length and posture while fatigues in BLE on this day. Pt will continue to benefit from skilled PT to address strength, balance, coordination and visual deficits to improve QoL and return to PLOF.        OBJECTIVE IMPAIRMENTS: Abnormal gait, cardiopulmonary status limiting activity, decreased activity tolerance, decreased balance, decreased cognition, decreased coordination, decreased endurance, decreased knowledge of condition, decreased knowledge of use of DME, decreased mobility, difficulty walking, decreased strength, decreased safety awareness, impaired perceived functional ability, impaired flexibility, impaired UE functional use, impaired vision/preception, improper body mechanics, and postural dysfunction.   ACTIVITY LIMITATIONS: carrying, lifting, bending, sitting, standing, squatting, sleeping, stairs, transfers, bed mobility, bathing, toileting, reach over head, hygiene/grooming, locomotion level, and caring for others  PARTICIPATION LIMITATIONS: meal prep, cleaning, laundry, medication management, personal finances, interpersonal relationship, driving, shopping, community activity, and yard work  PERSONAL FACTORS: Age, Past/current experiences, Time since onset of injury/illness/exacerbation, Transportation, and 3+ comorbidities: GERD, OA, R knee/hip pain, Barrett's esophagus  are also affecting patient's functional outcome.   REHAB POTENTIAL: Good  CLINICAL DECISION MAKING: Evolving/moderate complexity  EVALUATION COMPLEXITY: Moderate  PLAN:  PT FREQUENCY: 2x/week  PT DURATION: 12 weeks  PLANNED INTERVENTIONS: Therapeutic  exercises, Therapeutic activity, Neuromuscular re-education, Balance training, Gait training, Patient/Family education, Self Care, Joint mobilization, Joint manipulation, Stair training, Vestibular  training, Canalith repositioning, Visual/preceptual remediation/compensation, Orthotic/Fit training, Electrical stimulation, Wheelchair mobility training, Spinal mobilization, Cryotherapy, Moist heat, Splintting, Taping, Ultrasound, Ionotophoresis 4mg /ml Dexamethasone, Manual therapy, and Re-evaluation  PLAN FOR NEXT SESSION:    Endurance and forced attention to the R side  Dynamic balance training.  HEP adjustment.    Grier Rocher PT, DPT  Physical Therapist - [place]  [facility]  3:47 PM 04/05/23

## 2023-04-07 ENCOUNTER — Encounter: Payer: BC Managed Care – PPO | Admitting: Speech Pathology

## 2023-04-07 ENCOUNTER — Ambulatory Visit: Payer: Medicare Other

## 2023-04-07 ENCOUNTER — Ambulatory Visit: Payer: BC Managed Care – PPO | Admitting: Physical Therapy

## 2023-04-12 ENCOUNTER — Encounter: Payer: BC Managed Care – PPO | Admitting: Speech Pathology

## 2023-04-12 ENCOUNTER — Ambulatory Visit: Payer: Medicare Other | Attending: *Deleted | Admitting: Physical Therapy

## 2023-04-12 ENCOUNTER — Ambulatory Visit: Payer: Medicare Other

## 2023-04-12 DIAGNOSIS — H539 Unspecified visual disturbance: Secondary | ICD-10-CM | POA: Diagnosis present

## 2023-04-12 DIAGNOSIS — R262 Difficulty in walking, not elsewhere classified: Secondary | ICD-10-CM | POA: Insufficient documentation

## 2023-04-12 DIAGNOSIS — R278 Other lack of coordination: Secondary | ICD-10-CM | POA: Insufficient documentation

## 2023-04-12 DIAGNOSIS — R2681 Unsteadiness on feet: Secondary | ICD-10-CM | POA: Insufficient documentation

## 2023-04-12 DIAGNOSIS — M6281 Muscle weakness (generalized): Secondary | ICD-10-CM | POA: Insufficient documentation

## 2023-04-12 DIAGNOSIS — I69398 Other sequelae of cerebral infarction: Secondary | ICD-10-CM | POA: Insufficient documentation

## 2023-04-12 NOTE — Therapy (Unsigned)
OUTPATIENT PHYSICAL THERAPY NEURO TREATMENT   Patient Name: [facility] MRN: [id number] DOB:May 18, 1951, 72 y.o., male Today's Date: 04/12/2023   PCP: [facility] REFERRING PROVIDER: Hope Pigeon MD   END OF SESSION:  PT End of Session - 04/12/23 1550     Visit Number 33    Number of Visits 41    Date for PT Re-Evaluation 05/10/23    Authorization Type [employer] COMM PRO    Authorization Time Period 11/03/22-01/26/23    PT Start Time 1535    PT Stop Time 1610    PT Time Calculation (min) 35 min    Equipment Utilized During Treatment Gait belt    Activity Tolerance Patient tolerated treatment well    Behavior During Therapy WFL for tasks assessed/performed               Past Medical History:  Diagnosis Date   CVA (cerebral vascular accident) (HCC)    Hypertension    Past Surgical History:  Procedure Laterality Date   TOTAL HIP ARTHROPLASTY Right    Patient Active Problem List   Diagnosis Date Noted   Sepsis due to pneumonia (HCC) 01/09/2023   Severe sepsis (HCC) 01/09/2023   UTI (urinary tract infection) 01/09/2023   HTN (hypertension) 01/09/2023   HLD (hyperlipidemia) 01/09/2023   Hypokalemia 01/09/2023   Tobacco use 01/09/2023   Obesity (BMI 30-39.9) 01/09/2023   CAP (community acquired pneumonia) 01/09/2023   Hypomagnesemia 01/09/2023   Hypophosphatemia 01/09/2023   TIA (transient ischemic attack) 01/05/2023   GERD (gastroesophageal reflux disease) 02/26/2021   S/P hip replacement, right 03/04/2020   Primary osteoarthritis of right hip 01/24/2020   Chronic pain of right knee 11/23/2019   Chronic right hip pain 11/23/2019   Osteoarthritis of knee 11/13/2018   History of adenomatous polyp of colon 02/14/2018   History of Barrett's esophagus 02/14/2018    ONSET DATE: January 2024  REFERRING DIAG: weakness due to stroke   THERAPY DIAG:  Muscle weakness (generalized)  Other lack of coordination  Difficulty in walking, not elsewhere  classified  Unsteadiness on feet  Vision disturbance following CVA (cerebrovascular accident)  Rationale for Evaluation and Treatment: Rehabilitation  SUBJECTIVE:                                                                                                                                                                                             SUBJECTIVE STATEMENT:  Pt reports doing well today. States that he had a seizure since last seen by this PT on 9/19. Per chart pt was brought to [facility] ambulance. Pt was assessed by ED staff.  CT head showed no acute findings. No acute ischemia on EKG; and the patient is having no chest pain. Pt was released from ED the following day. No residual deficits.   Pt accompanied by: significant other  PERTINENT HISTORY: Patient presents to physical therapy for weakness s/p stroke . 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 Per documentation he had a secondary stroke during the hospital stay as well as three seizures. [facility] from [facility]/10/22-08/31/22 where he was discharged home. Patient was discharged from Home health on 10/28/22 (PT, OT, and ST). PMH includes GERD, OA, R knee/hip pain, Barrett's esophagus. Is using a walker, still drags right foot. Needs assistance for transfers but can bath and dress self.   PAIN:  Are you having pain? No  PRECAUTIONS: Fall  WEIGHT BEARING RESTRICTIONS: No  FALLS: Has patient fallen in last 6 months? No    PATIENT GOALS: walking with less AD, get back to mowing the yard, gardening, grilling     OBJECTIVE:   TODAY'S TREATMENT: DATE: 04/12/23  Nustep level 2-4, BUE and BLE x 6 min   Gait without resistance x 116ft. Cues from PT for improved heel contact on the LLE when fatigued and in turns, but pt was noted to correct foot drag with decreased cues . Additional gait training without AD x 46ft with mild foot drage on  BLE for last 20 ft.    Stepping over bolsters 1 foot in each space x for 1 lap in parallel bars; performed x 3 laps lead with RLE, x 3 laps leading with LLE.  Reciprocal gait over bolsters on floor x 4 laps  Standing therex to advance HEP.  Hip abduction x 10  Hip extension x 10  HS curl x 10 SLS for 10 sec hold. Each performed at rail with BUE supported. Cues for posture and full ROM.     PATIENT EDUCATION: Education details: goals, POC, HEP Pt educated throughout session about proper posture and technique with exercises. Improved exercise technique, movement at target joints, use of target muscles after min to mod verbal, visual, tactile cues.  Person educated: Patient and Spouse Education method: Explanation, Demonstration, Tactile cues, and Verbal cues Education comprehension: verbalized understanding, returned demonstration, verbal cues required, and tactile cues required  HOME EXERCISE PROGRAM:  Access Code: [email or web address] URL: [email or web address] Date: 12/23/2022 Prepared by: [facility]  Exercises - Standing Tandem Balance with Counter Support  - 1 x daily - 5 x weekly - 3 sets - 4 reps - 10 hold - Standing Single Leg Stance with Counter Support  - 1 x daily - 5 x weekly - 3 sets - 4 reps - 10 hold  Access Code: [email or web address] URL: [email or web address] Date: 11/03/2022 Prepared by: [facility]  Exercises - Seated March  - 1 x daily - 7 x weekly - 2 sets - 10 reps - 5 hold - Seated Long Arc Quad  - 1 x daily - 7 x weekly - 2 sets - 10 reps - 5 hold - Seated Heel Toe Raises  - 1 x daily - 7 x weekly - 2 sets - 10 reps - 5 hold - Seated Hip Abduction  - 1 x daily - 7 x weekly - 2 sets - 10 reps - 5 hold - Seated Isometric Hip Adduction with Ball  - 1 x daily - 7 x weekly - 2 sets - 10 reps - 5 hold  GOALS: Goals reviewed  with patient? Yes  SHORT TERM GOALS: Target date: 01/17/2023  Patient will be independent in home exercise program to improve  strength/mobility for better functional independence with ADLs. Baseline:4/24: HEP given  Goal status: MET    LONG TERM GOALS: Target date: 04/11/2023(corrected)  Patient will increase FOTO score to equal to or greater than  56 to demonstrate statistically significant improvement in mobility and quality of life.   Baseline: 40 6/3: 41 7/8:54 7/23: 52 9/3; 60  Goal status: MET  2.  Patient (> 21 years old) will complete five times sit to stand test in < 15 seconds indicating an increased LE strength and improved balance. Baseline: 4/24: 38.37 with BUE support 6/3: 12.02 sec with UE support 7/8: 18.12 sec without UE assist 7/23: 16.93 without UE assist  9/3: performed with BUE support 14.30 sec without UE support 14.47 sec  Goal status: MET  3.  Patient will increase 10 meter walk test to >1.63m/s as to improve gait speed for better community ambulation and to reduce fall risk. Baseline: 4/24: 19.2 seconds with RW; dragging of R foot  6/3: 17.175 with no AD and CGA from PT.  7/8: 14.6 sec without AD (0.61m/s) 7/23: 13.21sec without AD (0.49m/s)  9/3 with use of RW 14.99 sec. 13.3 sec without AD.   Goal status: IN PROGRESS  4.  Patient will increase Berg Balance score to > 45points to demonstrate decreased fall risk during functional activities. Baseline: 4/24: see next session 5/2: 30/56 6/3: 31/56 7/8 42/56  7/23:41/56 9/10:46/56  Goal status: MET In progress    5.  Patient will increase 2 min walk test  >300 ft as to demonstrate reduced fall risk and improved dynamic gait balance for better safety with community/home ambulation.   Baseline: 201 7/23: 245 with min assist due to heavy R foot drag for last 3ft  9/3 252 ft no UE support  276ft with UE support on RW Goal status: REVISED in progress,      ASSESSMENT:  CLINICAL IMPRESSION:   Patient presented to PT ready to participate. Pt reports that is doing well and feels more independent with daily mobility. PT  expanded HEP to including BLE strengthening and ROM in standing. Noted to still have mild R side inattention, but able to correct with reduced cues on this day. Pt will continue to benefit from skilled PT to address strength, balance, coordination and visual deficits to improve QoL and return to PLOF.        OBJECTIVE IMPAIRMENTS: Abnormal gait, cardiopulmonary status limiting activity, decreased activity tolerance, decreased balance, decreased cognition, decreased coordination, decreased endurance, decreased knowledge of condition, decreased knowledge of use of DME, decreased mobility, difficulty walking, decreased strength, decreased safety awareness, impaired perceived functional ability, impaired flexibility, impaired UE functional use, impaired vision/preception, improper body mechanics, and postural dysfunction.   ACTIVITY LIMITATIONS: carrying, lifting, bending, sitting, standing, squatting, sleeping, stairs, transfers, bed mobility, bathing, toileting, reach over head, hygiene/grooming, locomotion level, and caring for others  PARTICIPATION LIMITATIONS: meal prep, cleaning, laundry, medication management, personal finances, interpersonal relationship, driving, shopping, community activity, and yard work  PERSONAL FACTORS: Age, Past/current experiences, Time since onset of injury/illness/exacerbation, Transportation, and 3+ comorbidities: GERD, OA, R knee/hip pain, Barrett's esophagus  are also affecting patient's functional outcome.   REHAB POTENTIAL: Good  CLINICAL DECISION MAKING: Evolving/moderate complexity  EVALUATION COMPLEXITY: Moderate  PLAN:  PT FREQUENCY: 2x/week  PT DURATION: 12 weeks  PLANNED INTERVENTIONS: Therapeutic exercises, Therapeutic activity, Neuromuscular re-education, Balance training, Gait training,  Patient/Family education, Self Care, Joint mobilization, Joint manipulation, Stair training, Vestibular training, Canalith repositioning, Visual/preceptual  remediation/compensation, Orthotic/Fit training, Electrical stimulation, Wheelchair mobility training, Spinal mobilization, Cryotherapy, Moist heat, Splintting, Taping, Ultrasound, Ionotophoresis 4mg /ml Dexamethasone, Manual therapy, and Re-evaluation  PLAN FOR NEXT SESSION:    LTG assessment for d/c    [facility] PT, DPT  Physical Therapist - [facility]  3:56 PM 04/12/23

## 2023-04-14 ENCOUNTER — Ambulatory Visit: Payer: Self-pay

## 2023-04-14 ENCOUNTER — Ambulatory Visit: Payer: BC Managed Care – PPO | Admitting: Physical Therapy

## 2023-04-14 ENCOUNTER — Encounter: Payer: BC Managed Care – PPO | Admitting: Speech Pathology

## 2023-04-19 ENCOUNTER — Encounter: Payer: BC Managed Care – PPO | Admitting: Speech Pathology

## 2023-04-19 ENCOUNTER — Ambulatory Visit: Payer: Self-pay

## 2023-04-19 ENCOUNTER — Ambulatory Visit: Payer: Medicare Other | Admitting: Physical Therapy

## 2023-04-19 DIAGNOSIS — M6281 Muscle weakness (generalized): Secondary | ICD-10-CM

## 2023-04-19 DIAGNOSIS — R262 Difficulty in walking, not elsewhere classified: Secondary | ICD-10-CM

## 2023-04-19 DIAGNOSIS — R278 Other lack of coordination: Secondary | ICD-10-CM

## 2023-04-19 DIAGNOSIS — R2681 Unsteadiness on feet: Secondary | ICD-10-CM

## 2023-04-19 NOTE — Therapy (Signed)
OUTPATIENT PHYSICAL THERAPY NEURO TREATMENT/ Discharge Summary    Patient Name: BLAYDE BOAT MRN: [id number] DOB:13-Aug-1950, 72 y.o., male Today's Date: 04/19/2023   [facility]P: [facility] REFERRING PROVIDER: Hope Pigeon MD   END OF SESSION:  PT End of Session - 04/19/23 1539     Visit Number 34    Number of Visits 41    Date for PT Re-Evaluation 05/10/23    Authorization Type [employer] COMM PRO    Authorization Time Period 11/03/22-01/26/23    PT Start Time 1534    PT Stop Time 1615    PT Time Calculation (min) 41 min    Equipment Utilized During Treatment Gait belt    Activity Tolerance Patient tolerated treatment well    Behavior During Therapy WFL for tasks assessed/performed               Past Medical History:  Diagnosis Date   CVA (cerebral vascular accident) (HCC)    Hypertension    Past Surgical History:  Procedure Laterality Date   TOTAL HIP ARTHROPLASTY Right    Patient Active Problem List   Diagnosis Date Noted   Sepsis due to pneumonia (HCC) 01/09/2023   Severe sepsis (HCC) 01/09/2023   UTI (urinary tract infection) 01/09/2023   HTN (hypertension) 01/09/2023   HLD (hyperlipidemia) 01/09/2023   Hypokalemia 01/09/2023   Tobacco use 01/09/2023   Obesity (BMI 30-39.9) 01/09/2023   CAP (community acquired pneumonia) 01/09/2023   Hypomagnesemia 01/09/2023   Hypophosphatemia 01/09/2023   TIA (transient ischemic attack) 01/05/2023   GERD (gastroesophageal reflux disease) 02/26/2021   S/P hip replacement, right 03/04/2020   Primary osteoarthritis of right hip 01/24/2020   Chronic pain of right knee 11/23/2019   Chronic right hip pain 11/23/2019   Osteoarthritis of knee 11/13/2018   History of adenomatous polyp of colon 02/14/2018   History of Barrett's esophagus 02/14/2018    ONSET DATE: January 2024  REFERRING DIAG: weakness due to stroke   THERAPY DIAG:  Muscle weakness (generalized)  Other lack of coordination  Difficulty in  walking, not elsewhere classified  Unsteadiness on feet  Rationale for Evaluation and Treatment: Rehabilitation  SUBJECTIVE:                                                                                                                                                                                             SUBJECTIVE STATEMENT: Pt reports doing well today. States that he feels ready to dc from PT, but does report that he knows he can still improve. Pt continues to report that he has reduced use of RW in home and in  community without increased falls in the last several weeks.   Pt accompanied by: significant other  PERTINENT HISTORY: Patient presents to physical therapy for weakness s/p stroke . 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 Per documentation he had a secondary stroke during the hospital stay as well as three seizures. Patient was admitted to [facility] rehabilitation from 08/10/22-08/31/22 where he was discharged home. Patient was discharged from Home health on 10/28/22 (PT, OT, and ST). PMH includes GERD, OA, R knee/hip pain, Barrett's esophagus. Is using a walker, still drags right foot. Needs assistance for transfers but can bath and dress self.   PAIN:  Are you having pain? No  PRECAUTIONS: Fall  WEIGHT BEARING RESTRICTIONS: No  FALLS: Has patient fallen in last 6 months? No    PATIENT GOALS: walking with less AD, get back to mowing the yard, gardening, grilling     OBJECTIVE:   TODAY'S TREATMENT: DATE: 04/19/23  Nustep level 2-4, BUE and BLE x 6 min  PT instructed pt in assessment of LTG to measure progress as pt has reached the end of available visits approved by insurance. See below for details    PATIENT EDUCATION: Education details: goals, POC, HEP Pt educated throughout session about proper posture and technique with exercises. Improved exercise technique, movement at target joints, use of target  muscles after min to mod verbal, visual, tactile cues.  Person educated: Patient and Spouse Education method: Explanation, Demonstration, Tactile cues, and Verbal cues Education comprehension: verbalized understanding, returned demonstration, verbal cues required, and tactile cues required  HOME EXERCISE PROGRAM:  Access Code: [email or web address] URL: [email or web address] Date: 12/23/2022 Prepared by: Grier Rocher  Exercises - Standing Tandem Balance with Counter Support  - 1 x daily - 5 x weekly - 3 sets - 4 reps - 10 hold - Standing Single Leg Stance with Counter Support  - 1 x daily - 5 x weekly - 3 sets - 4 reps - 10 hold  Access Code: [email or web address] URL: [email or web address] Date: 11/03/2022 Prepared by: Precious Bard  Exercises - Seated March  - 1 x daily - 7 x weekly - 2 sets - 10 reps - 5 hold - Seated Long Arc Quad  - 1 x daily - 7 x weekly - 2 sets - 10 reps - 5 hold - Seated Heel Toe Raises  - 1 x daily - 7 x weekly - 2 sets - 10 reps - 5 hold - Seated Hip Abduction  - 1 x daily - 7 x weekly - 2 sets - 10 reps - 5 hold - Seated Isometric Hip Adduction with Ball  - 1 x daily - 7 x weekly - 2 sets - 10 reps - 5 hold  GOALS: Goals reviewed with patient? Yes  SHORT TERM GOALS: Target date: 01/17/2023  Patient will be independent in home exercise program to improve strength/mobility for better functional independence with ADLs. Baseline:4/24: HEP given  Goal status: MET    LONG TERM GOALS: Target date: 04/11/2023(corrected)  Patient will increase FOTO score to equal to or greater than  56 to demonstrate statistically significant improvement in mobility and quality of life.   Baseline: 40 6/3: 41 7/8:54 7/23: 52 9/3; 60  10/8:67 Goal status: MET  2.  Patient (> 31 years old) will complete five times sit to stand test in < 15 seconds indicating an increased LE strength and improved balance. Baseline: 4/24: 38.37 with BUE support 6/3: 12.02 sec with  UE support  7/8: 18.12 sec without UE assist 7/23: 16.93 without UE assist  9/3: performed with BUE support 14.30 sec without UE support 14.47 sec  10/8: 13.04 sec no UE support  Goal status: MET  3.  Patient will increase 10 meter walk test to >1.45m/s as to improve gait speed for better community ambulation and to reduce fall risk. Baseline: 4/24: 19.2 seconds with RW; dragging of R foot  6/3: 17.175 with no AD and CGA from PT.  7/8: 14.6 sec without AD (0.30m/s) 7/23: 13.21sec without AD (0.18m/s)  9/3 with use of RW 14.99 sec. 13.3 sec without AD.  10/8: normal no AD: 14.149.44 sec.  Fast no AD : 9.44  Goal status: MET  4.  Patient will increase Berg Balance score to > 45points to demonstrate decreased fall risk during functional activities. Baseline: 4/24: see next session 5/2: 30/56 6/3: 31/56 7/8 42/56  7/23:41/56 9/10:46/56 10/8: 48 Goal status: MET     5.  Patient will increase 2 min walk test  >300 ft as to demonstrate reduced fall risk and improved dynamic gait balance for better safety with community/home ambulation.   Baseline: 201 7/23: 245 with min assist due to heavy R foot drag for last 30ft  9/3 252 ft no UE support  268ft with UE support on RW  10/8: 235ft. Noted to have increased foot drag on the RLE with fatigue requiring cues for heel contact, but no additional assist from PT.   Goal status: IN PROGRESS in progress,      ASSESSMENT:  CLINICAL IMPRESSION:   Patient presented to PT ready to participate. Pt demonstrates improved function with increased gait speed, improved speed on 5x STS< increased Berg balance scale to 48, increased 2 min walk test up to 271ft, and improved Foto to 67 on this day. Pt has met 4 of 5 LTG indicating improved function, but pt still remains at elevated fall risk evidenced by Sharlene Motts, and inability to perform 6 minute walk test.  Pt has reached end of approved PT visits approved by insurance, but would continue to benefit from  skilled PT to address strength, balance, coordination and visual deficits to improve QoL and return to PLOF. PT provided pt with [facility]-bono [facility] clinic referral form to provide to MD, if deemed appropriate.        OBJECTIVE IMPAIRMENTS: Abnormal gait, cardiopulmonary status limiting activity, decreased activity tolerance, decreased balance, decreased cognition, decreased coordination, decreased endurance, decreased knowledge of condition, decreased knowledge of use of DME, decreased mobility, difficulty walking, decreased strength, decreased safety awareness, impaired perceived functional ability, impaired flexibility, impaired UE functional use, impaired vision/preception, improper body mechanics, and postural dysfunction.   ACTIVITY LIMITATIONS: carrying, lifting, bending, sitting, standing, squatting, sleeping, stairs, transfers, bed mobility, bathing, toileting, reach over head, hygiene/grooming, locomotion level, and caring for others  PARTICIPATION LIMITATIONS: meal prep, cleaning, laundry, medication management, personal finances, interpersonal relationship, driving, shopping, community activity, and yard work  PERSONAL FACTORS: Age, Past/current experiences, Time since onset of injury/illness/exacerbation, Transportation, and 3+ comorbidities: GERD, OA, R knee/hip pain, Barrett's esophagus  are also affecting patient's functional outcome.   REHAB POTENTIAL: Good  CLINICAL DECISION MAKING: Evolving/moderate complexity  EVALUATION COMPLEXITY: Moderate  PLAN:  PT FREQUENCY: 2x/week  PT DURATION: 12 weeks  PLANNED INTERVENTIONS: Therapeutic exercises, Therapeutic activity, Neuromuscular re-education, Balance training, Gait training, Patient/Family education, Self Care, Joint mobilization, Joint manipulation, Stair training, Vestibular training, Canalith repositioning, Visual/preceptual remediation/compensation, Orthotic/Fit training, Electrical stimulation, Wheelchair mobility training,  Spinal mobilization, Cryotherapy, Moist heat,  Splintting, Taping, Ultrasound, Ionotophoresis 4mg /ml Dexamethasone, Manual therapy, and Re-evaluation  PLAN FOR NEXT SESSION:    D/c'ed from PT treatment.    Grier Rocher PT, DPT  Physical Therapist -   [facility]  3:48 PM 04/19/23

## 2023-04-21 ENCOUNTER — Ambulatory Visit: Payer: Medicare Other

## 2023-04-21 ENCOUNTER — Encounter: Payer: BC Managed Care – PPO | Admitting: Speech Pathology

## 2023-04-21 ENCOUNTER — Ambulatory Visit: Payer: BC Managed Care – PPO | Admitting: Physical Therapy

## 2023-04-26 ENCOUNTER — Ambulatory Visit: Payer: BC Managed Care – PPO | Admitting: Physical Therapy

## 2023-04-26 ENCOUNTER — Ambulatory Visit: Payer: Self-pay

## 2023-04-26 ENCOUNTER — Encounter: Payer: BC Managed Care – PPO | Admitting: Speech Pathology

## 2023-04-28 ENCOUNTER — Encounter: Payer: BC Managed Care – PPO | Admitting: Speech Pathology

## 2023-04-28 ENCOUNTER — Ambulatory Visit: Payer: Self-pay

## 2023-04-28 ENCOUNTER — Ambulatory Visit: Payer: BC Managed Care – PPO | Admitting: Physical Therapy

## 2023-05-03 ENCOUNTER — Ambulatory Visit: Payer: BC Managed Care – PPO | Admitting: Physical Therapy

## 2023-05-05 ENCOUNTER — Ambulatory Visit: Payer: BC Managed Care – PPO | Admitting: Physical Therapy

## 2023-05-05 DIAGNOSIS — G40909 Epilepsy, unspecified, not intractable, without status epilepticus: Secondary | ICD-10-CM

## 2023-05-10 ENCOUNTER — Ambulatory Visit: Payer: BC Managed Care – PPO | Admitting: Physical Therapy

## 2023-05-12 ENCOUNTER — Ambulatory Visit: Payer: BC Managed Care – PPO | Admitting: Physical Therapy

## 2023-05-17 ENCOUNTER — Ambulatory Visit: Payer: BC Managed Care – PPO | Admitting: Physical Therapy

## 2023-05-19 ENCOUNTER — Ambulatory Visit: Payer: BC Managed Care – PPO | Admitting: Physical Therapy

## 2023-05-24 ENCOUNTER — Ambulatory Visit: Payer: BC Managed Care – PPO | Admitting: Physical Therapy

## 2023-05-26 ENCOUNTER — Ambulatory Visit: Payer: BC Managed Care – PPO | Admitting: Physical Therapy

## 2023-05-31 ENCOUNTER — Ambulatory Visit: Payer: BC Managed Care – PPO | Admitting: Physical Therapy

## 2023-06-02 ENCOUNTER — Ambulatory Visit: Payer: BC Managed Care – PPO | Admitting: Physical Therapy

## 2023-06-07 ENCOUNTER — Ambulatory Visit: Payer: BC Managed Care – PPO | Admitting: Physical Therapy

## 2023-06-14 ENCOUNTER — Ambulatory Visit: Payer: BC Managed Care – PPO | Admitting: Physical Therapy

## 2023-06-16 ENCOUNTER — Ambulatory Visit: Payer: BC Managed Care – PPO | Admitting: Physical Therapy

## 2023-06-21 ENCOUNTER — Ambulatory Visit: Payer: BC Managed Care – PPO | Admitting: Physical Therapy

## 2023-06-23 ENCOUNTER — Ambulatory Visit: Payer: BC Managed Care – PPO | Admitting: Physical Therapy

## 2023-11-04 ENCOUNTER — Other Ambulatory Visit: Payer: Self-pay

## 2023-11-04 ENCOUNTER — Observation Stay
Admission: EM | Admit: 2023-11-04 | Discharge: 2023-11-06 | Disposition: A | Discharge status: Home / Self Care | Attending: Student | Admitting: Student

## 2023-11-04 ENCOUNTER — Emergency Department

## 2023-11-04 ENCOUNTER — Encounter: Payer: Self-pay | Admitting: Emergency Medicine

## 2023-11-04 DIAGNOSIS — G43909 Migraine, unspecified, not intractable, without status migrainosus: Secondary | ICD-10-CM | POA: Diagnosis not present

## 2023-11-04 DIAGNOSIS — R4701 Aphasia: Principal | ICD-10-CM

## 2023-11-04 DIAGNOSIS — Z79899 Other long term (current) drug therapy: Secondary | ICD-10-CM | POA: Diagnosis not present

## 2023-11-04 DIAGNOSIS — Z8673 Personal history of transient ischemic attack (TIA), and cerebral infarction without residual deficits: Secondary | ICD-10-CM

## 2023-11-04 DIAGNOSIS — R4182 Altered mental status, unspecified: Secondary | ICD-10-CM

## 2023-11-04 DIAGNOSIS — F17210 Nicotine dependence, cigarettes, uncomplicated: Secondary | ICD-10-CM | POA: Diagnosis not present

## 2023-11-04 DIAGNOSIS — D500 Iron deficiency anemia secondary to blood loss (chronic): Secondary | ICD-10-CM | POA: Diagnosis not present

## 2023-11-04 DIAGNOSIS — I10 Essential (primary) hypertension: Secondary | ICD-10-CM | POA: Diagnosis present

## 2023-11-04 DIAGNOSIS — Z7982 Long term (current) use of aspirin: Secondary | ICD-10-CM | POA: Diagnosis not present

## 2023-11-04 DIAGNOSIS — E876 Hypokalemia: Secondary | ICD-10-CM | POA: Diagnosis not present

## 2023-11-04 DIAGNOSIS — Z96641 Presence of right artificial hip joint: Secondary | ICD-10-CM | POA: Insufficient documentation

## 2023-11-04 DIAGNOSIS — G459 Transient cerebral ischemic attack, unspecified: Secondary | ICD-10-CM | POA: Diagnosis not present

## 2023-11-04 DIAGNOSIS — G40909 Epilepsy, unspecified, not intractable, without status epilepticus: Secondary | ICD-10-CM

## 2023-11-04 LAB — CBC
HCT: 41.2 % (ref 39.0–52.0)
Hemoglobin: 12.2 g/dL — ABNORMAL LOW (ref 13.0–17.0)
MCH: 22.2 pg — ABNORMAL LOW (ref 26.0–34.0)
MCHC: 29.6 g/dL — ABNORMAL LOW (ref 30.0–36.0)
MCV: 74.9 fL — ABNORMAL LOW (ref 80.0–100.0)
Platelets: 286 10*3/uL (ref 150–400)
RBC: 5.5 MIL/uL (ref 4.22–5.81)
RDW: 16.6 % — ABNORMAL HIGH (ref 11.5–15.5)
WBC: 6.7 10*3/uL (ref 4.0–10.5)
nRBC: 0 % (ref 0.0–0.2)

## 2023-11-04 LAB — COMPREHENSIVE METABOLIC PANEL WITH GFR
ALT: 16 U/L (ref 0–44)
AST: 17 U/L (ref 15–41)
Albumin: 4 g/dL (ref 3.5–5.0)
Alkaline Phosphatase: 99 U/L (ref 38–126)
Anion gap: 14 (ref 5–15)
BUN: 13 mg/dL (ref 8–23)
CO2: 23 mmol/L (ref 22–32)
Calcium: 9.2 mg/dL (ref 8.9–10.3)
Chloride: 105 mmol/L (ref 98–111)
Creatinine, Ser: 1.04 mg/dL (ref 0.61–1.24)
GFR, Estimated: >60 mL/min (ref >60)
Glucose, Bld: 104 mg/dL — ABNORMAL HIGH (ref 70–99)
Potassium: 3.2 mmol/L — ABNORMAL LOW (ref 3.5–5.1)
Sodium: 142 mmol/L (ref 135–145)
Total Bilirubin: 1 mg/dL (ref 0.0–1.2)
Total Protein: 8.1 g/dL (ref 6.5–8.1)

## 2023-11-04 LAB — DIFFERENTIAL
Abs Immature Granulocytes: 0.02 10*3/uL (ref 0.00–0.07)
Basophils Absolute: 0 10*3/uL (ref 0.0–0.1)
Basophils Relative: 0 %
Eosinophils Absolute: 0.2 10*3/uL (ref 0.0–0.5)
Eosinophils Relative: 3 %
Immature Granulocytes: 0 %
Lymphocytes Relative: 39 %
Lymphs Abs: 2.6 10*3/uL (ref 0.7–4.0)
Monocytes Absolute: 0.8 10*3/uL (ref 0.1–1.0)
Monocytes Relative: 11 %
Neutro Abs: 3 10*3/uL (ref 1.7–7.7)
Neutrophils Relative %: 47 %

## 2023-11-04 LAB — ETHANOL: Alcohol, Ethyl (B): <15 mg/dL (ref <15)

## 2023-11-04 LAB — CBG MONITORING, ED: Glucose-Capillary: 107 mg/dL — ABNORMAL HIGH (ref 70–99)

## 2023-11-04 MED ORDER — ASPIRIN 325 MG PO TABS
325.0000 mg | ORAL_TABLET | Freq: Once | ORAL | Status: AC
  Administered 2023-11-04: 325 mg via ORAL
  Filled 2023-11-04: qty 1

## 2023-11-04 MED ORDER — SODIUM CHLORIDE 0.9% FLUSH
3.0000 mL | Freq: Once | INTRAVENOUS | Status: AC
  Administered 2023-11-04: 3 mL via INTRAVENOUS

## 2023-11-04 NOTE — ED Provider Notes (Signed)
 [facility] Provider Note    Event Date/Time   First MD Initiated Contact with Patient 11/04/23 2305     (approximate)   History   Altered Mental Status   HPI  Mark Durham is a 73 y.o. male   Past medical history of prior CVA, hypertension, hyper lipidemia, who is here in the emergency department due to aphasia and confusion noticed by his wife at around 2115.  Has a history of seizures as well and has been on antiepileptics and compliant, with no recent seizure activities.  He feels well now.  His symptoms are completely resolved.  On review of systems he notes that he has had a mild cough recently.  He has no other acute medical complaints and specifically denies chest pain, shortness of breath, fevers or chills, abdominal pain, GI or GU complaints.  Independent Historian contributed to assessment above: His neighbor is at bedside to corroborate information past medical history as above  External Medical Documents Reviewed: Previous hospital notes documenting CVA with residual cognitive disability       Physical Exam   Triage Vital Signs: ED Triage Vitals  Encounter Vitals Group     BP 11/04/23 2223 117/74     Systolic BP Percentile --      Diastolic BP Percentile --      Pulse Rate 11/04/23 2223 69     Resp 11/04/23 2223 18     Temp 11/04/23 2223 98.7 F (37.1 C)     Temp Source 11/04/23 2223 Oral     SpO2 11/04/23 2223 98 %     Weight 11/04/23 2225 222 lb 0.1 oz (100.7 kg)     Height --      Head Circumference --      Peak Flow --      Pain Score 11/04/23 2225 0     Pain Loc --      Pain Education --      Exclude from Growth Chart --     Most recent vital signs: Vitals:   11/04/23 2223  BP: 117/74  Pulse: 69  Resp: 18  Temp: 98.7 F (37.1 C)  SpO2: 98%    General: Awake, no distress.  CV:  Good peripheral perfusion.  Resp:  Normal effort.  Abd:  No distention.  Other:  Pleasant gentleman sitting in the stretcher  no acute distress with normal vital signs.  Breathing comfortably on room air with clear lungs, soft nontender abdomen to palpation all quadrants.  Neurologic exam shows no dysarthria or facial asymmetry is answering questions appropriately awake alert oriented, motor or sensory exam is normal.   ED Results / Procedures / Treatments   Labs (all labs ordered are listed, but only abnormal results are displayed) Labs Reviewed  CBC - Abnormal; Notable for the following components:      Result Value   Hemoglobin 12.2 (*)    MCV 74.9 (*)    MCH 22.2 (*)    MCHC 29.6 (*)    RDW 16.6 (*)    All other components within normal limits  COMPREHENSIVE METABOLIC PANEL WITH GFR - Abnormal; Notable for the following components:   Potassium 3.2 (*)    Glucose, Bld 104 (*)    All other components within normal limits  CBG MONITORING, ED - Abnormal; Notable for the following components:   Glucose-Capillary 107 (*)    All other components within normal limits  DIFFERENTIAL  ETHANOL  PROTIME-INR  APTT  URINALYSIS,  W/ REFLEX TO CULTURE (INFECTION SUSPECTED)  I-STAT CREATININE, ED     I ordered and reviewed the above labs they are notable for blood glucose is 107.  EKG  ED ECG REPORT I, Buell Carmin, the attending physician, personally viewed and interpreted this ECG.   Date: 11/04/2023  EKG Time: 2232  Rate: 76  Rhythm: sinus  Axis: nl  Intervals:nl  ST&T Change: nl    RADIOLOGY I independently reviewed and interpreted chest x-ray and see no obvious focality or pneumothorax I also reviewed radiologist's formal read.   PROCEDURES:  Critical Care performed: Yes, see critical care procedure note(s)  .Critical Care  Performed by: Buell Carmin, MD Authorized by: Buell Carmin, MD   Critical care provider statement:    Critical care time (minutes):  30   Critical care was time spent personally by me on the following activities:  Development of treatment plan with patient or surrogate,  discussions with consultants, evaluation of patient's response to treatment, examination of patient, ordering and review of laboratory studies, ordering and review of radiographic studies, ordering and performing treatments and interventions, pulse oximetry, re-evaluation of patient's condition and review of old charts    MEDICATIONS ORDERED IN ED: Medications  sodium chloride  flush (NS) 0.9 % injection 3 mL (3 mLs Intravenous Given 11/04/23 2238)  aspirin  tablet 325 mg (325 mg Oral Given 11/04/23 2329)    External physician / consultants:  I spoke with telestroke neurologist regarding care plan for this patient.   IMPRESSION / MDM / ASSESSMENT AND PLAN / ED COURSE  I reviewed the triage vital signs and the nursing notes.                                Patient's presentation is most consistent with acute presentation with potential threat to life or bodily function.  Differential diagnosis includes, but is not limited to, TIA or stroke, metabolic derangements, infection   The patient is on the cardiac monitor to evaluate for evidence of arrhythmia and/or significant heart rate changes.  MDM:    High degree of suspicion for TIA given his transient and resolved aphasia/confusion.  He has no other acute medical complaints except for a mild cough but I doubt bacterial pneumonia given no focality on lung exam, no leukocytosis, no fever, breathing comfortably on room air.  Check stroke labs.  And per neurology recommendations start on 325 aspirin  as well as ongoing stroke workup/TIA workup inpatient.  I ordered an MRI of the brain.       FINAL CLINICAL IMPRESSION(S) / ED DIAGNOSES   Final diagnoses:  Aphasia  TIA (transient ischemic attack)  Altered mental status, unspecified altered mental status type     Rx / DC Orders   ED Discharge Orders     None        Note:  This document was prepared using Dragon voice recognition software and may include unintentional dictation  errors.    Buell Carmin, MD 11/05/23 [phone]

## 2023-11-04 NOTE — Consult Note (Signed)
 [facility] Consult Services   Patient Name:   Mark Durham, Mark Durham Date of Birth:   1951-01-01 Identification Number:   MRN - [id number] Date of Service:   11/04/2023 22:40:17  Diagnosis:       G45.9 - Transient cerebral ischemic attack, unspecified  Impression:      Patient presented with transient confusion and speech disturbances. head Ct shows no acute process. No thrombolytics since patient has no disabling symptoms. Would recommend ASA for secondary prevention.  Our recommendations are outlined below.  Recommendations:        Stroke/Telemetry Floor       Neuro Checks       Bedside Swallow Eval       DVT Prophylaxis       IV Fluids, Normal Saline       Head of Bed 30 Degrees       Euglycemia and Avoid Hyperthermia (PRN Acetaminophen )       Initiate or continue Aspirin  325 MG daily  Sign Out:       Discussed with Emergency Department Provider    ------------------------------------------------------------------------------  Advanced Imaging: Advanced Imaging Deferred because:  Non-disabling symptoms as verified by the patient; no cortical signs so not consistent with LVO   Metrics: Last Known Well: 11/04/2023 21:15:00 Dispatch Time: 11/04/2023 22:40:17 Arrival Time: 11/04/2023 22:10:00 Initial Response Time: 11/04/2023 22:51:00 Symptoms: Confusion . Initial patient interaction: 11/04/2023 22:53:29 NIHSS Assessment Completed: 11/04/2023 22:58:04 Patient is not a candidate for Thrombolytic. Thrombolytic Medical Decision: 11/04/2023 22:58:05 Patient was not deemed candidate for Thrombolytic because of following reasons: Stroke severity too mild (non-disabling) .  CT head showed no acute hemorrhage or acute core infarct.  Primary Provider Notified of Diagnostic Impression and Management Plan on: 11/04/2023 23:05:57    ------------------------------------------------------------------------------  History of Present  Illness: Patient is a 73 year old Male.  Patient was brought by EMS for symptoms of Confusion . Past Medical history of stroke, Seizures, Hypertension, Hyperlipidemia presented with transient confusion. History was provided by the stroke RN. Last seen normal was around 2115. Spouse noticed moment of sudden confusion and word finding difficulty.    Past Medical History:      Hypertension      Hyperlipidemia      Stroke  Medications:  No Anticoagulant use  Antiplatelet use: Yes asa Reviewed EMR for current medications  Allergies:  Reviewed  Social History: Drug Use: No  Family History:  There is no family history of premature cerebrovascular disease pertinent to this consultation  ROS : 14 Points Review of Systems was performed and was negative except mentioned in HPI.  Past Surgical History: There Is No Surgical History Contributory To Today's Visit     Examination: BP(117/74), Pulse(69), 1A: Level of Consciousness - Alert; keenly responsive + 0 1B: Ask Month and Age - Could Not Answer Either Question Correctly + 2 1C: Blink Eyes & Squeeze Hands - Performs Both Tasks + 0 2: Test Horizontal Extraocular Movements - Normal + 0 3: Test Visual Fields - No Visual Loss + 0 4: Test Facial Palsy (Use Grimace if Obtunded) - Normal symmetry + 0 5A: Test Left Arm Motor Drift - No Drift for 10 Seconds + 0 5B: Test Right Arm Motor Drift - No Drift for 10 Seconds + 0 6A: Test Left Leg Motor Drift - No Drift for 5 Seconds + 0 6B: Test Right Leg Motor Drift - No Drift for 5 Seconds + 0 7: Test Limb Ataxia (FNF/Heel-Shin) - No Ataxia + 0  8: Test Sensation - Normal; No sensory loss + 0 9: Test Language/Aphasia - Normal; No aphasia + 0 10: Test Dysarthria - Normal + 0 11: Test Extinction/Inattention - No abnormality + 0  NIHSS Score: 2   Pre-Morbid Modified Rankin Scale: 3 Points = Moderate disability; requiring some help, but able to walk without assistance  Spoke with : Dr  Martina Sledge  This consult was conducted in real time using interactive audio and Immunologist. Patient was informed of the technology being used for this visit and agreed to proceed. Patient located in hospital and provider located at home/office setting.   Patient is being evaluated for possible acute neurologic impairment and high probability of imminent or life-threatening deterioration. I spent total of 30 minutes providing care to this patient, including time for face to face visit via telemedicine, review of medical records, imaging studies and discussion of findings with providers, the patient and/or family.   Dr Denea Cheaney Mc-ONeil Jannell Franta   [facility] For Inpatient follow-up with [facility] physician please call [facility] at [phone]. As we are not an outpatient service for any post hospital discharge needs please contact the hospital for assistance. If you have any questions for the [facility] physicians or need to reconsult for clinical or diagnostic changes please contact us  via [facility] at [phone].

## 2023-11-04 NOTE — Progress Notes (Signed)
 Code stroke timeline MRS 3 LKW 2115 2238 code stroke cart activation, pt in CT, report given to TSRN by RN 2240 Neuro TSMD paged 2249 Pt returned from CT 2251 Neuro TSMD on camera 2305 Neuro TSMD and TSRN off camera.

## 2023-11-04 NOTE — Progress Notes (Signed)
   11/04/23 2230  Spiritual Encounters  Type of Visit Initial  Care provided to: Pt and family  Conversation partners present during encounter Nurse  Reason for visit Code  OnCall Visit Yes   Chaplain responded to a Code Stroke.  Went to patient and staff shared that family was present.  Chaplain walked family back and stayed at bedside with patient's wife and neighbor.  Patient was in good spirits and wife was tearful and concerned.  Chaplain offered a compassionate presence and prayer.  Rev. Rana M. Nolon Baxter, M.Div. Chaplain Resident [facility]

## 2023-11-04 NOTE — ED Notes (Signed)
 Call code stroke per Dr. Martina Sledge

## 2023-11-04 NOTE — ED Triage Notes (Addendum)
 Pt in from home via [facility] with altered mental status per wife. She states at 2115, pt was out on the porch talking to her and suddenly became aphasic and unable to get out his words. EMS was called out to the house for stroke-like symptoms. They arrived and reported no unilateral weakness, has been a&ox3 - near to baseline per wife. Hx of CVA, seizures and TIA  VS en route: 136/78 100% 76HR [id number]

## 2023-11-04 NOTE — ED Notes (Addendum)
 Patient transported to CT with Debroah Fanning

## 2023-11-04 NOTE — ED Provider Notes (Incomplete)
 [facility] Provider Note    Event Date/Time   First MD Initiated Contact with Patient 11/04/23 2305     (approximate)   History   Altered Mental Status   HPI  JODECI ROARTY is a 73 y.o. male   Past medical history of prior CVA, hypertension, hyper lipidemia, who is here in the emergency department due to aphasia and confusion noticed by his wife at around 2115.  Has a history of seizures as well and has been on antiepileptics and compliant, with no recent seizure activities.  He feels well now.  His symptoms are completely resolved.  He has no acute medical complaints  Independent Historian contributed to assessment above: ***  External Medical Documents Reviewed: ***      Physical Exam   Triage Vital Signs: ED Triage Vitals  Encounter Vitals Group     BP 11/04/23 2223 117/74     Systolic BP Percentile --      Diastolic BP Percentile --      Pulse Rate 11/04/23 2223 69     Resp 11/04/23 2223 18     Temp 11/04/23 2223 98.7 F (37.1 C)     Temp Source 11/04/23 2223 Oral     SpO2 11/04/23 2223 98 %     Weight 11/04/23 2225 222 lb 0.1 oz (100.7 kg)     Height --      Head Circumference --      Peak Flow --      Pain Score 11/04/23 2225 0     Pain Loc --      Pain Education --      Exclude from Growth Chart --     Most recent vital signs: Vitals:   11/04/23 2223  BP: 117/74  Pulse: 69  Resp: 18  Temp: 98.7 F (37.1 C)  SpO2: 98%    General: Awake, no distress. *** CV:  Good peripheral perfusion. *** Resp:  Normal effort. *** Abd:  No distention. *** Other:  ***   ED Results / Procedures / Treatments   Labs (all labs ordered are listed, but only abnormal results are displayed) Labs Reviewed  CBC - Abnormal; Notable for the following components:      Result Value   Hemoglobin 12.2 (*)    MCV 74.9 (*)    MCH 22.2 (*)    MCHC 29.6 (*)    RDW 16.6 (*)    All other components within normal limits  COMPREHENSIVE  METABOLIC PANEL WITH GFR - Abnormal; Notable for the following components:   Potassium 3.2 (*)    Glucose, Bld 104 (*)    All other components within normal limits  CBG MONITORING, ED - Abnormal; Notable for the following components:   Glucose-Capillary 107 (*)    All other components within normal limits  DIFFERENTIAL  ETHANOL  PROTIME-INR  APTT  URINALYSIS, W/ REFLEX TO CULTURE (INFECTION SUSPECTED)  I-STAT CREATININE, ED     I ordered and reviewed the above labs they are notable for ***  EKG  ED ECG REPORT I, Buell Carmin, the attending physician, personally viewed and interpreted this ECG.   Date: 11/04/2023  EKG Time: ***  Rate: ***  Rhythm: {ekg findings:315101}  Axis: ***  Intervals:{conduction defects:17367}  ST&T Change: ***    RADIOLOGY I independently reviewed and interpreted *** I also reviewed radiologist's formal read.   PROCEDURES:  Critical Care performed: {CriticalCareYesNo:19197::"Yes, see critical care procedure note(s)","No"}  Procedures   MEDICATIONS ORDERED  IN ED: Medications  aspirin  tablet 325 mg (has no administration in time range)  sodium chloride  flush (NS) 0.9 % injection 3 mL (3 mLs Intravenous Given 11/04/23 2238)    External physician / consultants:  I spoke with *** regarding care plan for this patient.   IMPRESSION / MDM / ASSESSMENT AND PLAN / ED COURSE  I reviewed the triage vital signs and the nursing notes.                                Patient's presentation is most consistent with {EM COPA:27473}  Differential diagnosis includes, but is not limited to, ***   ***The patient is on the cardiac monitor to evaluate for evidence of arrhythmia and/or significant heart rate changes.  MDM:  ***  I considered hospitalization for admission or observation ***        FINAL CLINICAL IMPRESSION(S) / ED DIAGNOSES   Final diagnoses:  Aphasia  TIA (transient ischemic attack)  Altered mental status, unspecified  altered mental status type     Rx / DC Orders   ED Discharge Orders     None        Note:  This document was prepared using Dragon voice recognition software and may include unintentional dictation errors.

## 2023-11-05 ENCOUNTER — Emergency Department

## 2023-11-05 ENCOUNTER — Encounter: Payer: Self-pay | Admitting: Radiology

## 2023-11-05 DIAGNOSIS — G459 Transient cerebral ischemic attack, unspecified: Secondary | ICD-10-CM | POA: Diagnosis not present

## 2023-11-05 DIAGNOSIS — Z8673 Personal history of transient ischemic attack (TIA), and cerebral infarction without residual deficits: Secondary | ICD-10-CM

## 2023-11-05 DIAGNOSIS — G40209 Localization-related (focal) (partial) symptomatic epilepsy and epileptic syndromes with complex partial seizures, not intractable, without status epilepticus: Secondary | ICD-10-CM | POA: Diagnosis not present

## 2023-11-05 LAB — URINALYSIS, W/ REFLEX TO CULTURE (INFECTION SUSPECTED)
Bilirubin Urine: NEGATIVE
Glucose, UA: NEGATIVE mg/dL
Hgb urine dipstick: NEGATIVE
Ketones, ur: NEGATIVE mg/dL
Leukocytes,Ua: NEGATIVE
Nitrite: NEGATIVE
Protein, ur: NEGATIVE mg/dL
RBC / HPF: 0 RBC/hpf (ref 0–5)
Specific Gravity, Urine: 1.012 (ref 1.005–1.030)
WBC, UA: 0 WBC/hpf (ref 0–5)
pH: 7 (ref 5.0–8.0)

## 2023-11-05 LAB — BASIC METABOLIC PANEL WITH GFR
Anion gap: 11 (ref 5–15)
BUN: 14 mg/dL (ref 8–23)
CO2: 23 mmol/L (ref 22–32)
Calcium: 9.1 mg/dL (ref 8.9–10.3)
Chloride: 102 mmol/L (ref 98–111)
Creatinine, Ser: 1.04 mg/dL (ref 0.61–1.24)
GFR, Estimated: >60 mL/min (ref >60)
Glucose, Bld: 90 mg/dL (ref 70–99)
Potassium: 3.1 mmol/L — ABNORMAL LOW (ref 3.5–5.1)
Sodium: 136 mmol/L (ref 135–145)

## 2023-11-05 LAB — LIPID PANEL
Cholesterol: 77 mg/dL (ref 0–200)
HDL: 25 mg/dL — ABNORMAL LOW (ref >40)
LDL Cholesterol: 38 mg/dL (ref 0–99)
Total CHOL/HDL Ratio: 3.1 ratio
Triglycerides: 71 mg/dL (ref <150)
VLDL: 14 mg/dL (ref 0–40)

## 2023-11-05 LAB — IRON AND TIBC
Iron: 30 ug/dL — ABNORMAL LOW (ref 45–182)
Saturation Ratios: 6 % — ABNORMAL LOW (ref 17.9–39.5)
TIBC: 497 ug/dL — ABNORMAL HIGH (ref 250–450)
UIBC: 467 ug/dL

## 2023-11-05 LAB — HEMOGLOBIN A1C
Hgb A1c MFr Bld: 5.1 % (ref 4.8–5.6)
Mean Plasma Glucose: 99.67 mg/dL

## 2023-11-05 LAB — PROTIME-INR
INR: 1.1 (ref 0.8–1.2)
Prothrombin Time: 14.7 s (ref 11.4–15.2)

## 2023-11-05 LAB — APTT: aPTT: 24 s (ref 24–36)

## 2023-11-05 LAB — MAGNESIUM: Magnesium: 1.1 mg/dL — ABNORMAL LOW (ref 1.7–2.4)

## 2023-11-05 LAB — TSH: TSH: 1.489 u[IU]/mL (ref 0.350–4.500)

## 2023-11-05 LAB — FOLATE: Folate: 9.7 ng/mL (ref >5.9)

## 2023-11-05 LAB — PHOSPHORUS: Phosphorus: 2.5 mg/dL (ref 2.5–4.6)

## 2023-11-05 MED ORDER — ATORVASTATIN CALCIUM 20 MG PO TABS
80.0000 mg | ORAL_TABLET | Freq: Every day | ORAL | Status: DC
  Administered 2023-11-05: 80 mg via ORAL
  Filled 2023-11-05: qty 4

## 2023-11-05 MED ORDER — STROKE: EARLY STAGES OF RECOVERY BOOK: Freq: Once | Status: AC

## 2023-11-05 MED ORDER — AMLODIPINE BESYLATE 10 MG PO TABS
10.0000 mg | ORAL_TABLET | Freq: Every day | ORAL | Status: DC
  Filled 2023-11-05: qty 1

## 2023-11-05 MED ORDER — ATORVASTATIN CALCIUM 20 MG PO TABS
40.0000 mg | ORAL_TABLET | Freq: Every day | ORAL | Status: DC
  Administered 2023-11-06: 40 mg via ORAL
  Filled 2023-11-05: qty 2

## 2023-11-05 MED ORDER — POTASSIUM CHLORIDE CRYS ER 20 MEQ PO TBCR
40.0000 meq | EXTENDED_RELEASE_TABLET | ORAL | Status: AC
  Administered 2023-11-05 (×2): 40 meq via ORAL
  Filled 2023-11-05 (×2): qty 2

## 2023-11-05 MED ORDER — ACETAMINOPHEN 325 MG PO TABS: 650.0000 mg | ORAL_TABLET | ORAL | Status: DC | PRN

## 2023-11-05 MED ORDER — SENNA 8.6 MG PO TABS: 2.0000 | ORAL_TABLET | Freq: Every evening | ORAL | Status: DC | PRN

## 2023-11-05 MED ORDER — ACETAMINOPHEN 650 MG RE SUPP: 650.0000 mg | RECTAL | Status: DC | PRN

## 2023-11-05 MED ORDER — ASPIRIN 81 MG PO TBEC
81.0000 mg | DELAYED_RELEASE_TABLET | Freq: Every day | ORAL | Status: DC
  Administered 2023-11-05 – 2023-11-06 (×2): 81 mg via ORAL
  Filled 2023-11-05 (×2): qty 1

## 2023-11-05 MED ORDER — LORAZEPAM 2 MG/ML IJ SOLN: 2.0000 mg | INTRAMUSCULAR | Status: DC | PRN

## 2023-11-05 MED ORDER — MAGNESIUM SULFATE 4 GM/100ML IV SOLN
4.0000 g | Freq: Once | INTRAVENOUS | Status: AC
  Administered 2023-11-05: 4 g via INTRAVENOUS
  Filled 2023-11-05: qty 100

## 2023-11-05 MED ORDER — ACETAMINOPHEN 160 MG/5ML PO SOLN: 650.0000 mg | ORAL | Status: DC | PRN

## 2023-11-05 MED ORDER — LACOSAMIDE 50 MG PO TABS
200.0000 mg | ORAL_TABLET | Freq: Two times a day (BID) | ORAL | Status: DC
  Administered 2023-11-05 – 2023-11-06 (×3): 200 mg via ORAL
  Filled 2023-11-05 (×3): qty 4

## 2023-11-05 MED ORDER — CLOPIDOGREL BISULFATE 75 MG PO TABS
75.0000 mg | ORAL_TABLET | Freq: Every day | ORAL | Status: DC
  Administered 2023-11-05 (×2): 75 mg via ORAL
  Filled 2023-11-05 (×2): qty 1

## 2023-11-05 MED ORDER — POTASSIUM CHLORIDE CRYS ER 20 MEQ PO TBCR
40.0000 meq | EXTENDED_RELEASE_TABLET | Freq: Once | ORAL | Status: AC
  Administered 2023-11-05: 40 meq via ORAL
  Filled 2023-11-05: qty 2

## 2023-11-05 MED ORDER — IRON SUCROSE 300 MG IVPB - SIMPLE MED
300.0000 mg | Freq: Every day | Status: AC
  Administered 2023-11-05 – 2023-11-06 (×2): 300 mg via INTRAVENOUS
  Filled 2023-11-05 (×2): qty 300

## 2023-11-05 MED ORDER — HYDRALAZINE HCL 20 MG/ML IJ SOLN: 10.0000 mg | Freq: Four times a day (QID) | INTRAMUSCULAR | Status: DC | PRN

## 2023-11-05 MED ORDER — ENOXAPARIN SODIUM 60 MG/0.6ML IJ SOSY
0.5000 mg/kg | PREFILLED_SYRINGE | INTRAMUSCULAR | Status: DC
  Administered 2023-11-05 – 2023-11-06 (×2): 50 mg via SUBCUTANEOUS
  Filled 2023-11-05 (×2): qty 0.6

## 2023-11-05 MED ORDER — POLYETHYLENE GLYCOL 3350 17 G PO PACK
17.0000 g | PACK | Freq: Every day | ORAL | Status: DC
  Filled 2023-11-05: qty 1

## 2023-11-05 MED ORDER — HYDRALAZINE HCL 50 MG PO TABS: 50.0000 mg | ORAL_TABLET | Freq: Four times a day (QID) | ORAL | Status: DC | PRN

## 2023-11-05 NOTE — Evaluation (Signed)
 Physical Therapy Evaluation Patient Details Name: Mark Durham MRN: [id number] DOB: September 13, 1950 Today's Date: 11/05/2023  History of Present Illness  Mark Durham is a 73 y.o. 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 CT head code stroke protocol was negative.   Clinical Impression  Patient supine in bed upon arrival, agreeable to PT evaluation. Prior to admission at functional baseline, patient was requiring CGA with mobility tasks with use of RW and assistance with ADLs/IADLs. Patient lives with spouse in single level home, where spouse provides assist as needed. Upon evaluation, patient presents at baseline requiring CGA with transfers and ambulation. Patient was able to ambulate approx 60 ft with use of RW. Patient demo decreased step length on R side d/t r sided residual hemiparesis from prior CVA. Improvements with VC's. No further skilled acute skilled PT services needed at this time due to patient presenting at baseline. Patient left in recliner with meal set up and all needs in reach. RN Notified of patient positioning. PT will sign off. Please re-consult if additional needs arise.       If plan is discharge home, recommend the following: A little help with walking and/or transfers;Assist for transportation;Help with stairs or ramp for entrance   Can travel by private vehicle        Equipment Recommendations None recommended by PT  Recommendations for Other Services       Functional Status Assessment Patient has not had a recent decline in their functional status     Precautions / Restrictions Restrictions Weight Bearing Restrictions Per Provider Order: No      Mobility  Bed Mobility Overal bed mobility: Needs Assistance Bed Mobility: Supine to Sit     Supine  to sit: Contact guard     General bed mobility comments: verbal cues required for sequencing, CGA for steadying. require therapist assist to donn socks EOB, patient reports wife assists with this task.    Transfers Overall transfer level: Needs assistance Equipment used: Rolling walker (2 wheels) Transfers: Sit to/from Stand, Bed to chair/wheelchair/BSC Sit to Stand: Contact guard assist   Step pivot transfers: Contact guard assist       General transfer comment: able to stand from EOB/recliner wtih use of RW, CGA/supervision. cues for hand placement.    Ambulation/Gait Ambulation/Gait assistance: Contact guard assist, Supervision Gait Distance (Feet): 50 Feet Assistive device: Rolling walker (2 wheels)   Gait velocity: Decreased     General Gait Details: Pt able to ambulate x 60 ft as well as ambulate to to bathroom with use of RW, CGA progressing to supervision. Decreased step length on RLE noted. Improvements in step length with intermittent cues. Patient reports gait is at baseline. Per chart review, PT in agreement.  Stairs Stairs:  (Pt refused to trial due to breakfast tray arriving. Do not anticipate any difficulties, patient presenting at baseline.)          Wheelchair Mobility     Tilt Bed    Modified Rankin (Stroke Patients Only)       Balance Overall balance assessment: Needs assistance Sitting-balance support: Feet supported, No upper extremity supported Sitting balance-Leahy Scale: Normal     Standing balance support: Bilateral upper extremity supported, During functional activity, Reliant on assistive device for balance Standing balance-Leahy Scale: Good Standing balance comment: good stability with use  of AD                             Pertinent Vitals/Pain Pain Assessment Pain Assessment: No/denies pain    Home Living Family/patient expects to be discharged to:: Private residence Living Arrangements: Spouse/significant  other Available Help at Discharge: Family Type of Home: House Home Access: Stairs to enter Entrance Stairs-Rails: Can reach both;Right;Left Entrance Stairs-Number of Steps: 5   Home Layout: One level Home Equipment: Agricultural consultant (2 wheels);BSC/3in1;Wheelchair - manual;Shower seat;Cane - single point      Prior Function Prior Level of Function : Needs assist             Mobility Comments: No family at time of evaluation. Use of RW mainly, endorse some use of SPC. Reports wife assists intermittent with ambulation. Denies falls. ADLs Comments: Wife drives. Requires some assist from wife for ADLs/IADLs per patient reports.     Extremity/Trunk Assessment   Upper Extremity Assessment Upper Extremity Assessment: Overall WFL for tasks assessed    Lower Extremity Assessment Lower Extremity Assessment: Generalized weakness (History of R weaknesss prior CVA)       Communication   Communication Communication: No apparent difficulties    Cognition Arousal: Alert Behavior During Therapy: WFL for tasks assessed/performed   PT - Cognitive impairments: History of cognitive impairments                         Following commands: Intact       Cueing Cueing Techniques: Verbal cues     General Comments      Exercises     Assessment/Plan    PT Assessment All further PT needs can be met in the next venue of care  PT Problem List Decreased strength;Decreased mobility       PT Treatment Interventions      PT Goals (Current goals can be found in the Care Plan section)  Acute Rehab PT Goals Patient Stated Goal: Get Home Today PT Goal Formulation: With patient    Frequency       Co-evaluation               AM-PAC PT "6 Clicks" Mobility  Outcome Measure Help needed turning from your back to your side while in a flat bed without using bedrails?: None Help needed moving from lying on your back to sitting on the side of a flat bed without using  bedrails?: A Little Help needed moving to and from a bed to a chair (including a wheelchair)?: A Little Help needed standing up from a chair using your arms (e.g., wheelchair or bedside chair)?: A Little Help needed to walk in hospital room?: A Little Help needed climbing 3-5 steps with a railing? : A Little 6 Click Score: 19    End of Session Equipment Utilized During Treatment: Gait belt Activity Tolerance: Patient tolerated treatment well Patient left: in chair;with call bell/phone within reach Nurse Communication: Mobility status PT Visit Diagnosis: Other abnormalities of gait and mobility (R26.89);Muscle weakness (generalized) (M62.81)    Time: 5621-3086 PT Time Calculation (min) (ACUTE ONLY): 21 min   Charges:   PT Evaluation $PT Eval Low Complexity: 1 Low   PT General Charges $$ ACUTE PT VISIT: 1 Visit         Quillian Brunt Fairly, PT, DPT 11/05/23 9:58 AM

## 2023-11-05 NOTE — Progress Notes (Signed)
 Occupational Therapy * Physical Therapy * Speech Therapy  DATE 11/05/2023 PATIENT NAME: Mark Durham PATIENT MRN [id number]  DIAGNOSIS/DIAGNOSIS CODE: Right-sided hemiparesis from prior CVA DATE OF DISCHARGE  PRIMARY CARE PHYSICIAN: Dr. Baltazar Leventhal PCP PHONE/FAX: Phone: [phone]/ Fax: [phone]   Dear Provider:   I certify that I have examined this patient and that occupational/physical/speech therapy is necessary on an outpatient basis.    The patient has expressed interest in completing their recommended course of therapy at your location.  Once a formal order from the patient's primary care physician has been obtained, please contact him/her to schedule an appointment for evaluation at your earliest convenience.  [ X ]  Physical Therapy Evaluate and Treat  [  ]  Occupational Therapy Evaluate and Treat  [  ]  Speech Therapy Evaluate and Treat  The patient's primary care physician (listed above) must furnish and be responsible for a formal order such that the recommended services may be furnished while under the primary physician's care, and that the plan of care will be established and reviewed every 30 days (or more often if condition necessitates).

## 2023-11-05 NOTE — Plan of Care (Signed)
   Problem: Education: Goal: Knowledge of disease or condition will improve Outcome: Progressing Goal: Knowledge of secondary prevention will improve (MUST DOCUMENT ALL) Outcome: Progressing Goal: Knowledge of patient specific risk factors will improve (DELETE if not current risk factor) Outcome: Progressing

## 2023-11-05 NOTE — Progress Notes (Addendum)
 SLP Cancellation Note  Patient Details Name: Mark Durham MRN: [id number] DOB: 1950-07-14   Cancelled treatment:       Reason Eval/Treat Not Completed: SLP screened, no needs identified, will sign off- Last SLP evaluation completed in out patient on 11/14/2022 revealing moderate transcortical aphasia that is further complicated by visual field deficits and short memory impairments- with objective impairments including attention, memory, awareness, executive functioning, expressive language, receptive language, and aphasia.  During this admission, MRI and Head CT negative for acute injury. Pt reporting now at baseline per H&P. Pt reiterated current baseline status during SLP screen. Family not present for verification.   Based on lack of intracranial process/neuro involvement and report of pt being at baseline, acute SLP services is not indicated.  Mark Doniven Vanpatten Clapp, MS, CCC-SLP Speech Language Pathologist Rehab Services; [facility] [phone] (ascom)    Mark Durham 11/05/2023, 9:07 AM

## 2023-11-05 NOTE — H&P (Signed)
 History and Physical    Patient: Mark Durham OZH:[id number] DOB: 12-Aug-1950 DOA: 11/04/2023 DOS: the patient was seen and examined on 11/05/2023 PCP: [facility]  Patient coming from: Home  Chief Complaint:  Chief Complaint  Patient presents with   Altered Mental Status    HPI: Mark Durham is a 73 y.o. 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  He was previously at his baseline.  CT head code stroke protocol was negative.  Patient was seen by teleneuro at which time NIHSS was 2.  Advanced imaging and tPA were not recommended due to nondisabling symptoms and no signs consistent with LVO.  At the time of my evaluation, patient feels like he is back to his baseline. Additional ED course: Vitals were within normal limits.  Labs including CBC, CMP, EtOH notable for mild hypokalemia of 3.2 with baseline hemoglobin of 12.2. EKG, personally viewed and interpreted showing sinus at 76 Chest x-ray clear MRI brain pending Patient treated with aspirin  325 mg Hospitalist consulted for admission.   Review of Systems: As mentioned in the history of present illness. All other systems reviewed and are negative.  Past Medical History:  Diagnosis Date   CVA (cerebral vascular accident) (HCC)    Hypertension    Past Surgical History:  Procedure Laterality Date   TOTAL HIP ARTHROPLASTY Right    Social History:  reports that he has been smoking. He has been exposed to tobacco smoke. He has never used smokeless tobacco. He reports that he does not drink alcohol and does not use drugs.  No Known Allergies  Family History  Problem Relation Age of Onset   Hypertension Mother     Prior to Admission medications    Medication Sig Start Date End Date Taking? Authorizing Provider  acetaminophen  (TYLENOL ) 500 MG tablet Take 1,000 mg by mouth every 6 (six) hours as needed for moderate pain (as needed for pain). Take 2 tablets 3x per day as needed for pain   Yes [provider]  amLODipine  (NORVASC ) 10 MG tablet Take 10 mg by mouth daily.   Yes [provider]  aspirin  EC 81 MG tablet Take 81 mg by mouth daily. Swallow whole.   Yes [provider]  atorvastatin  (LIPITOR) 80 MG tablet Take 80 mg by mouth daily. Take 1 tablet by mouth daily   Yes [provider]  Lacosamide  100 MG TABS Take 200 mg by mouth 2 (two) times daily.   Yes [provider]  omeprazole (PRILOSEC OTC) 20 MG tablet Take 20 mg by mouth daily. Take 1 tablet by mouth daily as needed.   Yes [provider]  polyethylene glycol (MIRALAX  / GLYCOLAX ) 17 g packet Take 17 g by mouth daily. Drink by mouth daily as needed.   Yes [provider]  senna (SENOKOT) 8.6 MG TABS tablet Take 2 tablets (17.2 mg total) by mouth at bedtime as needed for mild constipation. Take 2 tablets by mouth nightly. 01/12/23  Yes Althia Atlas, MD  tadalafil  (CIALIS ) 20 MG tablet Take 1 tablet (20 mg total) by mouth daily as needed for erectile dysfunction (1 hour prior to sexual activity). Patient not taking: Reported on 11/15/2022 11/18/21   Lawerence Pressman,  MD    Physical Exam: Vitals:   11/04/23 2223 11/04/23 2225 11/04/23 2300 11/05/23 0000  BP: 117/74  (!) 152/85 (!) 148/87  Pulse: 69  74 79  Resp: 18  18 20   Temp: 98.7 F (37.1 C)     TempSrc: Oral     SpO2: 98%  97% 100%  Weight:  100.7 kg     Physical Exam Vitals and nursing note reviewed.  Constitutional:      General: He is not in acute distress. HENT:     Head: Normocephalic and atraumatic.  Cardiovascular:     Rate and Rhythm: Normal rate and regular rhythm.     Heart sounds: Normal heart sounds.  Pulmonary:     Effort: Pulmonary effort  is normal.     Breath sounds: Normal breath sounds.  Abdominal:     Palpations: Abdomen is soft.     Tenderness: There is no abdominal tenderness.  Neurological:     Mental Status: Mental status is at baseline.     Labs on Admission: I have personally reviewed following labs and imaging studies  CBC: Recent Labs  Lab 11/04/23 2228  WBC 6.7  NEUTROABS 3.0  HGB 12.2*  HCT 41.2  MCV 74.9*  PLT 286   Basic Metabolic Panel: Recent Labs  Lab 11/04/23 2228  NA 142  K 3.2*  CL 105  CO2 23  GLUCOSE 104*  BUN 13  CREATININE 1.04  CALCIUM  9.2   GFR: CrCl cannot be calculated (Unknown ideal weight.). Liver Function Tests: Recent Labs  Lab 11/04/23 2228  AST 17  ALT 16  ALKPHOS 99  BILITOT 1.0  PROT 8.1  ALBUMIN 4.0   No results for input(s): "LIPASE", "AMYLASE" in the last 168 hours. No results for input(s): "AMMONIA" in the last 168 hours. Coagulation Profile: No results for input(s): "INR", "PROTIME" in the last 168 hours. Cardiac Enzymes: No results for input(s): "CKTOTAL", "CKMB", "CKMBINDEX", "TROPONINI" in the last 168 hours. BNP (last 3 results) No results for input(s): "PROBNP" in the last 8760 hours. HbA1C: No results for input(s): "HGBA1C" in the last 72 hours. CBG: Recent Labs  Lab 11/04/23 2251  GLUCAP 107*   Lipid Profile: No results for input(s): "CHOL", "HDL", "LDLCALC", "TRIG", "CHOLHDL", "LDLDIRECT" in the last 72 hours. Thyroid Function Tests: No results for input(s): "TSH", "T4TOTAL", "FREET4", "T3FREE", "THYROIDAB" in the last 72 hours. Anemia Panel: No results for input(s): "VITAMINB12", "FOLATE", "FERRITIN", "TIBC", "IRON", "RETICCTPCT" in the last 72 hours. Urine analysis:    Component Value Date/Time   COLORURINE YELLOW (A) 01/27/2023 1655   APPEARANCEUR CLEAR (A) 01/27/2023 1655   APPEARANCEUR Clear 04/15/2021 0908   LABSPEC 1.019 01/27/2023 1655   PHURINE 5.0 01/27/2023 1655   GLUCOSEU NEGATIVE 01/27/2023 1655   HGBUR  NEGATIVE 01/27/2023 1655   BILIRUBINUR NEGATIVE 01/27/2023 1655   BILIRUBINUR Negative 04/15/2021 0908   KETONESUR NEGATIVE 01/27/2023 1655   PROTEINUR NEGATIVE 01/27/2023 1655   NITRITE NEGATIVE 01/27/2023 1655   LEUKOCYTESUR NEGATIVE 01/27/2023 1655    Radiological Exams on Admission: DG Chest Port 1 View Result Date: 11/04/2023 CLINICAL DATA:  Altered level of consciousness EXAM: PORTABLE CHEST 1 VIEW COMPARISON:  01/09/2023 FINDINGS: Single frontal view of the chest demonstrates a stable cardiac silhouette. No acute airspace disease, effusion, or pneumothorax. No acute bony abnormalities. IMPRESSION: 1. No acute intrathoracic process. Electronically Signed   By: Bobbye Burrow M.D.   On: 11/04/2023 23:38   CT HEAD CODE STROKE WO CONTRAST` Result Date: 11/04/2023  CLINICAL DATA:  Code stroke. Initial evaluation for acute neuro deficit, stroke suspected. EXAM: CT HEAD WITHOUT CONTRAST TECHNIQUE: Contiguous axial images were obtained from the base of the skull through the vertex without intravenous contrast. RADIATION DOSE REDUCTION: This exam was performed according to the departmental dose-optimization program which includes automated exposure control, adjustment of the mA and/or kV according to patient size and/or use of iterative reconstruction technique. COMPARISON:  Prior CT from 03/31/2023 as well as earlier studies. FINDINGS: Brain: Generalized age-related cerebral atrophy with moderate chronic microvascular ischemic disease. No acute intracranial hemorrhage. No acute large vessel territory infarct. 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 Small benign appearing cyst within the pituitary gland noted as well, also stable. No other mass lesion or mass effect. Ventricular prominence somewhat out of proportion to cortical sulcation again noted, stable from prior. No extra-axial fluid collection. Vascular: No convincing abnormal hyperdense vessel. Scattered  calcified atherosclerosis present at the skull base. Skull: Scalp soft tissues demonstrate no acute finding. Small lipoma noted at the central forehead. Calvarium intact. Sinuses/Orbits: Globes orbital soft tissues demonstrate no acute finding. Prior ocular lens replacement on the right. Paranasal sinuses are largely clear. No mastoid effusion. Other: None. ASPECTS [facility] Stroke Program Early CT Score) - Ganglionic level infarction (caudate, lentiform nuclei, internal capsule, insula, M1-M3 cortex): 7 - Supraganglionic infarction (M4-M6 cortex): 3 Total score (0-10 with 10 being normal): 10 IMPRESSION: 1. No acute intracranial abnormality. 2. ASPECTS is 10. 3. Age-related cerebral volume loss with moderate chronic microvascular ischemic disease. 4. Mild ventricular prominence somewhat out of proportion to cortical sulcation. While these findings may be related to central atrophy, possible changes of NPH could also have this appearance, and could be considered in the correct clinical setting. Results were called by telephone at the time of interpretation on 11/04/2023 at 10:52 pm to provider Bryson Carbine , who verbally acknowledged these results. Electronically Signed   By: Virgia Griffins M.D.   On: 11/04/2023 22:55     Data Reviewed: Relevant notes from primary care and specialist visits, past discharge summaries as available in EHR, including Care Everywhere. Prior diagnostic testing as pertinent to current admission diagnoses Updated medications and problem lists for reconciliation ED course, including vitals, labs, imaging, treatment and response to treatment Triage notes, nursing and pharmacy notes and ED provider's notes Notable results as noted in HPI   Assessment and Plan: * TIA (transient ischemic attack) History of CVA with poststroke epilepsy Permissive hypertension for first 24-48 hrs post stroke onset: Prn Labetalol IV or Vasotec IV If BP greater than 220/120  Statins for LDL  goal less than 70 ASA 81mg  daily, Plavix  75mg  daily x 3 weeks then monotherapy thereafter Telemetry, echo and MRI Avoid dextrose  containing fluids, Maintain euglycemia, euthermia Neuro checks q4 hrs x 24 hrs and then per shift Head of bed 30 degrees Physical therapy/Occupational therapy/Speech therapy if failed dysphagia screen Neurology consult to follow   HTN (hypertension) Hold antihypertensive for permissive hypertension      DVT prophylaxis: Lovenox   Consults: neurology, Dr Alecia Ames  Advance Care Planning:   Code Status: Prior   Family Communication: none  Disposition Plan: Back to previous home environment  Severity of Illness: The appropriate patient status for this patient is OBSERVATION. Observation status is judged to be reasonable and necessary in order to provide the required intensity of service to ensure the patient's safety. The patient's presenting symptoms, physical exam findings, and initial radiographic and laboratory data in the  context of their medical condition is felt to place them at decreased risk for further clinical deterioration. Furthermore, it is anticipated that the patient will be medically stable for discharge from the hospital within 2 midnights of admission.   Author: Lanetta Pion, MD 11/05/2023 12:36 AM  For on call review [email or web address].

## 2023-11-05 NOTE — Hospital Course (Signed)
 Mark Durham

## 2023-11-05 NOTE — Consult Note (Signed)
 NEUROLOGY CONSULT NOTE   Date of service: November 05, 2023 Patient Name: Mark Durham MRN:  [id number] DOB:  1950/12/03 Chief Complaint: "Episode of behavioral arrest" Requesting Provider: Althia Atlas, MD  History of Present Illness  Mark Durham is a 73 y.o. male with hx of previous stroke as well as seizures who was in his normal state of health yesterday when he suddenly stopped speaking.  His wife describes that it was not just speaking that he stopped, he had a fixed stare with eyes midline.  He would not interact with her even if she try to get his attention.  She called 911 and by the time they started asking her to get him to try to do things, he was starting to respond, but was quite confused.  He was still slightly confused when EMS left with him but by the time she got to see him in the emergency department, he had returned to baseline.  He does have a history of seizures and was previously on two antiepileptics with Keppra as well as lacosamide .  His Keppra has been weaned off as an outpatient and he is currently on lacosamide  monotherapy at 200 mg twice daily.  He has not missed any doses.  Past History   Past Medical History:  Diagnosis Date   CVA (cerebral vascular accident) (HCC)    Hypertension     Past Surgical History:  Procedure Laterality Date   TOTAL HIP ARTHROPLASTY Right     Family History: Family History  Problem Relation Age of Onset   Hypertension Mother     Social History  reports that he has been smoking. He has been exposed to tobacco smoke. He has never used smokeless tobacco. He reports that he does not drink alcohol and does not use drugs.  No Known Allergies  Medications   Current Facility-Administered Medications:    [START ON 11/06/2023]  stroke: early stages of recovery book, , Does not apply, Once, Lanetta Pion, MD   acetaminophen  (TYLENOL ) tablet 650 mg, 650 mg, Oral, Q4H PRN **OR** acetaminophen  (TYLENOL ) 160 MG/5ML solution 650  mg, 650 mg, Per Tube, Q4H PRN **OR** acetaminophen  (TYLENOL ) suppository 650 mg, 650 mg, Rectal, Q4H PRN, Duncan, Hazel V, MD   [START ON 11/06/2023] amLODipine  (NORVASC ) tablet 10 mg, 10 mg, Oral, Daily, Althia Atlas, MD   aspirin  EC tablet 81 mg, 81 mg, Oral, Daily, Lanetta Pion, MD, 81 mg at 11/05/23 0848   [START ON 11/06/2023] atorvastatin  (LIPITOR) tablet 40 mg, 40 mg, Oral, Daily, Althia Atlas, MD   enoxaparin  (LOVENOX ) injection 50 mg, 0.5 mg/kg, Subcutaneous, Q24H, Lanetta Pion, MD, 50 mg at 11/05/23 0848   hydrALAZINE  (APRESOLINE ) tablet 50 mg, 50 mg, Oral, Q6H PRN **OR** hydrALAZINE  (APRESOLINE ) injection 10 mg, 10 mg, Intravenous, Q6H PRN, Althia Atlas, MD   iron sucrose (VENOFER) 300 mg in sodium chloride  0.9 % 250 mL IVPB, 300 mg, Intravenous, Daily, Althia Atlas, MD   lacosamide  (VIMPAT ) tablet 200 mg, 200 mg, Oral, BID, Althia Atlas, MD, 200 mg at 11/05/23 1104   LORazepam  (ATIVAN ) injection 2 mg, 2 mg, Intravenous, Q5 Min x 3 PRN, Althia Atlas, MD   magnesium  sulfate IVPB 4 g 100 mL, 4 g, Intravenous, Once, Althia Atlas, MD   polyethylene glycol (MIRALAX  / GLYCOLAX ) packet 17 g, 17 g, Oral, Daily, Althia Atlas, MD   potassium chloride  SA (KLOR-CON  M) CR tablet 40 mEq, 40 mEq, Oral, Q4H, Althia Atlas, MD, 40 mEq at 11/05/23 1447  senna (SENOKOT) tablet 17.2 mg, 2 tablet, Oral, QHS PRN, Althia Atlas, MD  Vitals   Vitals:   11/05/23 0139 11/05/23 0535 11/05/23 0700 11/05/23 1135  BP: (!) 136/92 137/81 133/80 133/66  Pulse: 78 69 67 74  Resp:   17 17  Temp: 98.4 F (36.9 C) 97.7 F (36.5 C) 97.7 F (36.5 C) 97.8 F (36.6 C)  TempSrc: Oral Oral Oral   SpO2: 100% 100% 93% 99%  Weight:        Body mass index is 31.85 kg/m.  Physical Exam   Constitutional: Appears well-developed and well-nourished.    Neurologic Examination    Neuro: Mental Status: Patient is awake, alert, oriented to person, place, he gives the month as March He has mild difficulty  with naming Cranial Nerves: II: Visual Fields are full. Pupils are equal, round, and reactive to light.   III,IV, VI: EOMI without ptosis or diploplia.  V: Facial sensation is symmetric to temperature VII: Facial movement is symmetric.  VIII: hearing is intact to voice X: Uvula elevates symmetrically XII: tongue is midline without atrophy or fasciculations.  Motor: He has mild right pronator drift with 5 -/5 weakness of the right upper extremity and 4+/5 weakness of the right lower extremity Sensory: Sensation is symmetric to light touch and temperature in the arms and legs. Cerebellar: No clear ataxia, but he performs slower with right than left       Labs/Imaging/Neurodiagnostic studies   Magnesium  1.1.  CBC:  Recent Labs  Lab 11/04/23 2228  WBC 6.7  NEUTROABS 3.0  HGB 12.2*  HCT 41.2  MCV 74.9*  PLT 286   Basic Metabolic Panel:  Lab Results  Component Value Date   NA 136 11/05/2023   K 3.1 (L) 11/05/2023   CO2 23 11/05/2023   GLUCOSE 90 11/05/2023   BUN 14 11/05/2023   CREATININE 1.04 11/05/2023   CALCIUM  9.1 11/05/2023   GFRNONAA >60 11/05/2023   GFRAA >60 09/20/2017   Lipid Panel:  Lab Results  Component Value Date   LDLCALC 38 11/05/2023   HgbA1c:  Lab Results  Component Value Date   HGBA1C 5.1 11/04/2023   Urine Drug Screen:     Component Value Date/Time   LABOPIA NONE DETECTED 03/31/2023 0605   COCAINSCRNUR NONE DETECTED 03/31/2023 0605   LABBENZ NONE DETECTED 03/31/2023 0605   AMPHETMU NONE DETECTED 03/31/2023 0605   THCU NONE DETECTED 03/31/2023 0605   LABBARB NONE DETECTED 03/31/2023 0605    Alcohol Level     Component Value Date/Time   [facility] <15 11/04/2023 2228   INR  Lab Results  Component Value Date   INR 1.1 11/05/2023   APTT  Lab Results  Component Value Date   APTT 24 11/05/2023   AED levels: No results found for: "PHENYTOIN", "ZONISAMIDE", "LAMOTRIGINE", "LEVETIRACETA"    MRI Brain(Personally reviewed): No acute  stroke  ASSESSMENT   [name] is a 73 y.o. male with an episode of behavioral arrest with postictal confusion.  With stroke, aphasia is not uncommon, but true behavioral arrest is very uncommon.  From his wife's description, I think that partial seizure is much more likely than TIA.  Given that he is fairly severely hypomagnesemic, I would favor addressing this rather than adjusting his antiepileptics.  He had been started on Plavix  on admission, and we can stop this at this time.  RECOMMENDATIONS  Continue aspirin  and statin for secondary stroke prevention No need for stroke workup, I will cancel the echocardiogram  No need for EEG as it is unlikely to change management Continue lacosamide  200 mg twice daily Neurology will be available as needed. ______________________________________________________________________    Flint Hummer, MD [facility]

## 2023-11-05 NOTE — Assessment & Plan Note (Signed)
 Hold antihypertensive for permissive hypertension

## 2023-11-05 NOTE — Progress Notes (Signed)
 Anticoagulation monitoring(Lovenox ):  72 yo male ordered Lovenox  40 mg Q24h    Filed Weights   11/04/23 2225  Weight: 100.7 kg (222 lb 0.1 oz)   BMI 31.9    Lab Results  Component Value Date   CREATININE 1.04 11/04/2023   CREATININE 1.08 03/31/2023   CREATININE 1.07 01/27/2023   CrCl cannot be calculated (Unknown ideal weight.). Hemoglobin & Hematocrit     Component Value Date/Time   HGB 12.2 (L) 11/04/2023 2228   HCT 41.2 11/04/2023 2228     Per Protocol for Patient with estCrcl > 30 ml/min and BMI > 30, will transition to Lovenox  50 mg Q24h.

## 2023-11-05 NOTE — Progress Notes (Signed)
 [facility] Progress Note  Patient: Mark Durham    YNW:[id number]  DOA: 11/04/2023     Date of Service: the patient was seen and examined on 11/05/2023  Chief Complaint  Patient presents with   Altered Mental Status   Brief hospital course: Mark Durham is a 73 y.o. 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  He was previously at his baseline.  CT head code stroke protocol was negative.  Patient was seen by teleneuro at which time NIHSS was 2.  Advanced imaging and tPA were not recommended due to nondisabling symptoms and no signs consistent with LVO.  At the time of my evaluation, patient feels like he is back to his baseline. Additional ED course: Vitals were within normal limits.  Labs including CBC, CMP, EtOH notable for mild hypokalemia of 3.2 with baseline hemoglobin of 12.2. EKG, personally viewed and interpreted showing sinus at 76 Chest x-ray clear MRI brain: . No acute intracranial abnormality. 2. Chronic left parietal infarct. 3. Underlying age-related cerebral atrophy with moderate chronic microvascular ischemic disease. 4. Ventricular prominence somewhat out of proportion to cortical sulcation. While this finding may be related to atrophy, a degree of NPH could potentially have this appearance, and could be considered in the correct clinical setting. 5. 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 Correlation with pituitary function tests suggested. No further imaging evaluation is necessary.   Patient treated with aspirin  325 mg Hospitalist consulted for admission   Assessment and Plan:  # TIA  (transient ischemic attack) but less likely, most likely symptoms were due to seizures as per neurologist. 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 PT/OT eval done, recommended outpatient referral for physical therapy Continue aspirin  81 mg p.o. daily, Plavix  was started but he does not need it so it has been discontinued. LDL is very low, patient was on Lipitor 80 mg, so decreased to 40 mg p.o. daily.  Repeat lipid profile after 3 to 6 months and follow with PCP and neurologist as an outpatient. Keep normotensive. 2D echocardiogram was ordered on admission, as per neurology patient does not need a 2D echocardiogram for a stroke but we will check on the CHF, due to mild lower extremity edema.   # Seizure disorder Continue Vimpat  200 mg p.o. twice daily Continue seizure precautions Use Ativan  as needed for seizures   # Hypomagnesemia, mag repleted. # Hypokalemia, potassium repleted. Monitor electrolytes and replete as needed.  # Iron deficiency anemia, transferrin saturation 6% Started Venofer 300 mg IV daily x 2 doses, oral iron supplement with vitamin C on discharge  # HTN (hypertension) Resumed amlodipine  10 mg p.o. daily home dose from tomorrow Use hydralazine  as needed Monitor BP and titrate medications accordingly      Body mass index is 31.85 kg/m.  Interventions:  Diet: Heart healthy diet DVT Prophylaxis: Subcutaneous Lovenox    Advance goals of care discussion: Full code  Family Communication: family was present at bedside, at  the time of interview.  The pt provided permission to discuss medical plan with the family. Opportunity was given to ask question and all questions were answered satisfactorily.   Disposition:  Pt is from home, admitted with possible seizures, still has  low electrolytes, which precludes a safe discharge. Discharge to home, when stable, most likely tomorrow a.m.  Subjective: No significant events overnight, patient denied any new neurological complaints, no headache or dizziness, no shortness of breath, no chest pain or palpitations.  Physical Exam: General: NAD, lying comfortably Appear in no distress, affect appropriate Eyes: PERRLA ENT: Oral Mucosa Clear, moist  Neck: no JVD,  Cardiovascular: S1 and S2 Present, no Murmur,  Respiratory: good respiratory effort, Bilateral Air entry equal and Decreased, no Crackles, no wheezes Abdomen: Bowel Sound present, Soft and no tenderness,  Skin: no rashes Extremities: mild Pedal edema, no calf tenderness Neurologic: without any new focal findings, residual weakness on the right side due to prior stroke Gait not checked due to patient safety concerns  Vitals:   11/05/23 0139 11/05/23 0535 11/05/23 0700 11/05/23 1135  BP: (!) 136/92 137/81 133/80 133/66  Pulse: 78 69 67 74  Resp:   17 17  Temp: 98.4 F (36.9 C) 97.7 F (36.5 C) 97.7 F (36.5 C) 97.8 F (36.6 C)  TempSrc: Oral Oral Oral   SpO2: 100% 100% 93% 99%  Weight:        Intake/Output Summary (Last 24 hours) at 11/05/2023 1501 Last data filed at 11/05/2023 1200 Gross per 24 hour  Intake --  Output 300 ml  Net -300 ml   Filed Weights   11/04/23 2225  Weight: 100.7 kg    Data Reviewed: I have personally reviewed and interpreted daily labs, tele strips, imagings as discussed above. I reviewed all nursing notes, pharmacy notes, vitals, pertinent old records I have discussed plan of care as described above with RN and patient/family.  CBC: Recent Labs  Lab 11/04/23 2228  WBC 6.7  NEUTROABS 3.0  HGB 12.2*  HCT 41.2  MCV 74.9*  PLT 286   Basic Metabolic Panel: Recent Labs  Lab 11/04/23 2228 11/05/23 1219  NA 142 136  K 3.2* 3.1*  CL 105 102  CO2 23 23  GLUCOSE 104* 90  BUN 13 14  CREATININE 1.04 1.04   CALCIUM  9.2 9.1  MG  --  1.1*  PHOS  --  2.5    Studies: MR BRAIN WO CONTRAST Result Date: 11/05/2023 CLINICAL DATA:  Initial evaluation for acute neuro deficit, stroke suspected. EXAM: MRI HEAD WITHOUT CONTRAST TECHNIQUE: Multiplanar, multiecho pulse sequences of the brain and surrounding structures were obtained without intravenous contrast. COMPARISON:  Prior CT from 11/04/2023 as well as previous MRI from 01/06/2023. FINDINGS: Brain: Mild age-related cerebral atrophy. Patchy and confluent T2/FLAIR hyperintensity involving the periventricular and deep white matter both cerebral hemispheres, most characteristic of chronic microvascular ischemic disease, moderately advanced in nature. Encephalomalacia of and gliosis involving the left parietal lobe, consistent with a chronic infarct. Minimal chronic hemosiderin staining noted at this location. Few additional small remote lacunar infarcts noted about the hemispheric cerebral white matter and left thalamus. No abnormal foci of restricted diffusion to suggest acute or subacute ischemia. Gray-white matter differentiation maintained. No acute intracranial hemorrhage. No other significant chronic intracranial blood products. Approximate 2.5 cm probable benign arachnoid cyst noted at the superior cerebellar cistern. Secondary flattening of the cerebellar vermis inferiorly. No other mass lesion or mass effect. No midline shift. 1 cm cystic lesion within  the posterior pituitary gland again noted, stable. Vascular: Major intracranial vascular flow voids are maintained. Basilar tip ectatic in appearance, similar to prior. Skull and upper cervical spine: Craniocervical junction within normal limits. Bone marrow signal intensity heterogeneous without worrisome osseous lesion. Small lipoma noted at the forehead. Scalp soft tissues demonstrate no other acute finding. Sinuses/Orbits: Sequelae of prior ocular lens replacement with axial myopia at the right globe. Globes  orbital soft tissues demonstrate no acute finding. Paranasal sinuses are largely clear. No mastoid effusion. Other: None. IMPRESSION: 1. No acute intracranial abnormality. 2. Chronic left parietal infarct. 3. Underlying age-related cerebral atrophy with moderate chronic microvascular ischemic disease. 4. Ventricular prominence somewhat out of proportion to cortical sulcation. While this finding may be related to atrophy, a degree of NPH could potentially have this appearance, and could be considered in the correct clinical setting. 5. 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 Correlation with pituitary function tests suggested. No further imaging evaluation is necessary. This follows ACR consensus guidelines: Management of Incidental Pituitary Findings on CT, MRI and F18-FDG PET: A White Paper of the ACR Incidental Findings Committee. J Am Coll Radiol 2018; 15: 966-72. 6. 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 Electronically Signed   By: Virgia Griffins M.D.   On: 11/05/2023 02:04   DG Chest Port 1 View Result Date: 11/04/2023 CLINICAL DATA:  Altered level of consciousness EXAM: PORTABLE CHEST 1 VIEW COMPARISON:  01/09/2023 FINDINGS: Single frontal view of the chest demonstrates a stable cardiac silhouette. No acute airspace disease, effusion, or pneumothorax. No acute bony abnormalities. IMPRESSION: 1. No acute intrathoracic process. Electronically Signed   By: Bobbye Burrow M.D.   On: 11/04/2023 23:38   CT HEAD CODE STROKE WO CONTRAST` Result Date: 11/04/2023 CLINICAL DATA:  Code stroke. Initial evaluation for acute neuro deficit, stroke suspected. EXAM: CT HEAD WITHOUT CONTRAST TECHNIQUE: Contiguous axial images were obtained from the base of the skull through the vertex without intravenous contrast. RADIATION DOSE REDUCTION: This exam was performed according to the  departmental dose-optimization program which includes automated exposure control, adjustment of the mA and/or kV according to patient size and/or use of iterative reconstruction technique. COMPARISON:  Prior CT from 03/31/2023 as well as earlier studies. FINDINGS: Brain: Generalized age-related cerebral atrophy with moderate chronic microvascular ischemic disease. No acute intracranial hemorrhage. No acute large vessel territory infarct. 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 Small benign appearing cyst within the pituitary gland noted as well, also stable. No other mass lesion or mass effect. Ventricular prominence somewhat out of proportion to cortical sulcation again noted, stable from prior. No extra-axial fluid collection. Vascular: No convincing abnormal hyperdense vessel. Scattered calcified atherosclerosis present at the skull base. Skull: Scalp soft tissues demonstrate no acute finding. Small lipoma noted at the central forehead. Calvarium intact. Sinuses/Orbits: Globes orbital soft tissues demonstrate no acute finding. Prior ocular lens replacement on the right. Paranasal sinuses are largely clear. No mastoid effusion. Other: None. ASPECTS [facility] Stroke Program Early CT Score) - Ganglionic level infarction (caudate, lentiform nuclei, internal capsule, insula, M1-M3 cortex): 7 - Supraganglionic infarction (M4-M6 cortex): 3 Total score (0-10 with 10 being normal): 10 IMPRESSION: 1. No acute intracranial abnormality. 2. ASPECTS is 10. 3. Age-related cerebral volume loss with moderate chronic microvascular ischemic disease. 4. Mild ventricular prominence somewhat out of proportion to cortical sulcation. While these findings may be  related to central atrophy, possible changes of NPH could also have this appearance, and could be considered in the correct clinical setting. Results were called by telephone at the time of interpretation on 11/04/2023 at 10:52 pm to  provider Bryson Carbine , who verbally acknowledged these results. Electronically Signed   By: Virgia Griffins M.D.   On: 11/04/2023 22:55    Scheduled Meds:  [START ON 11/06/2023]  stroke: early stages of recovery book   Does not apply Once   aspirin  EC  81 mg Oral Daily   atorvastatin   80 mg Oral Daily   clopidogrel   75 mg Oral Daily   enoxaparin  (LOVENOX ) injection  0.5 mg/kg Subcutaneous Q24H   lacosamide   200 mg Oral BID   potassium chloride   40 mEq Oral Q4H   Continuous Infusions:  iron sucrose     magnesium  sulfate bolus IVPB     PRN Meds: acetaminophen  **OR** acetaminophen  (TYLENOL ) oral liquid 160 mg/5 mL **OR** acetaminophen   Time spent: 55 minutes  Author: Althia Atlas. MD [facility] 11/05/2023 3:01 PM  To reach On-call, see care teams to locate the attending and reach out to them via [email or web address]. If 7PM-7AM, please contact night-coverage If you still have difficulty reaching the attending provider, please page the [facility] (Director on Call) for [facility] on amion for assistance.

## 2023-11-05 NOTE — TOC Progression Note (Signed)
 Transition of Care [facility]) - Progression Note    Patient Details  Name: Mark Durham MRN: [id number] Date of Birth: 04/17/1951  Transition of Care [facility]) CM/SW Contact  Alexandra Ice, RN Phone Number: 11/05/2023, 2:32 PM  Clinical Narrative:    Sent referral for outpatient PT to [facility] outpatient Rehab services main campus, per patient and spouse choice.         Expected Discharge Plan and Services                                               Social Determinants of Health (SDOH) Interventions SDOH Screenings   Food Insecurity: No Food Insecurity (11/05/2023)  Housing: Unknown (11/05/2023)  Transportation Needs: No Transportation Needs (11/05/2023)  Utilities: Not At Risk (11/05/2023)  Financial Resource Strain: Low Risk  (07/28/2022)   Received from [facility], [facility]  Social Connections: Unknown (11/05/2023)  Tobacco Use: High Risk (11/04/2023)  Health Literacy: Medium Risk (08/31/2022)   Received from [facility], [facility]    Readmission Risk Interventions     No data to display

## 2023-11-05 NOTE — Plan of Care (Signed)
  Problem: Education: Goal: Knowledge of disease or condition will improve Outcome: Progressing Goal: Knowledge of secondary prevention will improve (MUST DOCUMENT ALL) Outcome: Progressing Goal: Knowledge of patient specific risk factors will improve (DELETE if not current risk factor) Outcome: Progressing   Problem: Ischemic Stroke/TIA Tissue Perfusion: Goal: Complications of ischemic stroke/TIA will be minimized Outcome: Progressing   Problem: Coping: Goal: Will verbalize positive feelings about self Outcome: Progressing Goal: Will identify appropriate support needs Outcome: Progressing   Problem: Self-Care: Goal: Ability to participate in self-care as condition permits will improve Outcome: Progressing Goal: Verbalization of feelings and concerns over difficulty with self-care will improve Outcome: Progressing Goal: Ability to communicate needs accurately will improve Outcome: Progressing   Problem: Nutrition: Goal: Risk of aspiration will decrease Outcome: Progressing Goal: Dietary intake will improve Outcome: Progressing   Problem: Health Behavior/Discharge Planning: Goal: Ability to manage health-related needs will improve Outcome: Progressing

## 2023-11-05 NOTE — Progress Notes (Signed)
 OT Cancellation Note  Patient Details Name: KOUA GROWNEY MRN: [id number] DOB: [birth year]   Cancelled Treatment:    Reason Eval/Treat Not Completed: OT screened, no needs identified, will sign off. Discussed with PT - pt presenting at functional baseline for mobility and ADLs. OT to complete orders, if new needs arise, please re-consult.    Zyler Hyson L. Marquavious Nazar, OTR/L  11/05/23, 10:06 AM

## 2023-11-05 NOTE — Assessment & Plan Note (Signed)
 History of CVA with poststroke epilepsy Permissive hypertension for first 24-48 hrs post stroke onset: Prn Labetalol IV or Vasotec IV If BP greater than 220/120  Statins for LDL goal less than 70 ASA 81mg  daily, Plavix  75mg  daily x 3 weeks then monotherapy thereafter Telemetry, echo and MRI Avoid dextrose  containing fluids, Maintain euglycemia, euthermia Neuro checks q4 hrs x 24 hrs and then per shift Head of bed 30 degrees Physical therapy/Occupational therapy/Speech therapy if failed dysphagia screen Neurology consult to follow

## 2023-11-06 DIAGNOSIS — G459 Transient cerebral ischemic attack, unspecified: Secondary | ICD-10-CM | POA: Diagnosis not present

## 2023-11-06 LAB — CBC
HCT: 38.3 % — ABNORMAL LOW (ref 39.0–52.0)
Hemoglobin: 11.8 g/dL — ABNORMAL LOW (ref 13.0–17.0)
MCH: 22.1 pg — ABNORMAL LOW (ref 26.0–34.0)
MCHC: 30.8 g/dL (ref 30.0–36.0)
MCV: 71.7 fL — ABNORMAL LOW (ref 80.0–100.0)
Platelets: 231 10*3/uL (ref 150–400)
RBC: 5.34 MIL/uL (ref 4.22–5.81)
RDW: 16.6 % — ABNORMAL HIGH (ref 11.5–15.5)
WBC: 5.2 10*3/uL (ref 4.0–10.5)
nRBC: 0 % (ref 0.0–0.2)

## 2023-11-06 LAB — BASIC METABOLIC PANEL WITH GFR
Anion gap: 10 (ref 5–15)
BUN: 11 mg/dL (ref 8–23)
CO2: 24 mmol/L (ref 22–32)
Calcium: 8.9 mg/dL (ref 8.9–10.3)
Chloride: 106 mmol/L (ref 98–111)
Creatinine, Ser: 0.76 mg/dL (ref 0.61–1.24)
GFR, Estimated: >60 mL/min (ref >60)
Glucose, Bld: 93 mg/dL (ref 70–99)
Potassium: 3.2 mmol/L — ABNORMAL LOW (ref 3.5–5.1)
Sodium: 140 mmol/L (ref 135–145)

## 2023-11-06 LAB — MAGNESIUM: Magnesium: 2 mg/dL (ref 1.7–2.4)

## 2023-11-06 LAB — VITAMIN B12: Vitamin B-12: 293 pg/mL (ref 180–914)

## 2023-11-06 LAB — PHOSPHORUS: Phosphorus: 2.4 mg/dL — ABNORMAL LOW (ref 2.5–4.6)

## 2023-11-06 MED ORDER — K PHOS MONO-SOD PHOS DI & MONO 155-852-130 MG PO TABS
500.0000 mg | ORAL_TABLET | Freq: Once | ORAL | Status: AC
  Administered 2023-11-06: 500 mg via ORAL
  Filled 2023-11-06: qty 2

## 2023-11-06 MED ORDER — ASCORBIC ACID 500 MG PO TABS
500.0000 mg | ORAL_TABLET | Freq: Every day | ORAL | 2 refills | Status: AC
Start: 2023-11-06 — End: 2024-02-04

## 2023-11-06 MED ORDER — POTASSIUM CHLORIDE CRYS ER 20 MEQ PO TBCR
40.0000 meq | EXTENDED_RELEASE_TABLET | ORAL | Status: AC
Start: 2023-11-06 — End: 2023-11-06
  Administered 2023-11-06 (×2): 40 meq via ORAL
  Filled 2023-11-06 (×2): qty 2

## 2023-11-06 MED ORDER — POLYSACCHARIDE IRON COMPLEX 150 MG PO CAPS: 150.0000 mg | ORAL_CAPSULE | Freq: Every day | ORAL | 0 refills | Status: DC

## 2023-11-06 MED ORDER — CYANOCOBALAMIN 1000 MCG/ML IJ SOLN
1000.0000 ug | Freq: Once | INTRAMUSCULAR | Status: AC
  Administered 2023-11-06: 1000 ug via INTRAMUSCULAR
  Filled 2023-11-06: qty 1

## 2023-11-06 MED ORDER — VITAMIN B-12 1000 MCG PO TABS: 1000.0000 ug | ORAL_TABLET | Freq: Every day | ORAL | 0 refills | Status: AC

## 2023-11-06 MED ORDER — MAGNESIUM OXIDE 400 MG PO TABS: 400.0000 mg | ORAL_TABLET | Freq: Every day | ORAL | 0 refills | Status: AC

## 2023-11-06 NOTE — Discharge Summary (Signed)
 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  Recommendations for Outpatient Follow-up:  F/u with PCP in 1wk F/u with Neuro as per schedule Follow up LABS/TEST: BMP and magnesium  level after 1 to 2 weeks   Follow-up Information     [place] Follow up in 1 week(s).   Contact information: [address] [place] [address] [place] [address] [phone]                Diet recommendation: Cardiac diet  Activity: The patient is advised to gradually reintroduce usual activities, as tolerated  Discharge Condition: stable  Code Status: Full code   History of present illness: As per the H and P dictated on admission. Hospital Course:  LENTON NGHIEM is a 73 y.o. 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  He was previously at his baseline.  CT head code stroke protocol was negative.  Patient was seen by teleneuro at which time NIHSS was 2.  Advanced imaging and tPA were not recommended due to nondisabling symptoms and no signs consistent with LVO.  At the time of my evaluation, patient feels like he is back to his baseline. Additional ED course: Vitals were within normal limits.  Labs including CBC, CMP, EtOH notable for mild hypokalemia of 3.2 with baseline hemoglobin of  12.2. EKG, personally viewed and interpreted showing sinus at 76 Chest x-ray clear MRI brain: . No acute intracranial abnormality. 2. Chronic left parietal infarct. 3. Underlying age-related cerebral atrophy with moderate chronic microvascular ischemic disease. 4. Ventricular prominence somewhat out of proportion to cortical sulcation. While this finding may be related to atrophy, a degree of NPH could potentially have this appearance, and could be considered in the correct clinical setting. 5. 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 Correlation with pituitary function tests suggested. No further imaging evaluation is necessary.   Patient treated with aspirin  325 mg Hospitalist consulted for admission     Assessment and Plan:   # TIA (transient ischemic attack) but less likely, most likely symptoms were due to seizures as per neurologist. 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 PT/OT eval done, recommended outpatient referral for physical therapy Continue aspirin  81 mg p.o. daily, Plavix  was started but he does not need it so it has been discontinued. LDL is very low, patient was on Lipitor 80 mg, which was resumed as per neurology.  Recommend to repeat lipid profile and decrease dose as an outpatient.  Follow-up with neurology. Kept normotensive.  TTE was canceled by neurology.     #  Seizure disorder Continue Vimpat  200 mg p.o. twice daily Seen by neurology, recommended continue same dose.  Seizure activity could be secondary to hypomagnesemia.  Replete electrolytes.  Cleared to discharge and follow-up as an outpatient.    # Hypomagnesemia, mag repleted.  Resolved, patient was discharged on Mag-Ox  400 mg p.o. daily.  Repeat magnesium  level after few weeks # Hypokalemia, potassium repleted.  Resolved # Iron deficiency anemia, transferrin saturation 6% S/p Venofer 300 mg IV daily x 2 doses, oral iron supplement with vitamin C on discharge # HTN (hypertension): Resumed amlodipine  10 mg p.o. daily.  Monitor BP at home and follow with PCP.    Body mass index is 31.85 kg/m.  Nutrition Interventions:  - Patient was instructed, not to drive, operate heavy machinery, perform activities at heights, swimming or participation in water activities or provide baby sitting services while on Pain, Sleep and Anxiety Medications; until his outpatient Physician has advised to do so again.  - Also recommended to not to take more than prescribed Pain, Sleep and Anxiety Medications.  Patient was seen by physical therapy, who recommended outpatient physical therapy referral, which was arranged. On the day of the discharge the patient's vitals were stable, and no other acute medical condition were reported by patient. the patient was felt safe to be discharge at Home with outpatient physical therapy referral.  Consultants: Neurology Procedures: None  Discharge Exam: General: Appear in no distress, no Rash; Oral Mucosa Clear, moist. Cardiovascular: S1 and S2 Present, no Murmur, Respiratory: normal respiratory effort, Bilateral Air entry present and no Crackles, no wheezes Abdomen: Bowel Sound present, Soft and no tenderness, no hernia Extremities: no Pedal edema, no calf tenderness Neurology: alert and oriented to time, place, and person affect appropriate.  Filed Weights   11/04/23 2225  Weight: 100.7 kg   Vitals:   11/05/23 1935 11/06/23 0855  BP: 121/73 124/78  Pulse: 65 83  Resp: 16 19  Temp: 98.7 F (37.1 C) 98.4 F (36.9 C)  SpO2: 96% 99%    DISCHARGE MEDICATION: Allergies as of 11/06/2023   No Known Allergies      Medication List     TAKE these medications    acetaminophen   500 MG tablet Commonly known as: TYLENOL  Take 1,000 mg by mouth every 6 (six) hours as needed for moderate pain (as needed for pain). Take 2 tablets 3x per day as needed for pain   amLODipine  10 MG tablet Commonly known as: NORVASC  Take 10 mg by mouth daily.   ascorbic acid 500 MG tablet Commonly known as: VITAMIN C Take 1 tablet (500 mg total) by mouth daily.   aspirin  EC 81 MG tablet Take 81 mg by mouth daily. Swallow whole.   atorvastatin  80 MG tablet Commonly known as: LIPITOR Take 80 mg by mouth daily. Take 1 tablet by mouth daily   cyanocobalamin 1000 MCG tablet Commonly known as: VITAMIN B12 Take 1 tablet (1,000 mcg total) by mouth daily.   iron polysaccharides 150 MG capsule Commonly known as: NIFEREX Take 1 capsule (150 mg total) by mouth daily.   Lacosamide  100 MG Tabs Take 200 mg by mouth 2 (two) times daily.   magnesium  oxide 400 MG tablet Commonly known as: MAG-OX Take 1 tablet (400 mg total) by mouth daily.   omeprazole 20 MG tablet Commonly known as: PRILOSEC OTC Take 20 mg by mouth daily. Take 1 tablet by mouth daily as needed.   polyethylene glycol 17 g packet Commonly known as: MIRALAX  /  GLYCOLAX  Take 17 g by mouth daily. Drink by mouth daily as needed.   senna 8.6 MG Tabs tablet Commonly known as: SENOKOT Take 2 tablets (17.2 mg total) by mouth at bedtime as needed for mild constipation. Take 2 tablets by mouth nightly.   tadalafil  20 MG tablet Commonly known as: CIALIS  Take 1 tablet (20 mg total) by mouth daily as needed for erectile dysfunction (1 hour prior to sexual activity).       No Known Allergies Discharge Instructions     Call MD for:  difficulty breathing, headache or visual disturbances   Complete by: As directed    Call MD for:  extreme fatigue   Complete by: As directed    Call MD for:  persistant dizziness or light-headedness   Complete by: As directed    Call MD for:  persistant nausea and vomiting   Complete by: As  directed    Call MD for:  severe uncontrolled pain   Complete by: As directed    Call MD for:  temperature >100.4   Complete by: As directed    Diet - low sodium heart healthy   Complete by: As directed    Discharge instructions   Complete by: As directed    F/u with PCP in 1wk F/u with Neuro as per schedule   Increase activity slowly   Complete by: As directed        The results of significant diagnostics from this hospitalization (including imaging, microbiology, ancillary and laboratory) are listed below for reference.    Significant Diagnostic Studies: MR BRAIN WO CONTRAST Result Date: 11/05/2023 CLINICAL DATA:  Initial evaluation for acute neuro deficit, stroke suspected. EXAM: MRI HEAD WITHOUT CONTRAST TECHNIQUE: Multiplanar, multiecho pulse sequences of the brain and surrounding structures were obtained without intravenous contrast. COMPARISON:  Prior CT from 11/04/2023 as well as previous MRI from 01/06/2023. FINDINGS: Brain: Mild age-related cerebral atrophy. Patchy and confluent T2/FLAIR hyperintensity involving the periventricular and deep white matter both cerebral hemispheres, most characteristic of chronic microvascular ischemic disease, moderately advanced in nature. Encephalomalacia of and gliosis involving the left parietal lobe, consistent with a chronic infarct. Minimal chronic hemosiderin staining noted at this location. Few additional small remote lacunar infarcts noted about the hemispheric cerebral white matter and left thalamus. No abnormal foci of restricted diffusion to suggest acute or subacute ischemia. Gray-white matter differentiation maintained. No acute intracranial hemorrhage. No other significant chronic intracranial blood products. Approximate 2.5 cm probable benign arachnoid cyst noted at the superior cerebellar cistern. Secondary flattening of the cerebellar vermis inferiorly. No other mass lesion or mass effect. No midline shift. 1 cm cystic lesion within  the posterior pituitary gland again noted, stable. Vascular: Major intracranial vascular flow voids are maintained. Basilar tip ectatic in appearance, similar to prior. Skull and upper cervical spine: Craniocervical junction within normal limits. Bone marrow signal intensity heterogeneous without worrisome osseous lesion. Small lipoma noted at the forehead. Scalp soft tissues demonstrate no other acute finding. Sinuses/Orbits: Sequelae of prior ocular lens replacement with axial myopia at the right globe. Globes orbital soft tissues demonstrate no acute finding. Paranasal sinuses are largely clear. No mastoid effusion. Other: None. IMPRESSION: 1. No acute intracranial abnormality. 2. Chronic left parietal infarct. 3. Underlying age-related cerebral atrophy with moderate chronic microvascular ischemic disease. 4. Ventricular prominence somewhat out of proportion to cortical sulcation. While this finding may be related to atrophy, a degree of NPH could potentially have this appearance, and could be considered in the correct clinical  setting. 5. 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 Correlation with pituitary function tests suggested. No further imaging evaluation is necessary. This follows ACR consensus guidelines: Management of Incidental Pituitary Findings on CT, MRI and F18-FDG PET: A White Paper of the ACR Incidental Findings Committee. J Am Coll Radiol 2018; 15: 966-72. 6. 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 Electronically Signed   By: Virgia Griffins M.D.   On: 11/05/2023 02:04   DG Chest Port 1 View Result Date: 11/04/2023 CLINICAL DATA:  Altered level of consciousness EXAM: PORTABLE CHEST 1 VIEW COMPARISON:  01/09/2023 FINDINGS: Single frontal view of the chest demonstrates a stable cardiac silhouette. No acute airspace disease, effusion, or pneumothorax. No acute  bony abnormalities. IMPRESSION: 1. No acute intrathoracic process. Electronically Signed   By: Bobbye Burrow M.D.   On: 11/04/2023 23:38   CT HEAD CODE STROKE WO CONTRAST` Result Date: 11/04/2023 CLINICAL DATA:  Code stroke. Initial evaluation for acute neuro deficit, stroke suspected. EXAM: CT HEAD WITHOUT CONTRAST TECHNIQUE: Contiguous axial images were obtained from the base of the skull through the vertex without intravenous contrast. RADIATION DOSE REDUCTION: This exam was performed according to the departmental dose-optimization program which includes automated exposure control, adjustment of the mA and/or kV according to patient size and/or use of iterative reconstruction technique. COMPARISON:  Prior CT from 03/31/2023 as well as earlier studies. FINDINGS: Brain: Generalized age-related cerebral atrophy with moderate chronic microvascular ischemic disease. No acute intracranial hemorrhage. No acute large vessel territory infarct. 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 Small benign appearing cyst within the pituitary gland noted as well, also stable. No other mass lesion or mass effect. Ventricular prominence somewhat out of proportion to cortical sulcation again noted, stable from prior. No extra-axial fluid collection. Vascular: No convincing abnormal hyperdense vessel. Scattered calcified atherosclerosis present at the skull base. Skull: Scalp soft tissues demonstrate no acute finding. Small lipoma noted at the central forehead. Calvarium intact. Sinuses/Orbits: Globes orbital soft tissues demonstrate no acute finding. Prior ocular lens replacement on the right. Paranasal sinuses are largely clear. No mastoid effusion. Other: None. ASPECTS [facility] Stroke Program Early CT Score) - Ganglionic level infarction (caudate, lentiform nuclei, internal capsule, insula, M1-M3 cortex): 7 - Supraganglionic infarction (M4-M6 cortex): 3 Total score (0-10 with 10 being  normal): 10 IMPRESSION: 1. No acute intracranial abnormality. 2. ASPECTS is 10. 3. Age-related cerebral volume loss with moderate chronic microvascular ischemic disease. 4. Mild ventricular prominence somewhat out of proportion to cortical sulcation. While these findings may be related to central atrophy, possible changes of NPH could also have this appearance, and could be considered in the correct clinical setting. Results were called by telephone at the time of interpretation on 11/04/2023 at 10:52 pm to provider Bryson Carbine , who verbally acknowledged these results. Electronically Signed   By: Virgia Griffins M.D.   On: 11/04/2023 22:55    Microbiology: No results found for this or any previous visit (from the past 240 hours).   Labs: CBC: Recent Labs  Lab 11/04/23 2228 11/06/23 0350  WBC 6.7 5.2  NEUTROABS 3.0  --   HGB 12.2* 11.8*  HCT 41.2 38.3*  MCV 74.9* 71.7*  PLT 286 231   Basic Metabolic Panel: Recent Labs  Lab 11/04/23 2228 11/05/23 1219 11/06/23 0350  NA 142 136 140  K 3.2* 3.1* 3.2*  CL 105  102 106  CO2 23 23 24   GLUCOSE 104* 90 93  BUN 13 14 11   CREATININE 1.04 1.04 0.76  CALCIUM  9.2 9.1 8.9  MG  --  1.1* 2.0  PHOS  --  2.5 2.4*   Liver Function Tests: Recent Labs  Lab 11/04/23 2228  AST 17  ALT 16  ALKPHOS 99  BILITOT 1.0  PROT 8.1  ALBUMIN 4.0   No results for input(s): "LIPASE", "AMYLASE" in the last 168 hours. No results for input(s): "AMMONIA" in the last 168 hours. Cardiac Enzymes: No results for input(s): "CKTOTAL", "CKMB", "CKMBINDEX", "TROPONINI" in the last 168 hours. BNP (last 3 results) No results for input(s): "BNP" in the last 8760 hours. CBG: Recent Labs  Lab 11/04/23 2251  GLUCAP 107*    Time spent: 35 minutes  Signed:  Althia Atlas  [facility] 11/06/2023 11:33 AM

## 2023-11-06 NOTE — TOC Transition Note (Signed)
 Transition of Care [facility]) - Discharge Note   Patient Details  Name: Mark Durham MRN: [id number] Date of Birth: June 19, 1951  Transition of Care [facility]) CM/SW Contact:  Alexandra Ice, RN Phone Number: 11/06/2023, 10:55 AM   Clinical Narrative:    Patient to discharge today to home. Outpatient physical therapy referral sent to [facility] outpatient rehab services per patient and spouse request. No additional TOC identified.   Final next level of care: Home/Self Care Barriers to Discharge: Barriers Resolved   Patient Goals and CMS Choice Patient states their goals for this hospitalization and ongoing recovery are:: get better and go home CMS Medicare.gov Compare Post Acute Care list provided to:: Patient Choice offered to / list presented to : Patient [place] ownership interest in [facility].provided to:: Patient    Discharge Placement                Patient to be transferred to facility by: Spouse Name of family member notified: Delinda Patient and family notified of of transfer: 11/06/23  Discharge Plan and Services Additional resources added to the After Visit Summary for                    DME Agency: NA       HH Arranged: NA          Social Drivers of Health (SDOH) Interventions SDOH Screenings   Food Insecurity: No Food Insecurity (11/05/2023)  Housing: Unknown (11/05/2023)  Transportation Needs: No Transportation Needs (11/05/2023)  Utilities: Not At Risk (11/05/2023)  Financial Resource Strain: Low Risk  (07/28/2022)   Received from [facility], [facility]  Social Connections: Unknown (11/05/2023)  Tobacco Use: High Risk (11/04/2023)  Health Literacy: Medium Risk (08/31/2022)   Received from [facility], [facility]     Readmission Risk Interventions     No data to display

## 2023-11-06 NOTE — Plan of Care (Signed)
  Problem: Education: Goal: Knowledge of disease or condition will improve Outcome: Progressing   Problem: Ischemic Stroke/TIA Tissue Perfusion: Goal: Complications of ischemic stroke/TIA will be minimized Outcome: Progressing   Problem: Coping: Goal: Will identify appropriate support needs Outcome: Progressing   Problem: Self-Care: Goal: Ability to communicate needs accurately will improve Outcome: Progressing   Problem: Nutrition: Goal: Risk of aspiration will decrease Outcome: Progressing Goal: Dietary intake will improve Outcome: Progressing   Problem: Education: Goal: Knowledge of General Education information will improve Description: Including pain rating scale, medication(s)/side effects and non-pharmacologic comfort measures Outcome: Progressing   Problem: Nutrition: Goal: Adequate nutrition will be maintained Outcome: Progressing

## 2023-11-06 NOTE — Care Management Obs Status (Signed)
 MEDICARE OBSERVATION STATUS NOTIFICATION   Patient Details  Name: Mark Durham MRN: [id number] Date of Birth: 13-Dec-1950   Medicare Observation Status Notification Given:  Yes    Holland Lundborg, RN 11/06/2023, 10:10 AM

## 2023-11-06 NOTE — Progress Notes (Signed)
 Mobility Specialist - Progress Note   11/06/23 1357  Mobility  Activity Ambulated with assistance to bathroom  Level of Assistance Contact guard assist, steadying assist  Assistive Device None  Distance Ambulated (ft) 15 ft  Activity Response Tolerated well  Mobility Referral Yes  Mobility visit 1 Mobility  Mobility Specialist Start Time (ACUTE ONLY) 1344  Mobility Specialist Stop Time (ACUTE ONLY) 1356  Mobility Specialist Time Calculation (min) (ACUTE ONLY) 12 min   Pt sitting in WC requesting to go to bathroom upon arrival. Pt STS and ambulates to/from bathroom CGA. Pt returns to [facility] with needs in reach and NT present.   Wash Hack  Mobility Specialist  11/06/23 1:59 PM

## 2024-05-30 NOTE — Progress Notes (Signed)
 Chief Complaint  Patient presents with  . Hypertension    Subjective  Mark Durham is a 73 y.o. male who presents for Hypertension HPI History of Present Illness Mark Durham is a 73 year old male who presents for medication management and lab work.  He is experiencing issues with obtaining his prescribed medications, specifically iron  and potassium chloride , from [facility], which has been out of stock for two weeks. Lab Results  Component Value Date   IRON  70 12/20/2022   TIBC 490 (H) 12/20/2022   FERRITIN 8 (L) 11/23/2021   Lab Results  Component Value Date   K 3.1 (L) 02/20/2024    He is currently taking magnesium  supplements, though the exact dosage was not specified.  Magnesium   Date Value Ref Range Status  02/20/2024 1.4 (L) 1.8 - 2.5 mg/dL Final  95/95/7981 1.8 1.8 - 2.5 mg/dL Final    He is also on amlodipine  for blood pressure, BP Readings from Last 3 Encounters:  05/30/24 135/86  02/20/24 137/89  11/18/23 111/68    atorvastatin  for cholesterol, B12 supplements, Prilosec, and over-the-counter vitamin C for immune health.  He has previously been told he has low magnesium  levels. He is also taking potassium chloride , though he has faced difficulties in obtaining it from the pharmacy.  No new concerns or problems were reported during the review of symptoms.  Review of Systems  Patient Active Problem List  Diagnosis  . GERD (gastroesophageal reflux disease)  . History of adenomatous polyp of colon  . History of Barrett's esophagus  . Chronic pain of right knee  . Chronic right hip pain  . Primary osteoarthritis of right hip  . S/P hip replacement, right  . CAP (community acquired pneumonia)  . HLD (hyperlipidemia)  . HTN (hypertension)  . Hypokalemia  . Hypomagnesemia  . Hypophosphatemia  . Obesity (BMI 30-39.9), unspecified  . Severe sepsis (CMS/HHS-HCC)  . Stroke (CMS/HHS-HCC)  . Tobacco use  . UTI (urinary tract infection)  . Localized  swelling of finger of left hand  . Seizure disorder as sequela of cerebrovascular accident (CMS/HHS-HCC)  . Pain of left hand  . History of CVA (cerebrovascular accident)    Outpatient Medications Prior to Visit  Medication Sig Dispense Refill  . ascorbic acid , vitamin C, (VITAMIN C) 500 MG tablet Take 500 mg by mouth once daily    . aspirin  81 MG EC tablet Take 81 mg by mouth once daily    . ibuprofen 200 mg Cap Take 1 tablet by mouth as needed    . IFEREX 150 150 mg iron  capsule Take 1 capsule (150 mg total) by mouth once daily 90 capsule 3  . lacosamide  (VIMPAT ) 200 mg tablet Take 200 mg by mouth 2 (two) times daily    . magnesium  oxide (MAG-OX) 400 mg (241.3 mg magnesium ) tablet Take 400 mg by mouth once daily    . melatonin 3 mg Cap Take by mouth    . potassium chloride  (KLOR-CON  M20) 20 MEQ ER tablet Take 1 tablet (20 mEq total) by mouth once daily 30 tablet 11  . amLODIPine  (NORVASC ) 10 MG tablet Take 1 tablet (10 mg total) by mouth once daily 90 tablet 1  . atorvastatin  (LIPITOR) 80 MG tablet Take 80 mg by mouth once daily    . omeprazole (PRILOSEC) 20 MG DR capsule Take 20 mg by mouth once daily as needed    . ibuprofen (MOTRIN) 600 MG tablet Take 600 mg by mouth 3 (three) times daily    .  tamsulosin  (FLOMAX ) 0.4 mg capsule TAKE 1 CAPSULE BY MOUTH ONCE DAILY AFTER SUPPER    . cyanocobalamin  (VITAMIN B12) 1000 MCG tablet Take 1,000 mcg by mouth once daily     No facility-administered medications prior to visit.      Objective  Vitals:   05/30/24 1355  BP: 135/86  Pulse: 74  SpO2: 99%  Weight: (!) 103.4 kg (228 lb)  PainSc: 0-No pain   Body mass index is 32.71 kg/m.  Home Vitals:     Physical Exam Physical Exam VITALS: BP- 135/86 CHEST: Decreased air entry in the lungs.  Constitutional: alert, obese, in NAD, sitting comfortably on a wheelchair, and communicates well Eye exam: sclera anicteric. Neck: supple, no thyroid  enlargement or cervical adenopathy, and no  bruits heard Respiratory: clear to auscultation, without rales or wheezes  Cardiovascular: regular rate and rhythm and without murmurs, rubs or gallops Lower extremities: no lower extremity edema Skin ankles/feet: warm, good capillary refill Neurological: sensorimotor grossly intact  Results       Assessment/Plan:   Assessment & Plan Essential hypertension Blood pressure is well-controlled at 135/86 mmHg. - Renewed amlodipine  prescription.  Mixed hyperlipidemia Cholesterol levels are well-controlled. - Renewed atorvastatin  prescription.  Hypomagnesemia Currently supplementing with magnesium . - Ordered magnesium  level.  Hypokalemia Currently supplementing with potassium chloride . - Ordered potassium level.  Deficiency of other specified B group vitamins Currently taking B12 supplements. - Renewed B12 prescription.  We will check folic acid .  Iron  deficiency anemia secondary to chronic blood loss Difficulty obtaining ferrous sulfate due to pharmacy stock issues. - Advised to contact pharmacy to resolve ferrous sulfate availability.  Gastroesophageal reflux disease Continues to use Prilosec for management. - Renewed Prilosec prescription.  General Health Maintenance Discussed the importance of lung exercises to prevent pneumonia, especially during flu season. - Encouraged use of incentive spirometer to improve lung function. Diagnoses and all orders for this visit:  Primary hypertension -     Basic Metabolic Panel (BMP); Future  Mixed hyperlipidemia -     atorvastatin  (LIPITOR) 80 MG tablet; Take 1 tablet (80 mg total) by mouth once daily  Hypomagnesemia -     Magnesium ; Future  Hypokalemia -     Basic Metabolic Panel (BMP); Future  Vitamin B12 deficiency -     cyanocobalamin  (VITAMIN B12) 1000 MCG tablet; Take 1 tablet (1,000 mcg total) by mouth once daily  Iron  deficiency anemia due to chronic blood loss -     Complete Blood Count (CBC) with  Differential; Future  Essential hypertension -     amLODIPine  (NORVASC ) 10 MG tablet; Take 1 tablet (10 mg total) by mouth once daily  GERD without esophagitis -     omeprazole (PRILOSEC) 20 MG DR capsule; Take 1 capsule (20 mg total) by mouth once daily as needed    This visit was coded based on medical decision making (MDM).           Future Appointments     Date/Time Provider Department Center Visit Type   09/27/2024 1:45 PM (Arrive by 1:30 PM) Eliverto Bette Hover, MD [facility] OFFICE VISIT   11/19/2024 1:00 PM (Arrive by 12:45 PM) [facility] NURSE - [facility] WELLNESS   11/19/2024 2:00 PM (Arrive by 1:45 PM) Eliverto Bette Hover, MD [facility] OFFICE VISIT       There are no Patient Instructions on file for this visit.  An after visit  summary was provided for the patient either in written format (printed) or through [facility].  This note has been created using automated tools and reviewed for accuracy by MARIO E OLMEDO.

## 2024-05-31 NOTE — Progress Notes (Signed)
 [facility]  [address], [address] [place], [place] [address]  Phone: [phone] Referral Fax: [phone]   Date May 31, 2024 Name Mark Durham MRN [id number] Primary care provider [facility] Referring Provider Mark Durham   Assessment:   Mark Durham is a 73 y.o. old male who comes for a follow up visit regarding the management of his post stroke epilepsy. Patient has no seizure over the last more than 8 months.  He continues to take lacosamide  200 mg twice a day and has not missed any dose.   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   Seizure Type:   Focal to tonic clonic  Seizure Etiology:  Post stroke epilepsy   Previous Seizure Medications: Mark Durham  Current medications include: Mark Durham   Plan:  - I would like to continue current medications without changes .   - Follow up: Return in about 1 year (around 05/31/2025).  I personally spent 12 minutes face-to-face and non-face-to-face in the care of this patient, which includes all pre, intra, and post visit time on the date of service.   We discussed my impressions regarding the patient's findings, including diagnosis, test results, and implications on future health, effects and side effects of present and future potential medications, as well as further testing and medications required. Issues discussed also included safety factors related to [place] DMV laws pertaining to seizure, swimming and bathing, heights, machinery, burns. We discussed about seizures first aid and taught the patient and family the phrase STAY, SAFE, SIDE which is an easy-to-remember phrse to help others' who are having a seizure. These 3 steps apply to people having seizures anywhere.     Mark Minder  MD  Neurocritical Care Attending Physician   Chief Complaint:  History of Present Illness Mark Durham is a 73 y.o. ight handed  man with post stroke epilepsy, in brief Patient had an ischemic stroke in Jan 2024, after having a small left MCA / PCA [place] stroke, no TPA was given due to outside the window.  At presentation in the ER patient had minimal speech, right sided gaze deviation and rhythmic movements for what he was given a total of 60 mg/kg LEV (max 4.5 gr load) and was started o LEV 1.5 gr BID. Subsequently EEG on 1/15 showed continuous 1 - 1.5 HZ LPDS+R over the left posterior quadrant   He also had an electrographic siezure on 07/27/2022    Following on clinic  SELECT score: 6  (corresponds to a late seizure risk of 18% within 1 year and of 29% within 5 years after stroke)  Cortical 1  NIHSS 11   2  Early seizures  3     At follow up patient was seen in resident clinic and he was on LEV 1.5 gr but at the time he was complaining of somnolence and irritability, for what Dr Mark Durham started to reduce his LEV to 1 gr BID    Following I saw the patient in clinic and planned to wean Mark Durham while starting and increasing Lacosamide  (cross taper).  Subsequently the patient start doing that but stopped taking all anti seizure medication and he had a focal to tonic seizures in Sept 9, 2024  (the wife found him on the floor. he was on his knees and shaking and in between seizures he would  come back and then started to  have a seizure again.   Loss of consiousness for 1 min)      Seizure onset age / date: 66 / 07/25/22   Seizure Type 1:  (07/25/22) Aura / prodrome: None Seizure Description: diffuse and focal left slowing, frequent LPD's in the left occipital region 1-1.5 Hz frequency Seizure Frequency: 1-1.5 Hz  Last seizure: No history of prior seizure   Seizure Type 2:  (07/27/22) Aura / prodrome: None Seizure Description: electrographic seizure with rhythmical 4 Hz emerging from left posterior region with behavioral arrest and head turn to right Seizure Frequency:4 Hz Last seizure: 1/14   Studies since the  last clinic visit: None   Past Medical History: He  has a past medical history of Stroke    (CMS-HCC).  Family History: His family history is not on file. Negative for epilepsy or significant neurological problems  Social history: He  reports that he quit smoking about 22 months ago. His smoking use included cigars. He does not have any smokeless tobacco history on file. He reports that he does not currently use alcohol after a past usage of about 2.0 standard drinks of alcohol per week. He reports that he does not use drugs.  The patient does not smoke or drink alcohol.  There is no history of illicit drug use.  Medications: He has a current medication list which includes the following prescription(s): acetaminophen , amlodipine , aspirin , atorvastatin , ibuprofen, omeprazole, polysaccharide iron  complex, senna, tamsulosin , lacosamide , melatonin, and polyethylene glycol. Current Medications[1]  Allergies: He has no known allergies. Patient has no known allergies.  ROS: Full 10 Points of Review of System are reviewed and negative except stated above in HPI   PASS CLINIC patient structured interview:  What do you remember about the seizure/seizures at the time of recent hospital admission?  How has having a seizure affected your overall quality of life? Please explain in terms of following: social, work, day to day activities, activities of daily living.  What are your worries for your future given that you had a seizure recently?  What do you think is the purpose of the anti-seizure medication prescribed to you?   What are your concerns about taking anti-seizure medications?  How would you describe your mood since your recent hospitalization?  What do you hope to achieve from today's clinic visit?  What are your health related concerns since your recent hospitalization? Of the ones you mentioned, what are the top-3 for you? And, why are they important to you?     Physical  Examination: Vital Sign: Blood pressure 137/81, pulse 70, temperature 35.8 C (96.4 F), temperature source Temporal, height 177.8 cm (5' 10), weight (!) 103.4 kg (228 lb). General: No acute distress, Well developed/groomed/nourished  Eyes: Pupil equal round reacting to light and accomodation. Extra occular muscles intact, and sclera clear  ENT: Well hydrated moist mucous memberane of the oral cavity. oropharhynx without any erythema or exudate.  Neck: Supple without any enlargements, no thyromegaly  Cardiovascular : Pulse normal rate, regularity and rhythm. S1 and S2 normal.  Lungs: Clear to auscultation bilateraly, without wheezes/crackles/rhonchi. Good air movement.  Skin: No rash/lesions/breakdown  Psychiatry : appropriate  Abdomen: Normoactive bowel sounds, abdomen soft, non-tender and not distended.  Extremeties: No bilateral cyanosis, clubbing or edema.  Musculo Skeletal:No joint tenderness, deformity, effusions.   Neurological:  Mental Status:  Alert and Oriented to person,place or date and time. Clear speech, intact naming and repetition, no difficulty with counting by 3 or 7. Follows commands well, normal  fund of knowledge and attention span.  Mental Status: awake and alerttime, date, person normal speech .  Cranial Nerves: II-XII intact. No papilledema, fundi disc and vessels are sharp and clear. EOMI. no Nystagmus noted. Symmetrical facial sensation and movement, hearing, palatal rise, tongue protrusion and shoulder shrugs.   Motor: normal bulk and tone throughout.  Strength in extremities  RUE 5/5;  RLE5/5;LUE4/5;LLE4/5 Sensory: intact to light touch, vibration and temperature throughout.  Coordination: no tremor to outstretched arms.   no dysmetria noted.  Intact with finger-nose-finger.  Normal  dysdiadochokinesis to rapid alternating movement bilaterally.     Modified Rankin Scale: 1        [1] Current Outpatient Medications  Medication Sig Dispense Refill  .  acetaminophen  (TYLENOL ) 500 MG tablet Take 2 tablets (1,000 mg total) by mouth Three (3) times a day as needed for pain. Two tabs of 500 mg (1000 mg total) as needed for pain    . amlodipine  (NORVASC ) 10 MG tablet Take 1 tablet (10 mg total) by mouth daily. 30 tablet 9  . aspirin  81 MG chewable tablet Chew 1 tablet (81 mg total) by mouth daily. 30 tablet 1  . atorvastatin  (LIPITOR) 80 MG tablet Take 1 tablet (80 mg total) by mouth daily. 30 tablet 10  . ibuprofen 200 mg cap Take 1 tablet by mouth every hour as needed.    SABRA omeprazole (PRILOSEC OTC) 20 MG tablet Take 1 tablet (20 mg total) by mouth daily as needed (as needed for reflux). As needed for reflux    . polysaccharide iron  complex (NIFEREX) 150 mg iron  capsule Take 1 capsule (150 mg total) by mouth.    . SENNA 8.6 mg tablet Take 2 tablets by mouth nightly.    . tamsulosin  (FLOMAX ) 0.4 mg capsule Take 1 capsule (0.4 mg total) by mouth daily.    . lacosamide  (VIMPAT ) 200 mg tablet Take 1 tablet (200 mg total) by mouth two (2) times a day. 180 tablet 6  . melatonin 3 mg cap Take by mouth. (Patient not taking: Reported on 05/31/2024)    . polyethylene glycol (GLYCOLAX ) 17 gram/dose powder Take 17 g by mouth as needed. As needed for constipation (Patient not taking: Reported on 05/31/2024)     No current facility-administered medications for this visit.

## 2024-06-14 ENCOUNTER — Emergency Department

## 2024-06-14 ENCOUNTER — Inpatient Hospital Stay
Admission: EM | Admit: 2024-06-14 | Discharge: 2024-06-18 | DRG: 872 | Disposition: A | Attending: Internal Medicine | Admitting: Internal Medicine

## 2024-06-14 ENCOUNTER — Other Ambulatory Visit: Payer: Self-pay

## 2024-06-14 DIAGNOSIS — N390 Urinary tract infection, site not specified: Principal | ICD-10-CM | POA: Diagnosis present

## 2024-06-14 DIAGNOSIS — R112 Nausea with vomiting, unspecified: Secondary | ICD-10-CM

## 2024-06-14 DIAGNOSIS — R531 Weakness: Secondary | ICD-10-CM

## 2024-06-14 DIAGNOSIS — K2100 Gastro-esophageal reflux disease with esophagitis, without bleeding: Secondary | ICD-10-CM

## 2024-06-14 LAB — URINALYSIS, ROUTINE W REFLEX MICROSCOPIC
Bilirubin Urine: NEGATIVE
Glucose, UA: NEGATIVE mg/dL
Ketones, ur: NEGATIVE mg/dL
Leukocytes,Ua: NEGATIVE
Nitrite: POSITIVE — AB
Protein, ur: 30 mg/dL — AB
RBC / HPF: >50 RBC/hpf (ref 0–5)
Specific Gravity, Urine: >1.046 — ABNORMAL HIGH (ref 1.005–1.030)
pH: 6 (ref 5.0–8.0)

## 2024-06-14 LAB — COMPREHENSIVE METABOLIC PANEL WITH GFR
ALT: 16 U/L (ref 0–44)
AST: 16 U/L (ref 15–41)
Albumin: 4.5 g/dL (ref 3.5–5.0)
Alkaline Phosphatase: 167 U/L — ABNORMAL HIGH (ref 38–126)
Anion gap: 15 (ref 5–15)
BUN: 18 mg/dL (ref 8–23)
CO2: 24 mmol/L (ref 22–32)
Calcium: 9.6 mg/dL (ref 8.9–10.3)
Chloride: 101 mmol/L (ref 98–111)
Creatinine, Ser: 1.19 mg/dL (ref 0.61–1.24)
GFR, Estimated: >60 mL/min (ref >60)
Glucose, Bld: 155 mg/dL — ABNORMAL HIGH (ref 70–99)
Potassium: 3.8 mmol/L (ref 3.5–5.1)
Sodium: 140 mmol/L (ref 135–145)
Total Bilirubin: 0.9 mg/dL (ref 0.0–1.2)
Total Protein: 8.6 g/dL — ABNORMAL HIGH (ref 6.5–8.1)

## 2024-06-14 LAB — CBC
HCT: 50.3 % (ref 39.0–52.0)
Hemoglobin: 16.3 g/dL (ref 13.0–17.0)
MCH: 26.8 pg (ref 26.0–34.0)
MCHC: 32.4 g/dL (ref 30.0–36.0)
MCV: 82.7 fL (ref 80.0–100.0)
Platelets: 315 K/uL (ref 150–400)
RBC: 6.08 MIL/uL — ABNORMAL HIGH (ref 4.22–5.81)
RDW: 13.4 % (ref 11.5–15.5)
WBC: 13.6 K/uL — ABNORMAL HIGH (ref 4.0–10.5)
nRBC: 0 % (ref 0.0–0.2)

## 2024-06-14 LAB — LIPASE, BLOOD: Lipase: 14 U/L (ref 11–51)

## 2024-06-14 LAB — RESP PANEL BY RT-PCR (RSV, FLU A&B, COVID)  RVPGX2
Influenza A by PCR: NEGATIVE
Influenza B by PCR: NEGATIVE
Resp Syncytial Virus by PCR: NEGATIVE
SARS Coronavirus 2 by RT PCR: NEGATIVE

## 2024-06-14 MED ORDER — SODIUM CHLORIDE 0.9 % IV SOLN
1.0000 g | Freq: Once | INTRAVENOUS | Status: AC
  Administered 2024-06-15: 1 g via INTRAVENOUS
  Filled 2024-06-14: qty 10

## 2024-06-14 MED ORDER — IOHEXOL 300 MG/ML  SOLN
100.0000 mL | Freq: Once | INTRAMUSCULAR | Status: AC | PRN
  Administered 2024-06-14: 100 mL via INTRAVENOUS

## 2024-06-14 MED ORDER — MORPHINE SULFATE (PF) 4 MG/ML IV SOLN
4.0000 mg | Freq: Once | INTRAVENOUS | Status: AC
  Administered 2024-06-14: 4 mg via INTRAVENOUS
  Filled 2024-06-14: qty 1

## 2024-06-14 MED ORDER — SODIUM CHLORIDE 0.9 % IV BOLUS
1000.0000 mL | Freq: Once | INTRAVENOUS | Status: AC
  Administered 2024-06-14: 1000 mL via INTRAVENOUS

## 2024-06-14 MED ORDER — ONDANSETRON HCL 4 MG/2ML IJ SOLN
4.0000 mg | Freq: Once | INTRAMUSCULAR | Status: AC
  Administered 2024-06-14: 4 mg via INTRAVENOUS
  Filled 2024-06-14: qty 2

## 2024-06-14 MED ORDER — ALUM & MAG HYDROXIDE-SIMETH 200-200-20 MG/5ML PO SUSP
30.0000 mL | Freq: Once | ORAL | Status: AC
  Administered 2024-06-14: 30 mL via ORAL
  Filled 2024-06-14: qty 30

## 2024-06-14 MED ORDER — FAMOTIDINE IN NACL 20-0.9 MG/50ML-% IV SOLN
20.0000 mg | Freq: Once | INTRAVENOUS | Status: AC
  Administered 2024-06-14: 20 mg via INTRAVENOUS
  Filled 2024-06-14: qty 50

## 2024-06-14 NOTE — ED Provider Notes (Signed)
 [facility] Provider Note    Event Date/Time   First MD Initiated Contact with Patient 06/14/24 1946     (approximate)   History   Emesis   HPI  Mark Durham is a 73 y.o. male past medical history of GERD, arthritis, hyperlipidemia hypertension, CVA with right-sided deficits, seizure disorder who presents with 24 hours of coughing, vomiting and diarrhea x 1 day.  Patient reports that he does have some abdominal pain but denies any bright red blood per rectum or dark stools or blood in his emesis.  He denies any pain with urination.  Pain is mostly located in his right upper quadrant and epigastrium.  He presents with his significant other who helps contribute to the history      Physical Exam   Triage Vital Signs: ED Triage Vitals  Encounter Vitals Group     BP 06/14/24 1804 (!) 144/94     Girls Systolic BP Percentile --      Girls Diastolic BP Percentile --      Boys Systolic BP Percentile --      Boys Diastolic BP Percentile --      Pulse Rate 06/14/24 1804 (!) 107     Resp 06/14/24 1804 20     Temp 06/14/24 1804 98.5 F (36.9 C)     Temp Source 06/14/24 1804 Oral     SpO2 06/14/24 1804 98 %     Weight 06/14/24 1803 225 lb (102.1 kg)     Height 06/14/24 1803 5' 7 (1.702 m)     Head Circumference --      Peak Flow --      Pain Score 06/14/24 1803 0     Pain Loc --      Pain Education --      Exclude from Growth Chart --     Most recent vital signs: Vitals:   06/14/24 2030 06/14/24 2145  BP: 134/89   Pulse: 95 95  Resp:    Temp:    SpO2: 90% 95%    Nursing Triage Note reviewed. Vital signs reviewed and patients oxygen saturation is normoxic  General: Patient is well nourished, well developed, awake and alert, appears unwell  head: Normocephalic and atraumatic Eyes: Normal inspection, extraocular muscles intact, no conjunctival pallor Ear, nose, throat: Normal external exam Neck: Normal range of motion Respiratory: Patient is  in no respiratory distress, lungs CTAB Cardiovascular: Patient is tachycardic, RR without murmur appreciated GI: Abd soft, tender to palpation in the epigastrium and right upper quadrant Back: Normal inspection of the back with good strength and range of motion throughout all ext Extremities: pulses intact with good cap refills, no LE pitting edema or calf tenderness Neuro: The patient is alert and oriented to person, place, and time, appropriately conversive, with 5/5 bilat UE/LE strength, no gross motor or sensory defects noted. Coordination appears to be adequate. Skin: Warm, dry, and intact Psych: normal mood and affect, no SI or HI  ED Results / Procedures / Treatments   Labs (all labs ordered are listed, but only abnormal results are displayed) Labs Reviewed  COMPREHENSIVE METABOLIC PANEL WITH GFR - Abnormal; Notable for the following components:      Result Value   Glucose, Bld 155 (*)    Total Protein 8.6 (*)    Alkaline Phosphatase 167 (*)    All other components within normal limits  CBC - Abnormal; Notable for the following components:   WBC 13.6 (*)  RBC 6.08 (*)    All other components within normal limits  URINALYSIS, ROUTINE W REFLEX MICROSCOPIC - Abnormal; Notable for the following components:   Color, Urine YELLOW (*)    APPearance HAZY (*)    Specific Gravity, Urine >1.046 (*)    Hgb urine dipstick MODERATE (*)    Protein, ur 30 (*)    Nitrite POSITIVE (*)    Bacteria, UA RARE (*)    All other components within normal limits  RESP PANEL BY RT-PCR (RSV, FLU A&B, COVID)  RVPGX2  URINE CULTURE  CULTURE, BLOOD (ROUTINE X 2)  CULTURE, BLOOD (ROUTINE X 2)  LIPASE, BLOOD  LACTIC ACID, PLASMA     EKG None  RADIOLOGY CT abd and pelvis with iv contrast: No acute abnormality on my independent review interpretation radiologist states no acute abnormality but possible reflux versus early malignancy; copy of report given to patient a wife nd Right upper quadrant  ultrasound: No acute abnormality    PROCEDURES:  Critical Care performed: No  Procedures   MEDICATIONS ORDERED IN ED: Medications  cefTRIAXone  (ROCEPHIN ) 1 g in sodium chloride  0.9 % 100 mL IVPB (1 g Intravenous New Bag/Given 06/15/24 0050)  morphine  (PF) 4 MG/ML injection 4 mg (4 mg Intravenous Given 06/14/24 2028)  ondansetron  (ZOFRAN ) injection 4 mg (4 mg Intravenous Given 06/14/24 2028)  sodium chloride  0.9 % bolus 1,000 mL (0 mLs Intravenous Stopped 06/14/24 2200)  iohexol  (OMNIPAQUE ) 300 MG/ML solution 100 mL (100 mLs Intravenous Contrast Given 06/14/24 2052)  famotidine  (PEPCID ) IVPB 20 mg premix (0 mg Intravenous Stopped 06/14/24 2236)  alum & mag hydroxide-simeth (MAALOX/MYLANTA) 200-200-20 MG/5ML suspension 30 mL (30 mLs Oral Given 06/14/24 2205)     IMPRESSION / MDM / ASSESSMENT AND PLAN / ED COURSE                                Differential diagnosis includes, but is not limited to: Upper respiratory infection, cholecystitis, colitis, small bowel obstruction, UTI, electrolyte derangement   ED course: Patient presents and he appears unwell.  He did have a mild leukocytosis but no anemia and no profound electrolyte derangements.  Urinalysis was consistent with UTI and he was given a dose of ceftriaxone .  Right upper quadrant ultrasound demonstrated no evidence of cholecystitis and CT abdomen pelvis demonstrated no acute abnormalities (wife and patient counseled on incidental finding and need for follow-up with GI).  Patient reassessed and he feels too weak to return home and has not taken any p.o.  Case discussed with hospitalist for admission   Clinical Course as of 06/15/24 0054  Thu Jun 14, 2024  2306 Nitrite(!): POSITIVE Possibly consistent with UTI [HD]  2310 Patient states that he feels too weak to return home and his urinalysis is consistent with UTI.  We discussed admission with the hospitalist [HD]  2327 Case discussed with hospitalist for admission [HD]     Clinical Course User Index [HD] Nicholaus Rolland BRAVO, MD   -- Risk: 5 This patient has a high risk of morbidity due to further diagnostic testing or treatment. Rationale: This patient's evaluation and management involve a high risk of morbidity due to the potential severity of presenting symptoms, need for diagnostic testing, and/or initiation of treatment that may require close monitoring. The differential includes conditions with potential for significant deterioration or requiring escalation of care. Treatment decisions in the ED, including medication administration, procedural interventions, or disposition planning, reflect this  level of risk. COPA: 5 The patient has the following acute or chronic illness/injury that poses a possible threat to life or bodily function: [X] : The patient has a potentially serious acute condition or an acute exacerbation of a chronic illness requiring urgent evaluation and management in the Emergency Department. The clinical presentation necessitates immediate consideration of life-threatening or function-threatening diagnoses, even if they are ultimately ruled out.   FINAL CLINICAL IMPRESSION(S) / ED DIAGNOSES   Final diagnoses:  Urinary tract infection with hematuria, site unspecified  Weakness  Nausea vomiting and diarrhea     Rx / DC Orders   ED Discharge Orders     None        Note:  This document was prepared using Dragon voice recognition software and may include unintentional dictation errors.   Nicholaus Rolland BRAVO, MD 06/15/24 [phone]

## 2024-06-14 NOTE — ED Notes (Signed)
 Pt unable to urinate on his own, RN performed straight cath per verbal order form MD Nicholaus. Pt tolerated cath well with very small amount of blood upon removal of catheter. Pt denied pain or discomfort. Pt ABCs intact. RR even and unlabored. Pt in NAD. Bed in lowest locked position. Call bell in reach. Denies needs at this time.

## 2024-06-14 NOTE — ED Notes (Signed)
 Pt reporting to ED d/t R sided abdominal pain, N/V. Pt with hx of stroke with R sided deficits. Pt ABCs intact. RR even and unlabored. Pt in NAD. Bed in lowest locked position. Call bell in reach. Denies needs at this time.   Past Medical History:  Diagnosis Date   CVA (cerebral vascular accident) (HCC)    Hypertension

## 2024-06-14 NOTE — ED Triage Notes (Signed)
 Patient states coughing, vomiting and diarrhea x 1 day

## 2024-06-15 DIAGNOSIS — R1010 Upper abdominal pain, unspecified: Secondary | ICD-10-CM

## 2024-06-15 DIAGNOSIS — R531 Weakness: Secondary | ICD-10-CM

## 2024-06-15 DIAGNOSIS — R197 Diarrhea, unspecified: Secondary | ICD-10-CM

## 2024-06-15 DIAGNOSIS — Z8673 Personal history of transient ischemic attack (TIA), and cerebral infarction without residual deficits: Secondary | ICD-10-CM

## 2024-06-15 DIAGNOSIS — N3001 Acute cystitis with hematuria: Secondary | ICD-10-CM

## 2024-06-15 DIAGNOSIS — N390 Urinary tract infection, site not specified: Secondary | ICD-10-CM | POA: Diagnosis not present

## 2024-06-15 DIAGNOSIS — R569 Unspecified convulsions: Secondary | ICD-10-CM

## 2024-06-15 DIAGNOSIS — R319 Hematuria, unspecified: Secondary | ICD-10-CM

## 2024-06-15 DIAGNOSIS — R112 Nausea with vomiting, unspecified: Secondary | ICD-10-CM | POA: Diagnosis not present

## 2024-06-15 DIAGNOSIS — K219 Gastro-esophageal reflux disease without esophagitis: Secondary | ICD-10-CM

## 2024-06-15 DIAGNOSIS — I10 Essential (primary) hypertension: Secondary | ICD-10-CM

## 2024-06-15 LAB — COMPREHENSIVE METABOLIC PANEL WITH GFR
ALT: 12 U/L (ref 0–44)
AST: 14 U/L — ABNORMAL LOW (ref 15–41)
Albumin: 3.9 g/dL (ref 3.5–5.0)
Alkaline Phosphatase: 132 U/L — ABNORMAL HIGH (ref 38–126)
Anion gap: 12 (ref 5–15)
BUN: 14 mg/dL (ref 8–23)
CO2: 26 mmol/L (ref 22–32)
Calcium: 8.9 mg/dL (ref 8.9–10.3)
Chloride: 105 mmol/L (ref 98–111)
Creatinine, Ser: 1.02 mg/dL (ref 0.61–1.24)
GFR, Estimated: >60 mL/min (ref >60)
Glucose, Bld: 113 mg/dL — ABNORMAL HIGH (ref 70–99)
Potassium: 3.9 mmol/L (ref 3.5–5.1)
Sodium: 143 mmol/L (ref 135–145)
Total Bilirubin: 0.9 mg/dL (ref 0.0–1.2)
Total Protein: 7.4 g/dL (ref 6.5–8.1)

## 2024-06-15 LAB — LACTIC ACID, PLASMA: Lactic Acid, Venous: 1.2 mmol/L (ref 0.5–1.9)

## 2024-06-15 LAB — CBC
HCT: 45.4 % (ref 39.0–52.0)
Hemoglobin: 14.5 g/dL (ref 13.0–17.0)
MCH: 26.9 pg (ref 26.0–34.0)
MCHC: 31.9 g/dL (ref 30.0–36.0)
MCV: 84.1 fL (ref 80.0–100.0)
Platelets: 263 K/uL (ref 150–400)
RBC: 5.4 MIL/uL (ref 4.22–5.81)
RDW: 13.6 % (ref 11.5–15.5)
WBC: 14 K/uL — ABNORMAL HIGH (ref 4.0–10.5)
nRBC: 0 % (ref 0.0–0.2)

## 2024-06-15 LAB — PHOSPHORUS: Phosphorus: 3.3 mg/dL (ref 2.5–4.6)

## 2024-06-15 LAB — MAGNESIUM: Magnesium: 2 mg/dL (ref 1.7–2.4)

## 2024-06-15 MED ORDER — SODIUM CHLORIDE 0.9 % IV SOLN
1.0000 g | INTRAVENOUS | Status: DC
  Administered 2024-06-16 – 2024-06-17 (×2): 1 g via INTRAVENOUS
  Filled 2024-06-15 (×2): qty 10

## 2024-06-15 MED ORDER — ENOXAPARIN SODIUM 60 MG/0.6ML IJ SOSY
0.5000 mg/kg | PREFILLED_SYRINGE | INTRAMUSCULAR | Status: DC
  Filled 2024-06-15 (×3): qty 0.6

## 2024-06-15 MED ORDER — ASPIRIN 81 MG PO TBEC
81.0000 mg | DELAYED_RELEASE_TABLET | Freq: Every day | ORAL | Status: DC
  Administered 2024-06-15 – 2024-06-18 (×4): 81 mg via ORAL
  Filled 2024-06-15 (×4): qty 1

## 2024-06-15 MED ORDER — ONDANSETRON HCL 4 MG PO TABS: 4.0000 mg | ORAL_TABLET | Freq: Four times a day (QID) | ORAL | Status: DC | PRN

## 2024-06-15 MED ORDER — LACOSAMIDE 50 MG PO TABS
200.0000 mg | ORAL_TABLET | Freq: Two times a day (BID) | ORAL | Status: DC
  Administered 2024-06-15 – 2024-06-18 (×7): 200 mg via ORAL
  Filled 2024-06-15 (×7): qty 4

## 2024-06-15 MED ORDER — AMLODIPINE BESYLATE 10 MG PO TABS
10.0000 mg | ORAL_TABLET | Freq: Every day | ORAL | Status: DC
  Administered 2024-06-15 – 2024-06-18 (×4): 10 mg via ORAL
  Filled 2024-06-15 (×4): qty 1

## 2024-06-15 MED ORDER — ACETAMINOPHEN 325 MG PO TABS: 650.0000 mg | ORAL_TABLET | Freq: Four times a day (QID) | ORAL | Status: DC | PRN

## 2024-06-15 MED ORDER — ONDANSETRON HCL 4 MG/2ML IJ SOLN: 4.0000 mg | Freq: Four times a day (QID) | INTRAMUSCULAR | Status: DC | PRN

## 2024-06-15 MED ORDER — LACTATED RINGERS IV SOLN: INTRAVENOUS | Status: AC

## 2024-06-15 MED ORDER — ATORVASTATIN CALCIUM 20 MG PO TABS
80.0000 mg | ORAL_TABLET | Freq: Every day | ORAL | Status: DC
  Administered 2024-06-15 – 2024-06-18 (×4): 80 mg via ORAL
  Filled 2024-06-15 (×4): qty 4

## 2024-06-15 MED ORDER — ACETAMINOPHEN 650 MG RE SUPP: 650.0000 mg | Freq: Four times a day (QID) | RECTAL | Status: DC | PRN

## 2024-06-15 NOTE — Progress Notes (Signed)
 Anticoagulation monitoring(Lovenox ):  73 yo male ordered Lovenox  40 mg Q24h    Filed Weights   06/14/24 1803  Weight: 102.1 kg (225 lb)   BMI 35.2   Lab Results  Component Value Date   CREATININE 1.19 06/14/2024   CREATININE 0.76 11/06/2023   CREATININE 1.04 11/05/2023   Estimated Creatinine Clearance: 62 mL/min (by C-G formula based on SCr of 1.19 mg/dL). Hemoglobin & Hematocrit     Component Value Date/Time   HGB 16.3 06/14/2024 1829   HCT 50.3 06/14/2024 1829     Per Protocol for Patient with estCrcl > 30 ml/min and BMI > 30, will transition to Lovenox  50 mg Q24h.

## 2024-06-15 NOTE — Discharge Instructions (Signed)
 Do you feel isolated?  [facility] offers a [employer] that anyone can call toll free at [phone]. The friendship line is available 24 hours a day  [facility] is a Program of All-inclusive Care for the Elderly (PACE). Their mission is to promote and sustain the independence of seniors wishing to remain in the community. They provide seniors with comprehensive long-term health, social, medical and dietary care. Their program is a safe alternative to nursing home care. [phone]  [facility] Physical Address [facility] [address] [place], [place] [address] Phone: [phone]. . Online zoom yoga class, connect with others without leaving your home [facility] offers [place] dance cardio sessions for individuals via Zoom. This program provides: - Dance fitness activities Please contact program for more information. Servinganyone in need adults 18+ hiv/aids individuals families Call [phone]  Email [email or web address] [email or web address] to get more info  [employer] offers an online [employer] to individuals where they can receive help to focus on their best health. Whether you're a [employer] member or not, the neighborhood center offers a... Main Serviceshealth education  exercise & fitness  community support services  recreation  virtual support Other Servicessupport groups Servinganyone in need adults young adults teens seniors individuals families [email or web address] to get more info  Schedule on their website  [facility] offers an array of activities for adults age 37 and over. This program provides:- Fitness and health programs- Tech classes- Activity books Main Serviceshealth education  community support services  exercise & fitness  recreation  more education Servingseniors  Call [phone]    For more resources go online to [email or web address] and type in you zipcode   Home  Health services have been arranged and set up with [facility]. They will contact you within 48 hours after discharge. If you do not receive a call from them please follow-up at [phone].

## 2024-06-15 NOTE — Progress Notes (Signed)
 Interval events noted.  He feels a little better.  Mid abdominal area feels sore.  He has trouble passing urine.  No other urinary symptoms reported.  No dysphagia or odynophagia.  Vomiting and diarrhea have improved.  Vital signs are stable.   Mark Durham is a 73 year old man with medical history significant for hypertension, hyperlipidemia, history of stroke, poststroke seizures, right-sided hemiparesis, who presented to the emergency department because of vomiting, diarrhea, coughing, abdominal pain and general weakness.  He was admitted to the hospital for acute UTI.  Continue IV ceftriaxone  and follow-up urine and blood cultures.  Distal esophageal wall thickening noted on CT abdomen.  Outpatient follow-up with gastroenterologist recommended for further evaluation.  PT and OT have been consulted for general weakness.

## 2024-06-15 NOTE — TOC Initial Note (Addendum)
 Transition of Care [facility]) - Initial/Assessment Note    Patient Details  Name: Mark Durham MRN: [id number] Date of Birth: Dec 26, 1950  [facility]) CM/SW Contact:    Mark JONETTA Hamilton, RN Phone Number: 06/15/2024, 2:49 PM  Clinical Narrative:                  Met with patient, introduced self and explained role. Patient independent at home with ADL's, lives with spouse and child. When discussing discharge planning and therapy recommendations, patient request this CM contact his wife Mark Durham to discuss. Patient verbalized he is just trying to sleep.   Spoke with Mark Durham via telephone, introduced self and explained role. Discussed with Mark Durham therapy recommendations for patient to have [facility] PT/OT once medically ready for discharge. Mark Durham verbalized she isn't familiar with the local agencies here as when patient had Home Health in the past it was arranged by [facility]. Advised TOC will send referrals and follow up with her for choice, Mark Durham verbalized understanding and agreement to this plan.   Referrals sent in HUB, no DME recommended.   Expected Discharge Plan: Home w Home Health Services Barriers to Discharge: Continued Medical Work up   Patient Goals and CMS Choice     Choice offered to / list presented to : Spouse      Expected Discharge Plan and Services   Discharge Planning Services: CM Consult Post Acute Care Choice: Home Health Living arrangements for the past 2 months: Single Family Home                                      Prior Living Arrangements/Services Living arrangements for the past 2 months: Single Family Home Lives with:: Spouse Patient language and need for interpreter reviewed:: Yes Do you feel safe going back to the place where you live?: Yes      Need for Family Participation in Patient Care: No (Comment) Care giver support system in place?: Yes (comment)   Criminal Activity/Legal Involvement Pertinent to Current  Situation/Hospitalization: No - Comment as needed  Activities of Daily Living   ADL Screening (condition at time of admission) Independently performs ADLs?: Yes (appropriate for developmental age) Is the patient deaf or have difficulty hearing?: No Does the patient have difficulty seeing, even when wearing glasses/contacts?: No Does the patient have difficulty concentrating, remembering, or making decisions?: No  Permission Sought/Granted Permission sought to share information with : Case Manager, Magazine Features Editor, Family Supports Permission granted to share information with : Yes, Verbal Permission Granted  Share Information with NAME: Mark Durham  Permission granted to share info w AGENCY: Agencies  Permission granted to share info w Relationship: Spouse  Permission granted to share info w Contact Information: [phone]  Emotional Assessment Appearance:: Appears stated age, Well-Groomed Attitude/Demeanor/Rapport: Other (comment) (Patient appeared to doze off multiple times during our discussion)   Orientation: : Oriented to Self, Oriented to Place, Oriented to Situation Alcohol / Substance Use: Not Applicable Psych Involvement: No (comment)  Admission diagnosis:  UTI (urinary tract infection) [N39.0] Weakness [R53.1] Nausea vomiting and diarrhea [R11.2, R19.7] Urinary tract infection with hematuria, site unspecified [N39.0, R31.9] Patient Active Problem List   Diagnosis Date Noted   History of CVA (cerebrovascular accident) 11/05/2023   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) 05/05/2023   Sepsis due to pneumonia (HCC) 01/09/2023   Severe sepsis (HCC) 01/09/2023   UTI (urinary  tract infection) 01/09/2023   HTN (hypertension) 01/09/2023   HLD (hyperlipidemia) 01/09/2023   Hypokalemia 01/09/2023   Tobacco use 01/09/2023   Obesity (BMI 30-39.9) 01/09/2023   CAP (community acquired pneumonia) 01/09/2023   Hypomagnesemia 01/09/2023    Hypophosphatemia 01/09/2023   TIA (transient ischemic attack) 01/05/2023   GERD (gastroesophageal reflux disease) 02/26/2021   S/P hip replacement, right 03/04/2020   Primary osteoarthritis of right hip 01/24/2020   Chronic pain of right knee 11/23/2019   Chronic right hip pain 11/23/2019   Osteoarthritis of knee 11/13/2018   History of adenomatous polyp of colon 02/14/2018   History of Barrett's esophagus 02/14/2018   PCP:  [name]:   [facility] [address] ([address]),  - [address] [place] ([place]) [place] [address] Phone: [phone] Fax: [phone]     Social Drivers of Health (SDOH) Social History: SDOH Screenings   Food Insecurity: No Food Insecurity (06/15/2024)  Housing: Low Risk  (06/15/2024)  Transportation Needs: No Transportation Needs (06/15/2024)  Utilities: Not At Risk (06/15/2024)  Financial Resource Strain: Low Risk  (11/18/2023)   Received from [facility]  Social Connections: Socially Isolated (06/15/2024)  Tobacco Use: High Risk (06/14/2024)  Health Literacy: Medium Risk (08/31/2022)   Received from [facility]   SDOH Interventions: Social Connections Interventions: Inpatient TOC, Other (Comment) (Resources added to AVS)   Readmission Risk Interventions     No data to display

## 2024-06-15 NOTE — ED Notes (Signed)
 Pt resting in bed on 2L [place]. Pt ABCs intact. RR even and unlabored. Pt in NAD. Bed in lowest locked position. Call bell in reach. Denies needs at this time.

## 2024-06-15 NOTE — ED Notes (Signed)
 Pt placed on [place] @ 2L for drop in O2 sat to 88% following Morphine  administration.

## 2024-06-15 NOTE — Evaluation (Signed)
 Physical Therapy Evaluation Patient Details Name: Mark Durham MRN: [id number] DOB: 01/02/1951 Today's Date: 06/15/2024  History of Present Illness  Mark Durham is a 73 y.o. 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  Clinical Impression  Pt is a 73 y.o. male admitted for the above impairments. He was received in recliner with wife in room and was agreeable to therapy. Pt able to perform STS to RW with CGA for steadiness-- reported dizziness d/t reduced PO intake. Pt ambulated 60 ft total with RW and CGA-- wife reports this is similar to how he moves at home. Pt is a household ambulator. He does demonstrate significant R toe drag and requires vc to clear toes and keep R foot within RW BOS. Pt returned to recliner with all needs met. He will continue to benefit from skilled PT services to address his reduction is functional strength, balance, and functional mobility.        If plan is discharge home, recommend the following: A little help with walking and/or transfers;A little help with bathing/dressing/bathroom   Can travel by private vehicle        Equipment Recommendations None recommended by PT  Recommendations for Other Services       Functional Status Assessment Patient has had a recent decline in their functional status and demonstrates the ability to make significant improvements in function in a reasonable and predictable amount of time.     Precautions / Restrictions Precautions Precautions: Fall Recall of Precautions/Restrictions: Intact Restrictions Weight Bearing Restrictions Per  Provider Order: No      Mobility  Bed Mobility               General bed mobility comments: NT - received in recliner    Transfers Overall transfer level: Needs assistance Equipment used: Rolling walker (2 wheels) Transfers: Sit to/from Stand Sit to Stand: Contact guard assist                Ambulation/Gait Ambulation/Gait assistance: Contact guard assist Gait Distance (Feet): 60 Feet Assistive device: Rolling walker (2 wheels) Gait Pattern/deviations: Step-to pattern, Decreased stride length, Decreased weight shift to right, Decreased dorsiflexion - right, Decreased stance time - right, Decreased step length - left       General Gait Details: notable R toe drag; vc to keep R foot within RW BOS  Stairs            Wheelchair Mobility     Tilt Bed    Modified Rankin (Stroke Patients Only)       Balance Overall balance assessment: Needs assistance Sitting-balance support: No upper extremity supported, Feet supported Sitting balance-Leahy Scale: Good     Standing balance support: Bilateral upper extremity supported, Reliant on assistive device for balance, During functional activity Standing balance-Leahy Scale: Fair Standing balance comment: no LOB observed; reports of dizziness d/t reduce PO intake                             Pertinent Vitals/Pain Pain Assessment Pain Assessment: No/denies pain    Home Living Family/patient expects to be discharged to:: Private residence Living Arrangements:  Spouse/significant other;Children Available Help at Discharge: Family;Available PRN/intermittently Type of Home: House Home Access: Stairs to enter Entrance Stairs-Rails: Can reach both;Right;Left Entrance Stairs-Number of Steps: 5   Home Layout: One level Home Equipment: Agricultural Consultant (2 wheels);BSC/3in1;Wheelchair - manual;Shower seat;Cane - single point Additional Comments: Pt is limited historian. No family/caregiver available to  confirm baseline function.    Prior Function Prior Level of Function : Needs assist  Cognitive Assist :  (Cognitive deficits at baseline.)           Mobility Comments: Pt states he is amb with a RW at baseline. Household ambulator. Denies falls history. ADLs Comments: Per pt/chart wife and dtr assist with IADL management including driving, medication management, etc. Pt reports he is independent with ADLs including dressing, bathing, etc.     Extremity/Trunk Assessment   Upper Extremity Assessment Upper Extremity Assessment: Overall WFL for tasks assessed    Lower Extremity Assessment Lower Extremity Assessment: RLE deficits/detail RLE Deficits / Details: significant toe drag with ambulation    Cervical / Trunk Assessment Cervical / Trunk Assessment: Normal  Communication   Communication Communication: No apparent difficulties    Cognition Arousal: Alert Behavior During Therapy: WFL for tasks assessed/performed   PT - Cognitive impairments: Difficult to assess, Awareness                         Following commands: Impaired Following commands impaired: Follows one step commands with increased time     Cueing Cueing Techniques: Verbal cues, Gestural cues, Tactile cues     General Comments      Exercises Other Exercises Other Exercises: edu on aspects of gait listed above; edu on DME safety   Assessment/Plan    PT Assessment Patient needs continued PT services  PT Problem List Decreased strength;Decreased range of motion;Decreased activity tolerance;Decreased balance;Decreased mobility;Decreased coordination;Decreased knowledge of use of DME;Decreased safety awareness       PT Treatment Interventions Gait training;Stair training;DME instruction;Therapeutic activities;Functional mobility training;Therapeutic exercise;Balance training;Neuromuscular re-education    PT Goals (Current goals can be found in the Care Plan section)  Acute Rehab PT  Goals Patient Stated Goal: to return home PT Goal Formulation: With patient Time For Goal Achievement: 06/29/24 Potential to Achieve Goals: Good    Frequency Min 2X/week     Co-evaluation               AM-PAC PT 6 Clicks Mobility  Outcome Measure Help needed turning from your back to your side while in a flat bed without using bedrails?: None Help needed moving from lying on your back to sitting on the side of a flat bed without using bedrails?: A Little Help needed moving to and from a bed to a chair (including a wheelchair)?: A Little Help needed standing up from a chair using your arms (e.g., wheelchair or bedside chair)?: None Help needed to walk in hospital room?: None Help needed climbing 3-5 steps with a railing? : A Lot 6 Click Score: 20    End of Session Equipment Utilized During Treatment: Gait belt Activity Tolerance: Patient tolerated treatment well Patient left: in chair;with chair alarm set;with family/visitor present;with call bell/phone within reach Nurse Communication: Mobility status PT Visit Diagnosis: Unsteadiness on feet (R26.81);Other abnormalities of gait and mobility (R26.89);Difficulty in walking, not elsewhere classified (R26.2)    Time: 8659-8640 PT Time Calculation (min) (ACUTE ONLY): 19 min   Charges:  Allena Bulls, SPT   Allena Bulls 06/15/2024, 2:28 PM

## 2024-06-15 NOTE — Plan of Care (Signed)
  Problem: Education: Goal: Knowledge of General Education information will improve Description: Including pain rating scale, medication(s)/side effects and non-pharmacologic comfort measures Outcome: Progressing   Problem: Clinical Measurements: Goal: Will remain free from infection Outcome: Progressing Goal: Cardiovascular complication will be avoided Outcome: Progressing   Problem: Coping: Goal: Level of anxiety will decrease Outcome: Progressing   Problem: Elimination: Goal: Will not experience complications related to bowel motility Outcome: Progressing

## 2024-06-15 NOTE — Evaluation (Signed)
 Occupational Therapy Evaluation Patient Details Name: Mark Durham MRN: [id number] DOB: 18-Aug-1950 Today's Date: 06/15/2024   History of Present Illness   Mark Durham is a 73 y.o. 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     Clinical Impressions Mark Durham was seen for OT evaluation this date. Prior to hospital admission, pt was generally independent with ADL management, while family provide assistance for IADLs. Pt reports he lives with his spouse and daughter. No caregivers available to confirm PLOF/Home set up information. Pt presents with deficits in strength, activity tolerance, and cognigion affecting safe and optimal ADL completion. Pt currently requires MOD A for bed mobility with MAX A to laterally scoot toward HOB. Anticipate MOD-MAX A for LB ADL management from STS.  Pt would benefit from skilled OT services to address noted impairments and functional limitations (see below for any additional details) in order to maximize safety and independence while minimizing future risk of falls, injury, and readmission. Anticipate the need for follow up OT services upon acute hospital DC.      If plan is discharge home, recommend the following:   A lot of help with walking and/or transfers;A lot of help with bathing/dressing/bathroom;Assistance with cooking/housework;Assist for transportation;Help with stairs or ramp for entrance;Supervision due to cognitive status     Functional Status Assessment   Patient has had a recent decline in their functional status and demonstrates the ability to make  significant improvements in function in a reasonable and predictable amount of time.     Equipment Recommendations   BSC/3in1     Recommendations for Other Services         Precautions/Restrictions   Precautions Precautions: Fall Recall of Precautions/Restrictions: Intact Restrictions Weight Bearing Restrictions Per Provider Order: No     Mobility Bed Mobility Overal bed mobility: Needs Assistance Bed Mobility: Supine to Sit     Supine to sit: Mod assist, HOB elevated, Used rails          Transfers Overall transfer level: Needs assistance                 General transfer comment: Pt declines STS t/fs this date. Requires MOD/MAX A to laterally scoot toward HOB.      Balance Overall balance assessment: Needs assistance   Sitting balance-Leahy Scale: Good Sitting balance - Comments: steady static sitting, reaching inside BOS.       Standing balance comment: NT, pt declines.                           ADL either performed or assessed with clinical judgement   ADL Overall ADL's : Needs assistance/impaired                                       General ADL Comments: Requires MOD A to come to sit at EOB with consistent multimodal cueing for transfer technique. Pt adamantly declines OOB activity, but is eager to eat his breakfast while seated EOB. Requires SET UP assistance for meal tray items, is able to  feed himself with some increased effort. Pt does c/o of dizziness upon coming to sitting position. Reports this improves as session progresses. Anticipate MOD-MAX A for LB ADL management from STS.     Vision Baseline Vision/History: 1 Wears glasses Ability to See in Adequate Light: 1 Impaired Patient Visual Report: No change from baseline       Perception         Praxis         Pertinent Vitals/Pain Pain Assessment Pain Assessment: No/denies pain     Extremity/Trunk Assessment Upper Extremity Assessment Upper  Extremity Assessment: Generalized weakness   Lower Extremity Assessment Lower Extremity Assessment: Generalized weakness       Communication Communication Communication: No apparent difficulties   Cognition Arousal: Alert Behavior During Therapy: WFL for tasks assessed/performed Cognition: History of cognitive impairments, No family/caregiver present to determine baseline             OT - Cognition Comments: Easily distracted by his roommate and enviornmental stimuli. Able to follow 1 step VCs with increased time/cueing.                 Following commands: Impaired Following commands impaired: Follows one step commands with increased time     Cueing  General Comments   Cueing Techniques: Verbal cues;Gestural cues;Tactile cues      Exercises Other Exercises Other Exercises: Pt educated on importance of frequent activity during hospital stay, role of OT in acute setting, safe transfer techniqe and falls prevention strategies.   Shoulder Instructions      Home Living Family/patient expects to be discharged to:: Private residence Living Arrangements: Spouse/significant other;Children (Dtr) Available Help at Discharge: Family;Available PRN/intermittently Type of Home: House Home Access: Stairs to enter [employer] of Steps: 5 Entrance Stairs-Rails: Can reach both;Right;Left Home Layout: One level     Bathroom Shower/Tub: Producer, Television/film/video: Standard Bathroom Accessibility: Yes   Home Equipment: Agricultural Consultant (2 wheels);BSC/3in1;Wheelchair - manual;Shower seat;Cane - single point   Additional Comments: Pt is limited historian. No family/caregiver available to confirm baseline function.      Prior Functioning/Environment Prior Level of Function : Needs assist  Cognitive Assist :  (Cognitive deficits at baseline.)           Mobility Comments: Pt states he is amb with a RW at baseline. Denies falls history. ADLs Comments: Per  pt/chart wife and dtr assist with IADL management including driving, medication management, etc. Pt reports he is independent with ADLs including dressing, bathing, etc.    OT Problem List: Decreased strength;Decreased coordination;Decreased safety awareness;Decreased activity tolerance;Impaired balance (sitting and/or standing);Decreased knowledge of use of DME or AE   OT Treatment/Interventions: Self-care/ADL training;Therapeutic exercise;Therapeutic activities;DME and/or AE instruction;Patient/family education;Balance training;Energy conservation      OT Goals(Current goals can be found in the care plan section)   Acute Rehab OT Goals Patient Stated Goal: To go home OT Goal Formulation: With patient Time For Goal Achievement: 06/29/24 Potential to Achieve Goals: Good ADL Goals Pt Will Perform Grooming: sitting;with set-up;with supervision Pt Will Perform Lower Body Dressing: sit to/from stand;with supervision;with set-up;with adaptive equipment Pt Will Transfer to Toilet: bedside commode;ambulating;with supervision;with set-up Pt Will Perform Toileting - Clothing Manipulation and hygiene: sit to/from stand;with supervision;with set-up;with adaptive equipment   OT Frequency:  Min 1X/week    Co-evaluation              AM-PAC OT 6 Clicks Daily Activity     Outcome Measure Help from another  person eating meals?: A Little Help from another person taking care of personal grooming?: A Little Help from another person toileting, which includes using toliet, bedpan, or urinal?: A Lot Help from another person bathing (including washing, rinsing, drying)?: A Lot Help from another person to put on and taking off regular upper body clothing?: A Little Help from another person to put on and taking off regular lower body clothing?: A Lot 6 Click Score: 15   End of Session Equipment Utilized During Treatment: Gait belt;Rolling walker (2 wheels)  Activity Tolerance: Patient tolerated  treatment well Patient left: Other (comment) (seated EOB to finish breakfast, NT notified/aware.)  OT Visit Diagnosis: Other abnormalities of gait and mobility (R26.89);Muscle weakness (generalized) (M62.81)                Time: 9093-9071 OT Time Calculation (min): 22 min Charges:  OT General Charges $OT Visit: 1 Visit OT Evaluation $OT Eval Low Complexity: 1 Low OT Treatments $Self Care/Home Management : 8-22 mins  Jhonny Pelton, M.S., OTR/L 06/15/24, 12:15 PM

## 2024-06-15 NOTE — H&P (Addendum)
 History and Physical    Patient: Mark Durham DOB: [birth year] DOA: 06/14/2024 DOS: the patient was seen and examined on 06/15/2024 PCP: [name]  Patient coming from: Home  Chief Complaint:  Chief Complaint  Patient presents with   Emesis   HPI: Mark Durham is a 73 y.o. 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  Patient denies black stool or any blood in the stool.  He denies fever, chills, chest pain, shortness of breath  ED course In the emergency department, BP was 144/94, other vital signs were within normal range.  Workup in the ED showed normal CBC except for WBC of 13.6.  BMP was normal except for blood glucose of 155.  Urinalysis was suggestive of UTI, lactic acid 1.2.  Respiratory panel was negative. CT abdomen and pelvis with contrast was suggestive of esophageal reflux versus esophagitis. Fluid throughout the small bowel and ascending colon, can be seen with enteritis or diarrheal illness. No bowel obstruction or inflammation. RUQ ultrasound showed cholelithiasis without sonographic evidence of acute cholecystitis. He was treated with IV ceftriaxone  for UTI, GI cocktail, Pepcid , morphine  and Zofran  were given.  IV hydration was provided.   Review of Systems: As mentioned in the history of present illness. All other systems reviewed and are negative. Past Medical History:  Diagnosis Date   CVA (cerebral vascular accident) (HCC)    Hypertension    Past Surgical History:  Procedure Laterality Date   TOTAL HIP ARTHROPLASTY Right    Social History:  reports  that he has been smoking. He has been exposed to tobacco smoke. He has never used smokeless tobacco. He reports that he does not drink alcohol and does not use drugs.  No Known Allergies  Family History  Problem Relation Age of Onset   Hypertension Mother     Prior to Admission medications   Medication Sig Start Date End Date Taking? Authorizing Provider  acetaminophen  (TYLENOL ) 500 MG tablet Take 1,000 mg by mouth every 6 (six) hours as needed for moderate pain (as needed for pain). Take 2 tablets 3x per day as needed for pain   Yes [provider]  amLODipine  (NORVASC ) 10 MG tablet Take 10 mg by mouth daily.   Yes [provider]  aspirin  EC 81 MG tablet Take 81 mg by mouth daily. Swallow whole.   Yes [provider]  atorvastatin  (LIPITOR) 80 MG tablet Take 80 mg by mouth daily. Take 1 tablet by mouth daily   Yes [provider]  iron  polysaccharides (NIFEREX) 150 MG capsule Take 150 mg by mouth daily. 02/20/24 02/19/25 Yes [provider]  lacosamide  (VIMPAT ) 200 MG TABS tablet Take 200 mg by mouth 2 (two) times daily. 05/31/24  Yes [provider]  omeprazole (PRILOSEC OTC) 20 MG tablet Take 20 mg by mouth daily. Take 1 tablet by mouth daily as needed.   Yes [provider]  polyethylene glycol (MIRALAX  / GLYCOLAX ) 17 g packet Take 17 g by mouth daily. Drink by mouth daily as needed.   Yes [provider]  potassium chloride  SA (KLOR-CON  M) 20 MEQ tablet Take 20 mEq by mouth  daily. 02/21/24 02/20/25 Yes [provider]  senna (SENOKOT) 8.6 MG TABS tablet Take 2 tablets (17.2 mg total) by mouth at bedtime as needed for mild constipation. Take 2 tablets by mouth nightly. 01/12/23  Yes Von Bellis, MD  Lacosamide  100 MG TABS Take 200 mg by mouth 2 (two) times daily. Patient not taking: Reported on 06/15/2024    [provider]  tadalafil  (CIALIS ) 20 MG tablet Take 1 tablet (20 mg total) by mouth daily as  needed for erectile dysfunction (1 hour prior to sexual activity). Patient not taking: Reported on 11/15/2022 11/18/21   Francisca Redell BROCKS, MD    Physical Exam: Vitals:   06/14/24 2145 06/14/24 2215 06/14/24 2315 06/15/24 0030  BP:      Pulse: 95 99 94 93  Resp:      Temp:      TempSrc:      SpO2: 95% 91% 95% 96%  Weight:      Height:       General: Elderly male. Awake and alert and oriented x3. Not in any acute distress.  HEENT: NCAT.  PERRLA. EOMI. Sclerae anicteric.  Moist mucosal membranes. Neck: Neck supple without lymphadenopathy. No carotid bruits. No masses palpated.  Cardiovascular: Regular rate with normal S1-S2 sounds. No murmurs, rubs or gallops auscultated. No JVD.  Respiratory: Clear breath sounds.  No accessory muscle use. Abdomen: Soft, tender to palpation in the epigastrium and RUQ.  Active bowel sounds. No masses or hepatosplenomegaly  Skin: No rashes, lesions, or ulcerations.  Dry, warm to touch. Musculoskeletal:  2+ dorsalis pedis and radial pulses. Good ROM.  No contractures  Psychiatric: Intact judgment and insight.  Mood appropriate to current condition. Neurologic: No focal neurological deficits. Strength is 5/5 x 4.  CN II - XII grossly intact.   Assessment and Plan: UTI POA Urine culture on 01/09/2023 was positive for E. coli which was sensitive to ceftriaxone  Patient was started on IV ceftriaxone , we shall continue with same at this time Urine and blood cultures pending  Nausea, vomiting and diarrhea Continue Zofran  as needed Continue IV hydration Patient has not had any bowel movement since arrival to the ED Consider C. difficile and GI stool panel if patient continues to have diarrhea Patient will be started on clear liquid diet with plan to advance as tolerated  Generalized weakness possibly due to above factors Continue management as described above Continue fall precaution Continue PT/OT eval and treat  Abdominal pain Continue IV  hydration Continue IV morphine  2 mg q.4h p.r.n. for moderate to severe pain Continue IV Zofran  p.r.n. Continue clear liquid diet with plan to advance diet as tolerated Obtain blood culture x2  History of stroke Continue aspirin , Lipitor  Essential hypertension Continue Norvasc   GERD CT abdomen and pelvis with contrast was suggestive of esophageal reflux versus esophagitis. Continue Pepcid , Prilosec  Seizures Continue Vimpat     Advance Care Planning: Full code  Consults: None  Family Communication: Wife at bedside (all questions answered to satisfaction)  Severity of Illness: The appropriate patient status for this patient is INPATIENT. Inpatient status is judged to be reasonable and necessary in order to provide the required intensity of service to ensure the patient's safety. The patient's presenting symptoms, physical exam findings, and initial radiographic and laboratory data in the context of their chronic comorbidities is felt to place them at high risk for further clinical deterioration. Furthermore, it is not anticipated that the patient will be medically stable for discharge from the hospital within  2 midnights of admission.   * I certify that at the point of admission it is my clinical judgment that the patient will require inpatient hospital care spanning beyond 2 midnights from the point of admission due to high intensity of service, high risk for further deterioration and high frequency of surveillance required.*  Author: Tandra Rosado, DO 06/15/2024 2:58 AM  For on call review [email or web address].

## 2024-06-16 DIAGNOSIS — N3001 Acute cystitis with hematuria: Secondary | ICD-10-CM | POA: Diagnosis not present

## 2024-06-16 LAB — CBC
HCT: 49.4 % (ref 39.0–52.0)
Hemoglobin: 15.7 g/dL (ref 13.0–17.0)
MCH: 26.6 pg (ref 26.0–34.0)
MCHC: 31.8 g/dL (ref 30.0–36.0)
MCV: 83.7 fL (ref 80.0–100.0)
Platelets: 256 K/uL (ref 150–400)
RBC: 5.9 MIL/uL — ABNORMAL HIGH (ref 4.22–5.81)
RDW: 13.3 % (ref 11.5–15.5)
WBC: 11.4 K/uL — ABNORMAL HIGH (ref 4.0–10.5)
nRBC: 0 % (ref 0.0–0.2)

## 2024-06-16 MED ORDER — ALUM & MAG HYDROXIDE-SIMETH 200-200-20 MG/5ML PO SUSP
5.0000 mL | Freq: Four times a day (QID) | ORAL | Status: DC | PRN
  Administered 2024-06-16 – 2024-06-18 (×4): 5 mL via ORAL
  Filled 2024-06-16 (×4): qty 30

## 2024-06-16 MED ORDER — PANTOPRAZOLE SODIUM 40 MG PO TBEC
40.0000 mg | DELAYED_RELEASE_TABLET | Freq: Every day | ORAL | Status: DC
  Administered 2024-06-16 – 2024-06-18 (×3): 40 mg via ORAL
  Filled 2024-06-16 (×3): qty 1

## 2024-06-16 NOTE — Progress Notes (Addendum)
 Progress Note    NOEL RODIER  FMW:[id number] DOB: 07/12/1951  DOA: 06/14/2024 PCP: [name]      Brief Narrative:    Medical records reviewed and are as summarized below:  CLEVESTER HELZER is a 73 y.o. male  with medical history significant for hypertension, hyperlipidemia, history of stroke, poststroke seizures, right-sided hemiparesis, who presented to the emergency department because of vomiting, diarrhea, coughing, abdominal pain and general weakness.    Vital signs in the ED: Temperature 98.5 F, respiratory rate 20, pulse 127, BP 144/94, O2 saturation 98% on room air.   CT abdomen and pelvis with contrast IMPRESSION: 1. Prominent wall thickening of the distal esophagus with indistinctness of the paraesophageal fat, reflux versus esophagitis. Recommend correlation with endoscopy to exclude underlying neoplasm. 2. Fluid throughout the small bowel and ascending colon, can be seen with enteritis or diarrheal illness. No bowel obstruction or inflammation. 3. Cholelithiasis without CT findings of acute cholecystitis. 4. Colonic diverticulosis without diverticulitis. 5. Patchy ground-glass opacity in the dependent lower lobes, favor atelectasis, although infection could have a similar appearance in the setting of cough.   Aortic Atherosclerosis (ICD10-I70.0).    Abdominal ultrasound showed cholelithiasis without evidence of acute cholecystitis.     Assessment/Plan:   Principal Problem:   UTI (urinary tract infection)    Body mass index is 34.53 kg/m.  (Class II obesity)   Sepsis secondary to acute UTI: Urine culture growing Morganella Morgagni species.  Continue IV ceftriaxone .  Follow-up urine culture sensitivity report. No prostatomegaly on CT pelvis.   Distal esophageal wall thickening on CT abdomen, GERD: Differential diagnoses include reflux esophagitis.  Of note, patient said he has been dealing with heartburn for a long time.   Prilosec usually helps at home. Start Protonix .  Continue MiraLAX  as needed. Outpatient follow-up with gastroenterologist recommended for further evaluation.   Nausea, abdominal pain vomiting and diarrhea: Improved   General Weakness: PT and OT recommended home health therapy.   Comorbidities include hypertension, seizure disorder, history of stroke   Diet Order             Diet clear liquid Room service appropriate? Yes; Fluid consistency: Thin  Diet effective now                                  Consultants: None  Procedures: None    Medications:    amLODipine   10 mg Oral Daily   aspirin  EC  81 mg Oral Daily   atorvastatin   80 mg Oral Daily   enoxaparin  (LOVENOX ) injection  0.5 mg/kg Subcutaneous Q24H   lacosamide   200 mg Oral BID   pantoprazole   40 mg Oral Daily   Continuous Infusions:  cefTRIAXone  (ROCEPHIN )  IV Stopped (06/16/24 0232)     Anti-infectives (From admission, onward)    Start     Dose/Rate Route Frequency Ordered Stop   06/16/24 0100  cefTRIAXone  (ROCEPHIN ) 1 g in sodium chloride  0.9 % 100 mL IVPB        1 g 200 mL/hr over 30 Minutes Intravenous Every 24 hours 06/15/24 0320     06/14/24 2315  cefTRIAXone  (ROCEPHIN ) 1 g in sodium chloride  0.9 % 100 mL IVPB        1 g 200 mL/hr over 30 Minutes Intravenous  Once 06/14/24 2306 06/15/24 0209              Family  Communication/Anticipated D/C date and plan/Code Status   DVT prophylaxis: SCDs Start: 06/15/24 0326     Code Status: Full Code  Family Communication: None Disposition Plan: Plan to discharge home   Status is: Inpatient Remains inpatient appropriate because: Acute UTI       Subjective:   Interval events noted.  He complains of heartburn.  No other complaints.  He has been able to pass urine without difficulty unlike yesterday.  Objective:    Vitals:   06/15/24 1648 06/15/24 1956 06/16/24 0419 06/16/24 0847  BP: (!) 141/81 (!) 155/90 (!)  166/87 (!) 158/95  Pulse: 85 99 90 95  Resp: 17 18 18 16   Temp: 99.1 F (37.3 C) 98.5 F (36.9 C) 99.5 F (37.5 C) 99.1 F (37.3 C)  TempSrc: Oral Oral Oral Oral  SpO2: 96% 95% 90% 95%  Weight:      Height:       No data found.   Intake/Output Summary (Last 24 hours) at 06/16/2024 1310 Last data filed at 06/16/2024 1032 Gross per 24 hour  Intake 1055 ml  Output --  Net 1055 ml   Filed Weights   06/14/24 1803 06/15/24 0345  Weight: 102.1 kg 101.5 kg    Exam:  GEN: NAD SKIN: Warm and dry EYES: No pallor or icterus ENT: MMM CV: RRR PULM: CTA B ABD: soft, obese, NT, +BS CNS: AAO x 3, non focal EXT: No edema or tenderness        Data Reviewed:   I have personally reviewed following labs and imaging studies:  Labs: Labs show the following:   Basic Metabolic Panel: Recent Labs  Lab 06/14/24 1829 06/15/24 0641  NA 140 143  K 3.8 3.9  CL 101 105  CO2 24 26  GLUCOSE 155* 113*  BUN 18 14  CREATININE 1.19 1.02  CALCIUM  9.6 8.9  MG  --  2.0  PHOS  --  3.3   GFR Estimated Creatinine Clearance: 72.8 mL/min (by C-G formula based on SCr of 1.02 mg/dL). Liver Function Tests: Recent Labs  Lab 06/14/24 1829 06/15/24 0641  AST 16 14*  ALT 16 12  ALKPHOS 167* 132*  BILITOT 0.9 0.9  PROT 8.6* 7.4  ALBUMIN 4.5 3.9   Recent Labs  Lab 06/14/24 1829  LIPASE 14   No results for input(s): AMMONIA in the last 168 hours. Coagulation profile No results for input(s): INR, PROTIME in the last 168 hours.  CBC: Recent Labs  Lab 06/14/24 1829 06/15/24 0641 06/16/24 0933  WBC 13.6* 14.0* 11.4*  HGB 16.3 14.5 15.7  HCT 50.3 45.4 49.4  MCV 82.7 84.1 83.7  PLT 315 263 256   Cardiac Enzymes: No results for input(s): CKTOTAL, CKMB, CKMBINDEX, TROPONINI in the last 168 hours. BNP (last 3 results) No results for input(s): PROBNP in the last 8760 hours. CBG: No results for input(s): GLUCAP in the last 168 hours. D-Dimer: No results for  input(s): DDIMER in the last 72 hours. Hgb A1c: No results for input(s): HGBA1C in the last 72 hours. Lipid Profile: No results for input(s): CHOL, HDL, LDLCALC, TRIG, CHOLHDL, LDLDIRECT in the last 72 hours. Thyroid  function studies: No results for input(s): TSH, T4TOTAL, T3FREE, THYROIDAB in the last 72 hours.  Invalid input(s): FREET3 Anemia work up: No results for input(s): VITAMINB12, FOLATE, FERRITIN, TIBC, IRON , RETICCTPCT in the last 72 hours. Sepsis Labs: Recent Labs  Lab 06/14/24 1829 06/15/24 0036 06/15/24 0641 06/16/24 0933  WBC 13.6*  --  14.0* 11.4*  LATICACIDVEN  --  1.2  --   --     Microbiology Recent Results (from the past 240 hours)  Resp panel by RT-PCR (RSV, Flu A&B, Covid) Urine, In & Out Cath     Status: None   Collection Time: 06/14/24  9:50 PM   Specimen: Urine, In & Out Cath; Nasal Swab  Result Value Ref Range Status   SARS Coronavirus 2 by RT PCR NEGATIVE NEGATIVE Final    Comment: (NOTE) SARS-CoV-2 target nucleic acids are NOT DETECTED.  The SARS-CoV-2 RNA is generally detectable in upper respiratory specimens during the acute phase of infection. The lowest concentration of SARS-CoV-2 viral copies this assay can detect is 138 copies/mL. A negative result does not preclude SARS-Cov-2 infection and should not be used as the sole basis for treatment or other patient management decisions. A negative result may occur with  improper specimen collection/handling, submission of specimen other than nasopharyngeal swab, presence of viral mutation(s) within the areas targeted by this assay, and inadequate number of viral copies(<138 copies/mL). A negative result must be combined with clinical observations, patient history, and epidemiological information. The expected result is Negative.  Fact Sheet for Patients:  bloggercourse.com  Fact Sheet for Healthcare Providers:   seriousbroker.it  This test is no t yet approved or cleared by the [place]  FDA and  has been authorized for detection and/or diagnosis of SARS-CoV-2 by FDA under an Emergency Use Authorization (EUA). This EUA will remain  in effect (meaning this test can be used) for the duration of the COVID-19 declaration under Section 564(b)(1) of the Act, 21 U.S.C.section 360bbb-3(b)(1), unless the authorization is terminated  or revoked sooner.       Influenza A by PCR NEGATIVE NEGATIVE Final   Influenza B by PCR NEGATIVE NEGATIVE Final    Comment: (NOTE) The Xpert Xpress SARS-CoV-2/FLU/RSV plus assay is intended as an aid in the diagnosis of influenza from Nasopharyngeal swab specimens and should not be used as a sole basis for treatment. Nasal washings and aspirates are unacceptable for Xpert Xpress SARS-CoV-2/FLU/RSV testing.  Fact Sheet for Patients: bloggercourse.com  Fact Sheet for Healthcare Providers: seriousbroker.it  This test is not yet approved or cleared by the [place]  FDA and has been authorized for detection and/or diagnosis of SARS-CoV-2 by FDA under an Emergency Use Authorization (EUA). This EUA will remain in effect (meaning this test can be used) for the duration of the COVID-19 declaration under Section 564(b)(1) of the Act, 21 U.S.C. section 360bbb-3(b)(1), unless the authorization is terminated or revoked.     Resp Syncytial Virus by PCR NEGATIVE NEGATIVE Final    Comment: (NOTE) Fact Sheet for Patients: bloggercourse.com  Fact Sheet for Healthcare Providers: seriousbroker.it  This test is not yet approved or cleared by the [place]  FDA and has been authorized for detection and/or diagnosis of SARS-CoV-2 by FDA under an Emergency Use Authorization (EUA). This EUA will remain in effect (meaning this test can be used) for  the duration of the COVID-19 declaration under Section 564(b)(1) of the Act, 21 U.S.C. section 360bbb-3(b)(1), unless the authorization is terminated or revoked.  Performed at [facility], [address] [place], [place] [address]   Urine Culture     Status: Abnormal (Preliminary result)   Collection Time: 06/14/24  9:50 PM   Specimen: Urine, Catheterized  Result Value Ref Range Status   Specimen Description   Final    URINE, CATHETERIZED Performed at [facility], [address] [place],  [place] [address]    Special Requests   Final    NONE Performed at [facility] [place], [address] [place], [place] [address]    Culture (A)  Final    >=100,000 COLONIES/mL MORGANELLA MORGANII SUSCEPTIBILITIES TO FOLLOW Performed at [facility], [address], [place], [place] [address]    Report Status PENDING  Incomplete  Blood culture (routine x 2)     Status: None (Preliminary result)   Collection Time: 06/15/24 12:36 AM   Specimen: BLOOD  Result Value Ref Range Status   Specimen Description BLOOD BLOOD LEFT ARM  Final   Special Requests   Final    BOTTLES DRAWN AEROBIC AND ANAEROBIC Blood Culture results may not be optimal due to an inadequate volume of blood received in culture bottles   Culture   Final    NO GROWTH 1 DAY Performed at [facility], [address], [place], [place] [address]    Report Status PENDING  Incomplete  Blood culture (routine x 2)     Status: None (Preliminary result)   Collection Time: 06/15/24 12:36 AM   Specimen: BLOOD  Result Value Ref Range Status   Specimen Description BLOOD BLOOD LEFT ARM  Final   Special Requests   Final    BOTTLES DRAWN AEROBIC AND ANAEROBIC Blood Culture results may not be optimal due to an inadequate volume of blood received in culture bottles   Culture   Final    NO GROWTH 1 DAY Performed at [facility], [address], [place], [place] [address]    Report Status  PENDING  Incomplete    Procedures and diagnostic studies:  US  Abdomen Limited RUQ (LIVER/GB) Result Date: 06/14/2024 CLINICAL DATA:  Right upper quadrant pain EXAM: ULTRASOUND ABDOMEN LIMITED RIGHT UPPER QUADRANT COMPARISON:  CT abdomen and pelvis same day FINDINGS: Gallbladder: There is a 1.8 cm calculus identified. There is no gallbladder wall thickening or pericholecystic fluid. No sonographic Murphy sign noted by sonographer. Common bile duct: Diameter: 2.4 mm. Liver: No focal lesion identified. Within normal limits in parenchymal echogenicity. Portal vein is patent on color Doppler imaging with normal direction of blood flow towards the liver. Other: None. IMPRESSION: Cholelithiasis without sonographic evidence of acute cholecystitis. Electronically Signed   By: Greig Pique M.D.   On: 06/14/2024 21:31   CT ABDOMEN PELVIS W CONTRAST Result Date: 06/14/2024 CLINICAL DATA:  Right upper and lower quadrant pain. Patient reports cough, vomiting, diarrhea. EXAM: CT ABDOMEN AND PELVIS WITH CONTRAST TECHNIQUE: Multidetector CT imaging of the abdomen and pelvis was performed using the standard protocol following bolus administration of intravenous contrast. RADIATION DOSE REDUCTION: This exam was performed according to the departmental dose-optimization program which includes automated exposure control, adjustment of the mA and/or kV according to patient size and/or use of iterative reconstruction technique. CONTRAST:  OMNIPAQUE  IOHEXOL  300 MG/ML  SOLN COMPARISON:  Remote CT 05/20/2004 reviewed FINDINGS: Lower chest: Patchy ground-glass opacity in the dependent lower lobes. No pleural fluid. Prominent distal esophageal wall thickening with indistinctness of the paraesophageal fat. Hepatobiliary: Scattered subcentimeter hepatic hypodensities, too small to characterize. Noncalcified gallstone in the proximal gallbladder no definite pericholecystic inflammation. No biliary dilatation. Pancreas: No ductal  dilatation or inflammation. Spleen: Normal in size without focal abnormality. Adrenals/Urinary Tract: No adrenal nodule. Cortical scarring in the mid lateral right kidney. No hydronephrosis. No renal calculi. No suspicious renal lesion. Partially distended urinary bladder, normal for degree of distension. Stomach/Bowel: Prominent wall thickening of the distal  esophagus. Indistinctness at the distal paraesophageal fat. No abnormal gastric distension. Fluid throughout the small bowel without obstruction or inflammatory change. Liquid stool within the ascending colon. Formed stool throughout the remainder of the colon. Left colonic diverticulosis. No diverticulitis. The appendix is not confidently visualized, no evidence of appendicitis. Vascular/Lymphatic: Aortic atherosclerosis with irregular calcified noncalcified atheromatous plaque. No aortic aneurysm. Patent portal, splenic, and mesenteric veins. No suspicious lymphadenopathy. Reproductive: Prostate is unremarkable. Other: No free air, free fluid, or intra-abdominal fluid collection. Fat in both inguinal canals. Musculoskeletal: Degenerative change throughout the spine. Right hip arthroplasty IMPRESSION: 1. Prominent wall thickening of the distal esophagus with indistinctness of the paraesophageal fat, reflux versus esophagitis. Recommend correlation with endoscopy to exclude underlying neoplasm. 2. Fluid throughout the small bowel and ascending colon, can be seen with enteritis or diarrheal illness. No bowel obstruction or inflammation. 3. Cholelithiasis without CT findings of acute cholecystitis. 4. Colonic diverticulosis without diverticulitis. 5. Patchy ground-glass opacity in the dependent lower lobes, favor atelectasis, although infection could have a similar appearance in the setting of cough. Aortic Atherosclerosis (ICD10-I70.0). Electronically Signed   By: Andrea Gasman M.D.   On: 06/14/2024 21:18               LOS: 1 day   Darragh Nay  [facility]   Pager on [email or web address]. If 7PM-7AM, please contact night-coverage at [email or web address]     06/16/2024, 1:10 PM

## 2024-06-16 NOTE — Plan of Care (Signed)

## 2024-06-17 DIAGNOSIS — N3000 Acute cystitis without hematuria: Secondary | ICD-10-CM | POA: Diagnosis not present

## 2024-06-17 LAB — URINE CULTURE: Culture: >=100000 — AB

## 2024-06-17 MED ORDER — CIPROFLOXACIN HCL 500 MG PO TABS
500.0000 mg | ORAL_TABLET | Freq: Two times a day (BID) | ORAL | Status: DC
  Administered 2024-06-17 – 2024-06-18 (×3): 500 mg via ORAL
  Filled 2024-06-17 (×3): qty 1

## 2024-06-17 NOTE — TOC Progression Note (Signed)
 Transition of Care [facility]) - Progression Note    Patient Details  Name: MARCKUS HANOVER MRN: [id number] Date of Birth: 08/13/1950  [facility] of [facility]) CM/SW Contact  Layton Naves L [place], [place] Phone Number: 06/17/2024, 8:47 AM  Clinical Narrative:     CSW reviewed Home Health offers with spouse. Spouse was agreeable [facility]. CSW accepted offer in the portal.   Expected Discharge Plan: Home w Home Health Services Barriers to Discharge: Continued Medical Work up               Expected Discharge Plan and Services   Discharge Planning Services: CM Consult Post Acute Care Choice: Home Health Living arrangements for the past 2 months: Single Family Home                                       Social Drivers of Health (SDOH) Interventions SDOH Screenings   Food Insecurity: No Food Insecurity (06/15/2024)  Housing: Low Risk  (06/15/2024)  Transportation Needs: No Transportation Needs (06/15/2024)  Utilities: Not At Risk (06/15/2024)  Financial Resource Strain: Low Risk  (11/18/2023)   Received from [facility]  Social Connections: Socially Isolated (06/15/2024)  Tobacco Use: High Risk (06/14/2024)  Health Literacy: Medium Risk (08/31/2022)   Received from [facility]    Readmission Risk Interventions     No data to display

## 2024-06-17 NOTE — Plan of Care (Signed)
  Problem: Education: Goal: Knowledge of General Education information will improve Description: Including pain rating scale, medication(s)/side effects and non-pharmacologic comfort measures 06/17/2024 1843 by Epifanio Merlynn BIRCH, LPN Outcome: Progressing 06/17/2024 1842 by Epifanio Merlynn BIRCH, LPN Outcome: Progressing   Problem: Health Behavior/Discharge Planning: Goal: Ability to manage health-related needs will improve 06/17/2024 1843 by Epifanio Merlynn BIRCH, LPN Outcome: Progressing 06/17/2024 1842 by Epifanio Merlynn BIRCH, LPN Outcome: Progressing   Problem: Clinical Measurements: Goal: Ability to maintain clinical measurements within normal limits will improve 06/17/2024 1843 by Epifanio Merlynn BIRCH, LPN Outcome: Progressing 06/17/2024 1842 by Epifanio Merlynn BIRCH, LPN Outcome: Progressing Goal: Will remain free from infection 06/17/2024 1843 by Epifanio Merlynn BIRCH, LPN Outcome: Progressing 06/17/2024 1842 by Epifanio Merlynn BIRCH, LPN Outcome: Progressing Goal: Diagnostic test results will improve 06/17/2024 1843 by Epifanio Merlynn BIRCH, LPN Outcome: Progressing 06/17/2024 1842 by Epifanio Merlynn BIRCH, LPN Outcome: Progressing Goal: Respiratory complications will improve 06/17/2024 1843 by Epifanio Merlynn BIRCH, LPN Outcome: Progressing 06/17/2024 1842 by Epifanio Merlynn BIRCH, LPN Outcome: Progressing Goal: Cardiovascular complication will be avoided 06/17/2024 1843 by Epifanio Merlynn BIRCH, LPN Outcome: Progressing 06/17/2024 1842 by Epifanio Merlynn BIRCH, LPN Outcome: Progressing

## 2024-06-17 NOTE — Progress Notes (Signed)
 Mobility Specialist - Progress Note     06/17/24 1629  Mobility  Activity Stood at bedside;Ambulated with assistance  Level of Assistance Moderate assist, patient does 50-74%  Assistive Device Front wheel walker  Distance Ambulated (ft) 30 ft  Range of Motion/Exercises Active Assistive  Activity Response Tolerated well  Mobility Referral Yes  Mobility visit 1 Mobility  Mobility Specialist Start Time (ACUTE ONLY) 1548  Mobility Specialist Stop Time (ACUTE ONLY) 1604  Mobility Specialist Time Calculation (min) (ACUTE ONLY) 16 min   Pt resting in bed on RA upon entry. Pt STS and ambulates to hallway ModA with RW and close hold to prevent falling. Pt required constant cuing to keep walker close and right foot life due to foot drag. Pt returned to bed and left with needs in reach. Chair alarm activated.   Guido Rumble Mobility Specialist 06/17/24, 4:44 PM

## 2024-06-17 NOTE — Care Management Important Message (Signed)
 Important Message  Patient Details  Name: Mark Durham MRN: [id number] Date of Birth: [birth year]   Important Message Given:  Yes - Medicare IM     BRANDY CHRISTIANE ORN, CMA 06/17/2024, 8:20 PM

## 2024-06-17 NOTE — Progress Notes (Addendum)
 PROGRESS NOTE    Mark Durham  FMW:[id number] DOB: [birth year] DOA: 06/14/2024 PCP: [name]  Chief Complaint  Patient presents with   Emesis    Hospital Course:  Mark Durham is a 73 year old male with hypertension, hyperlipidemia, history of CVA and post stroke seizures, right-sided hemiparesis, who presents to the ED with nausea, vomiting, abdominal pain, generalized weakness.  Vital signs WNL on arrival.  CT abdomen pelvis reveals prominent wall thickening of the distal esophagus, fluid throughout the small bowel and ascending colon, and atelectasis.  UA positive for UTI.  Urine culture growing Morganella.  Subjective: Patient endorses some persistent nausea this morning.  Some persistent suprapubic pain though he does report that this is improving.   Objective: Vitals:   06/16/24 1952 06/17/24 0457 06/17/24 0904 06/17/24 1618  BP: (!) 156/108 (!) 160/91 132/82 115/75  Pulse: (!) 110 92 97 85  Resp: 18 18 14 14   Temp: 98.4 F (36.9 C) 98.9 F (37.2 C) 98.5 F (36.9 C) 98.4 F (36.9 C)  TempSrc: Oral Oral Oral Oral  SpO2: 99% 96% 96% 97%  Weight:      Height:        Intake/Output Summary (Last 24 hours) at 06/17/2024 1810 Last data filed at 06/17/2024 1300 Gross per 24 hour  Intake 1230 ml  Output 600 ml  Net 630 ml   Filed Weights   06/14/24 1803 06/15/24 0345  Weight: 102.1 kg 101.5 kg    Examination: General exam: Appears calm and comfortable, NAD  Respiratory system: No work of breathing, symmetric chest wall expansion Cardiovascular system: S1 & S2 heard, RRR.  Gastrointestinal system: Abdomen is nondistended, soft and nontender.  Neuro: Alert and oriented self and situation.  Disoriented to year and president Extremities: Symmetric, expected ROM Skin: No rashes, lesions Psychiatry: Demonstrates appropriate judgement and insight. Mood & affect appropriate for situation.   Assessment & Plan:  Principal Problem:   UTI (urinary tract  infection)   UTI - Urine culture growing Morganella.  Has been on IV ceftriaxone  - Have switched to p.o. ciprofloxacin  today - Repeat CBC in a.m. and ensure resolution of WBC - No prostamegaly on CT  Distal esophageal wall thickening on CT - Differential includes GERD versus possible malignancy - Patient endorses chronic reflux - Continue with PPI and as needed Maalox - GI referral outpatient for EGD  Nausea, vomiting - Improving now - Suspect secondary to infection - Continue to monitor  Generalized weakness - PT/OT recommending home health  Hypertension - Continue home meds, titrate as needed  History of CVA with right-sided hemiparesis Seizure disorder - Hold home meds -- Wife reports some residual confusion at baseline.  Believes he is currently at his mental status baseline  Body mass index is 34.53 kg/m. Obesity Class 1 - Outpatient follow up for lifestyle modification and risk factor management   DVT prophylaxis: lovenox    Code Status: Full Code Disposition:  Inpt pending clinical resolution, hopefully home tomorrow with HH.  Update his wife, Mark Durham, on the phone.  Consultants:    Procedures:    Antimicrobials:  Anti-infectives (From admission, onward)    Start     Dose/Rate Route Frequency Ordered Stop   06/17/24 1030  ciprofloxacin  (CIPRO ) tablet 500 mg        500 mg Oral 2 times daily 06/17/24 0932     06/16/24 0100  cefTRIAXone  (ROCEPHIN ) 1 g in sodium chloride  0.9 % 100 mL IVPB  Status:  Discontinued  1 g 200 mL/hr over 30 Minutes Intravenous Every 24 hours 06/15/24 0320 06/17/24 0932   06/14/24 2315  cefTRIAXone  (ROCEPHIN ) 1 g in sodium chloride  0.9 % 100 mL IVPB        1 g 200 mL/hr over 30 Minutes Intravenous  Once 06/14/24 2306 06/15/24 0209       Data Reviewed: I have personally reviewed following labs and imaging studies CBC: Recent Labs  Lab 06/14/24 1829 06/15/24 0641 06/16/24 0933  WBC 13.6* 14.0* 11.4*  HGB 16.3  14.5 15.7  HCT 50.3 45.4 49.4  MCV 82.7 84.1 83.7  PLT 315 263 256   Basic Metabolic Panel: Recent Labs  Lab 06/14/24 1829 06/15/24 0641  NA 140 143  K 3.8 3.9  CL 101 105  CO2 24 26  GLUCOSE 155* 113*  BUN 18 14  CREATININE 1.19 1.02  CALCIUM  9.6 8.9  MG  --  2.0  PHOS  --  3.3   GFR: Estimated Creatinine Clearance: 72.8 mL/min (by C-G formula based on SCr of 1.02 mg/dL). Liver Function Tests: Recent Labs  Lab 06/14/24 1829 06/15/24 0641  AST 16 14*  ALT 16 12  ALKPHOS 167* 132*  BILITOT 0.9 0.9  PROT 8.6* 7.4  ALBUMIN 4.5 3.9   CBG: No results for input(s): GLUCAP in the last 168 hours.  Recent Results (from the past 240 hours)  Resp panel by RT-PCR (RSV, Flu A&B, Covid) Urine, In & Out Cath     Status: None   Collection Time: 06/14/24  9:50 PM   Specimen: Urine, In & Out Cath; Nasal Swab  Result Value Ref Range Status   SARS Coronavirus 2 by RT PCR NEGATIVE NEGATIVE Final    Comment: (NOTE) SARS-CoV-2 target nucleic acids are NOT DETECTED.  The SARS-CoV-2 RNA is generally detectable in upper respiratory specimens during the acute phase of infection. The lowest concentration of SARS-CoV-2 viral copies this assay can detect is 138 copies/mL. A negative result does not preclude SARS-Cov-2 infection and should not be used as the sole basis for treatment or other patient management decisions. A negative result may occur with  improper specimen collection/handling, submission of specimen other than nasopharyngeal swab, presence of viral mutation(s) within the areas targeted by this assay, and inadequate number of viral copies(<138 copies/mL). A negative result must be combined with clinical observations, patient history, and epidemiological information. The expected result is Negative.  Fact Sheet for Patients:  bloggercourse.com  Fact Sheet for Healthcare Providers:  seriousbroker.it  This test is no t  yet approved or cleared by the [place]  FDA and  has been authorized for detection and/or diagnosis of SARS-CoV-2 by FDA under an Emergency Use Authorization (EUA). This EUA will remain  in effect (meaning this test can be used) for the duration of the COVID-19 declaration under Section 564(b)(1) of the Act, 21 U.S.C.section 360bbb-3(b)(1), unless the authorization is terminated  or revoked sooner.       Influenza A by PCR NEGATIVE NEGATIVE Final   Influenza B by PCR NEGATIVE NEGATIVE Final    Comment: (NOTE) The Xpert Xpress SARS-CoV-2/FLU/RSV plus assay is intended as an aid in the diagnosis of influenza from Nasopharyngeal swab specimens and should not be used as a sole basis for treatment. Nasal washings and aspirates are unacceptable for Xpert Xpress SARS-CoV-2/FLU/RSV testing.  Fact Sheet for Patients: bloggercourse.com  Fact Sheet for Healthcare Providers: seriousbroker.it  This test is not yet approved or cleared by the [place]  FDA and has been  authorized for detection and/or diagnosis of SARS-CoV-2 by FDA under an Emergency Use Authorization (EUA). This EUA will remain in effect (meaning this test can be used) for the duration of the COVID-19 declaration under Section 564(b)(1) of the Act, 21 U.S.C. section 360bbb-3(b)(1), unless the authorization is terminated or revoked.     Resp Syncytial Virus by PCR NEGATIVE NEGATIVE Final    Comment: (NOTE) Fact Sheet for Patients: bloggercourse.com  Fact Sheet for Healthcare Providers: seriousbroker.it  This test is not yet approved or cleared by the [place]  FDA and has been authorized for detection and/or diagnosis of SARS-CoV-2 by FDA under an Emergency Use Authorization (EUA). This EUA will remain in effect (meaning this test can be used) for the duration of the COVID-19 declaration under Section 564(b)(1)  of the Act, 21 U.S.C. section 360bbb-3(b)(1), unless the authorization is terminated or revoked.  Performed at [facility], [address] [place], [place] [address]   Urine Culture     Status: Abnormal   Collection Time: 06/14/24  9:50 PM   Specimen: Urine, Catheterized  Result Value Ref Range Status   Specimen Description   Final    URINE, CATHETERIZED Performed at [facility], [address], [place], [place] [address]    Special Requests   Final    NONE Performed at [facility], [address] [place], [place] [address]    Culture >=100,000 COLONIES/mL [facility] MORGANII (A)  Final   Report Status 06/17/2024 FINAL  Final   Organism ID, Bacteria MORGANELLA MORGANII (A)  Final      Susceptibility   Morganella morganii - MIC*    AMPICILLIN >=32 RESISTANT Resistant     ERTAPENEM <=0.12 SENSITIVE Sensitive     CIPROFLOXACIN  <=0.06 SENSITIVE Sensitive     GENTAMICIN <=1 SENSITIVE Sensitive     NITROFURANTOIN 128 RESISTANT Resistant     TRIMETH/SULFA <=20 SENSITIVE Sensitive     AMPICILLIN/SULBACTAM >=32 RESISTANT Resistant     PIP/TAZO Value in next row Sensitive      <=4 SENSITIVEThis is a modified FDA-approved test that has been validated and its performance characteristics determined by the reporting laboratory.  This laboratory is certified under the Clinical Laboratory Improvement Amendments CLIA as qualified to perform high complexity clinical laboratory testing.    MEROPENEM Value in next row Sensitive      <=4 SENSITIVEThis is a modified FDA-approved test that has been validated and its performance characteristics determined by the reporting laboratory.  This laboratory is certified under the Clinical Laboratory Improvement Amendments CLIA as qualified to perform high complexity clinical laboratory testing.    * >=100,000 COLONIES/mL MORGANELLA MORGANII  Blood culture (routine x 2)     Status: None (Preliminary result)   Collection Time:  06/15/24 12:36 AM   Specimen: BLOOD  Result Value Ref Range Status   Specimen Description BLOOD BLOOD LEFT ARM  Final   Special Requests   Final    BOTTLES DRAWN AEROBIC AND ANAEROBIC Blood Culture results may not be optimal due to an inadequate volume of blood received in culture bottles   Culture   Final    NO GROWTH 2 DAYS Performed at [facility], [address] [place], [place] [address]    Report Status PENDING  Incomplete  Blood culture (routine x 2)     Status: None (Preliminary result)   Collection Time: 06/15/24 12:36 AM   Specimen: BLOOD  Result Value Ref Range Status   Specimen Description BLOOD BLOOD LEFT  ARM  Final   Special Requests   Final    BOTTLES DRAWN AEROBIC AND ANAEROBIC Blood Culture results may not be optimal due to an inadequate volume of blood received in culture bottles   Culture   Final    NO GROWTH 2 DAYS Performed at [facility], [address] [place], [place] [address]    Report Status PENDING  Incomplete     Radiology Studies: No results found.  Scheduled Meds:  amLODipine   10 mg Oral Daily   aspirin  EC  81 mg Oral Daily   atorvastatin   80 mg Oral Daily   ciprofloxacin   500 mg Oral BID   enoxaparin  (LOVENOX ) injection  0.5 mg/kg Subcutaneous Q24H   lacosamide   200 mg Oral BID   pantoprazole   40 mg Oral Daily   Continuous Infusions:   LOS: 2 days  MDM: Patient is high risk for one or more organ failure.  They necessitate ongoing hospitalization for continued IV therapies and subsequent lab monitoring. Total time spent interpreting labs and vitals, reviewing the medical record, coordinating care amongst consultants and care team members, directly assessing and discussing care with the patient and/or family: 55 min Artur Winningham, DO [facility]  To contact the attending physician between 7A-7P please use Epic Chat. To contact the covering physician during after hours 7P-7A, please review Amion.  06/17/2024,  6:10 PM   *This document has been created with the assistance of dictation software. Please excuse typographical errors. *

## 2024-06-18 ENCOUNTER — Other Ambulatory Visit: Payer: Self-pay

## 2024-06-18 DIAGNOSIS — N3000 Acute cystitis without hematuria: Secondary | ICD-10-CM | POA: Diagnosis not present

## 2024-06-18 LAB — CBC
HCT: 46.9 % (ref 39.0–52.0)
Hemoglobin: 14.7 g/dL (ref 13.0–17.0)
MCH: 27.1 pg (ref 26.0–34.0)
MCHC: 31.3 g/dL (ref 30.0–36.0)
MCV: 86.5 fL (ref 80.0–100.0)
Platelets: 259 K/uL (ref 150–400)
RBC: 5.42 MIL/uL (ref 4.22–5.81)
RDW: 13.4 % (ref 11.5–15.5)
WBC: 11.2 K/uL — ABNORMAL HIGH (ref 4.0–10.5)
nRBC: 0 % (ref 0.0–0.2)

## 2024-06-18 MED ORDER — CIPROFLOXACIN HCL 500 MG PO TABS
500.0000 mg | ORAL_TABLET | Freq: Two times a day (BID) | ORAL | 0 refills | Status: AC
  Filled 2024-06-18: qty 10, 5d supply, fill #0

## 2024-06-18 NOTE — Progress Notes (Signed)
 Physical Therapy Treatment Patient Details Name: Mark Durham MRN: [id number] DOB: [birth year] Today's Date: 06/18/2024   History of Present Illness Mark Durham is a 73 y.o. 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    PT Comments  Pt progressed with mobility training performing stair negotiation with CGA .  Overall, with  basic mobility, pt requires CGA to Min A.  Pt continues to experience limitations to mobility, demonstrating apraxic, slow movement patterns with all mobility tasks and poor balance reactions demonstrating difficulty coordinating weight shifts back into midline with perturbations. Pt will benefit from continued PT services upon discharge to safely address deficits listed in patient problem list for decreased caregiver assistance and eventual return to PLOF.      If plan is discharge home, recommend the following: A little help with walking and/or transfers;A little help with bathing/dressing/bathroom   Can travel by private vehicle        Equipment Recommendations  None recommended by PT    Recommendations for Other Services       Precautions / Restrictions Precautions Precautions: Fall Recall of Precautions/Restrictions: Intact Restrictions Weight Bearing Restrictions Per Provider Order: No     Mobility  Bed Mobility Overal bed mobility: Needs Assistance Bed Mobility: Supine to Sit     Supine to sit: Used rails, Min assist     General bed mobility comments: difficulty weight shifting from right to left side to fully transition from right sidelying to sitting midline.     Transfers Overall transfer level: Needs assistance Equipment used: Rolling walker (2 wheels) Transfers: Sit to/from Stand, Bed to chair/wheelchair/BSC Sit to Stand: Contact guard assist, Min assist   Step pivot transfers: Contact guard assist       General transfer comment: min A when having difficulty scooting close enough to edge of chair.  Has a hard time shifting weight posterior to midline.    Ambulation/Gait Ambulation/Gait assistance: Contact guard assist Gait Distance (Feet): 30 Feet Assistive device: Rolling walker (2 wheels) Gait Pattern/deviations: Step-to pattern, Decreased stride length, Decreased weight shift to right, Decreased dorsiflexion - right, Decreased stance time - right, Decreased step length - left       General Gait Details: notable R toe drag; vc to keep R foot within RW BOS   Stairs Stairs: Yes Stairs assistance: Contact guard assist Stair Management: Two rails, Step to pattern Number of Stairs: 4     Wheelchair Mobility     Tilt Bed    Modified Rankin (Stroke Patients Only)       Balance Overall balance assessment: Needs assistance Sitting-balance support: No upper extremity supported, Feet supported Sitting balance-Leahy Scale: Good Sitting balance - Comments: steady static sitting, reaching inside BOS.   Standing balance support: Bilateral upper extremity supported, Reliant on assistive device for balance, During functional activity Standing balance-Leahy Scale: Fair Standing balance comment: poor righting reactions with imbalance; difficulty coming back into midline requiring occasional Min A.                            Communication Communication Communication: No apparent difficulties  Cognition Arousal: Alert Behavior During  Therapy: WFL for tasks assessed/performed   PT - Cognitive impairments: Difficult to assess, Awareness                         Following commands: Impaired Following  commands impaired: Follows one step commands with increased time    Cueing Cueing Techniques: Verbal cues, Gestural cues, Tactile cues  Exercises      General Comments        Pertinent Vitals/Pain Pain Assessment Pain Assessment: No/denies pain    Home Living                          Prior Function            PT Goals (current goals can now be found in the care plan section) Acute Rehab PT Goals Patient Stated Goal: to return home PT Goal Formulation: With patient Time For Goal Achievement: 06/29/24 Potential to Achieve Goals: Good Progress towards PT goals: Progressing toward goals    Frequency    Min 2X/week      PT Plan      Co-evaluation              AM-PAC PT 6 Clicks Mobility   Outcome Measure  Help needed turning from your back to your side while in a flat bed without using bedrails?: A Little Help needed moving from lying on your back to sitting on the side of a flat bed without using bedrails?: A Little Help needed moving to and from a bed to a chair (including a wheelchair)?: A Little Help needed standing up from a chair using your arms (e.g., wheelchair or bedside chair)?: A Little Help needed to walk in hospital room?: A Little Help needed climbing 3-5 steps with a railing? : A Little 6 Click Score: 18    End of Session Equipment Utilized During Treatment: Gait belt Activity Tolerance: Patient tolerated treatment well Patient left: in chair;with chair alarm set;with family/visitor present;with call bell/phone within reach Nurse Communication: Mobility status PT Visit Diagnosis: Unsteadiness on feet (R26.81);Other abnormalities of gait and mobility (R26.89);Difficulty in walking, not elsewhere classified (R26.2)     Time: 0924-1000 PT Time Calculation (min) (ACUTE ONLY): 36 min  Charges:    $Gait Training: 8-22 mins $Therapeutic Activity: 8-22 mins PT General Charges $$ ACUTE PT VISIT: 1 Visit                      Harland Irving, PTA  06/18/24, 10:20 AM

## 2024-06-18 NOTE — Progress Notes (Signed)
  Progress Note   Date: 06/18/2024  Patient Name: Mark Durham        MRN#: [id number]    Sepsis was present on admission (at the time of the admission order)

## 2024-06-18 NOTE — Discharge Summary (Signed)
 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  Vital signs WNL on arrival.  CT abdomen pelvis reveals prominent wall thickening of the distal esophagus, fluid throughout the small bowel and ascending colon, and atelectasis.  UA positive for UTI.  Urine culture growing Morganella. Patient was switched to prophylaxis and will complete the course outpatient.  By 12/8 he was endorsing that he is back to his baseline and ready to discharge home. Care plan discussed with his wife who is in agreement.   UTI - Urine culture growing Morganella.  Has been on IV ceftriaxone , switched to p.o. ciprofloxacin  12/7.  Will complete 5-day course outpatient. - Medications delivered meds to beds prior to DC - No prostamegaly on CT   Distal esophageal wall thickening on CT - Differential includes GERD versus possible malignancy - Patient endorses chronic reflux - Continue with PPI and as needed Maalox - Placed GI referral outpatient for EGD   Nausea, vomiting - Improving now - Suspect secondary to infection - Continue to monitor  Atypical chest pain - EKG reassuring - Chest pain is reproducible upon palpation and largely in the epigastrium.  Suspect this is related to his GERD.  Treatment as above   Generalized weakness - PT/OT recommending home health, ordered    Hypertension - Continue home meds   History of CVA with right-sided hemiparesis Seizure disorder - Cont home meds -- Wife reports some residual confusion at baseline.  Believes he is currently at his mental status baseline   Body mass index is 34.53 kg/m. Obesity Class 1 - Outpatient follow up for lifestyle modification and risk factor management    Discharge Instructions  Discharge Instructions     Ambulatory referral to Gastroenterology   Complete by: As directed    CT with esophageal wall thickening, needs EGD to r/o neoplasm. Has significant GERD   What is the reason for referral?: Other Comment - EGD   Call MD for:  difficulty breathing, headache or visual disturbances   Complete by: As directed    Call MD for:  persistant dizziness or light-headedness   Complete by: As directed    Call MD for:  persistant nausea and vomiting   Complete by: As directed    Call MD for:  severe uncontrolled pain   Complete by: As directed    Call MD for:  temperature >100.4   Complete by: As directed    Diet - low sodium heart healthy   Complete by: As directed    Discharge instructions   Complete by: As directed    You have been referred to gastroenterology to have an EGD to better follow-up on your GERD and CT scan findings.  Please make this appointment and follow-up   Increase activity slowly   Complete by: As directed       Allergies as of 06/18/2024   No Known Allergies      Medication List     STOP  taking these medications    potassium chloride  SA 20 MEQ tablet Commonly known as: KLOR-CON  M   tadalafil  20 MG tablet Commonly known as: CIALIS        TAKE these medications    acetaminophen  500 MG tablet Commonly known as: TYLENOL  Take 1,000 mg by mouth every 6 (six) hours as needed for moderate pain (as needed for pain). Take 2 tablets 3x per day as needed for pain   amLODipine  10 MG tablet Commonly known as: NORVASC  Take 10 mg by mouth daily.   aspirin  EC  81 MG tablet Take 81 mg by mouth daily. Swallow whole.   atorvastatin  80 MG tablet Commonly known as: LIPITOR Take 80 mg by mouth daily. Take 1 tablet by mouth daily   ciprofloxacin  500 MG tablet Commonly known as: CIPRO  Take 1 tablet (500 mg total) by mouth 2 (two) times daily for 5 days.   iron  polysaccharides 150 MG capsule Commonly known as: NIFEREX Take 150 mg by mouth daily.   lacosamide  200 MG Tabs tablet Commonly known as: VIMPAT  Take 200 mg by mouth 2 (two) times daily. What changed: Another medication with the same name was removed. Continue taking this medication, and follow the directions you see here.   omeprazole 20 MG tablet Commonly known as: PRILOSEC OTC Take 20 mg by mouth daily. Take 1 tablet by mouth daily as needed.   polyethylene glycol 17 g packet Commonly known as: MIRALAX  / GLYCOLAX  Take 17 g by mouth daily. Drink by mouth daily as needed.   senna 8.6 MG Tabs tablet Commonly known as: SENOKOT Take 2 tablets (17.2 mg total) by mouth at bedtime as needed for mild constipation. Take 2 tablets by mouth nightly.        Contact information for follow-up providers     [place] Follow up.   Why: hospital follow up Contact information: [address] [name] [address] [phone]              Contact information for after-discharge care     Home Medical Care     [facility]) .   Service: Home Health Services Contact information: [address] [place]  [address] [phone]                    No Known Allergies  Consultations:    Procedures/Studies: US  Abdomen Limited RUQ (LIVER/GB) Result Date: 06/14/2024 CLINICAL DATA:  Right upper quadrant pain EXAM: ULTRASOUND ABDOMEN LIMITED RIGHT UPPER QUADRANT COMPARISON:  CT abdomen and pelvis same day FINDINGS: Gallbladder: There is a 1.8 cm calculus identified. There is no gallbladder wall thickening or  pericholecystic fluid. No sonographic Murphy sign noted by sonographer. Common bile duct: Diameter: 2.4 mm. Liver: No focal lesion identified. Within normal limits in parenchymal echogenicity. Portal vein is patent on color Doppler imaging with normal direction of blood flow towards the liver. Other: None. IMPRESSION: Cholelithiasis without sonographic evidence of acute cholecystitis. Electronically Signed   By: Greig Pique M.D.   On: 06/14/2024 21:31   CT ABDOMEN PELVIS W CONTRAST Result Date: 06/14/2024 CLINICAL DATA:  Right upper and lower quadrant pain. Patient reports cough, vomiting, diarrhea. EXAM: CT ABDOMEN AND PELVIS WITH CONTRAST TECHNIQUE: Multidetector CT imaging of the abdomen and pelvis was performed using the standard protocol following bolus administration of intravenous contrast. RADIATION DOSE REDUCTION: This exam was performed according to the departmental dose-optimization program which includes automated exposure control, adjustment of  the mA and/or kV according to patient size and/or use of iterative reconstruction technique. CONTRAST:  OMNIPAQUE  IOHEXOL  300 MG/ML  SOLN COMPARISON:  Remote CT 05/20/2004 reviewed FINDINGS: Lower chest: Patchy ground-glass opacity in the dependent lower lobes. No pleural fluid. Prominent distal esophageal wall thickening with indistinctness of the paraesophageal fat. Hepatobiliary: Scattered subcentimeter hepatic hypodensities, too small to characterize. Noncalcified gallstone in the proximal gallbladder no definite pericholecystic inflammation. No biliary dilatation. Pancreas: No ductal dilatation or inflammation. Spleen: Normal in size without focal abnormality. Adrenals/Urinary Tract: No adrenal nodule. Cortical scarring in the mid lateral right kidney. No hydronephrosis. No renal calculi. No suspicious renal lesion. Partially distended urinary bladder, normal for degree of distension. Stomach/Bowel: Prominent wall thickening of the distal  esophagus. Indistinctness at the distal paraesophageal fat. No abnormal gastric distension. Fluid throughout the small bowel without obstruction or inflammatory change. Liquid stool within the ascending colon. Formed stool throughout the remainder of the colon. Left colonic diverticulosis. No diverticulitis. The appendix is not confidently visualized, no evidence of appendicitis. Vascular/Lymphatic: Aortic atherosclerosis with irregular calcified noncalcified atheromatous plaque. No aortic aneurysm. Patent portal, splenic, and mesenteric veins. No suspicious lymphadenopathy. Reproductive: Prostate is unremarkable. Other: No free air, free fluid, or intra-abdominal fluid collection. Fat in both inguinal canals. Musculoskeletal: Degenerative change throughout the spine. Right hip arthroplasty IMPRESSION: 1. Prominent wall thickening of the distal esophagus with indistinctness of the paraesophageal fat, reflux versus esophagitis. Recommend correlation with endoscopy to exclude underlying neoplasm. 2. Fluid throughout the small bowel and ascending colon, can be seen with enteritis or diarrheal illness. No bowel obstruction or inflammation. 3. Cholelithiasis without CT findings of acute cholecystitis. 4. Colonic diverticulosis without diverticulitis. 5. Patchy ground-glass opacity in the dependent lower lobes, favor atelectasis, although infection could have a similar appearance in the setting of cough. Aortic Atherosclerosis (ICD10-I70.0). Electronically Signed   By: Andrea Gasman M.D.   On: 06/14/2024 21:18      Discharge Exam: Vitals:   06/18/24 0341 06/18/24 1137  BP: 139/89 (!) 147/85  Pulse: 88 81  Resp: 16 18  Temp: 98.3 F (36.8 C) (!) 97.5 F (36.4 C)  SpO2: 95% 99%   Vitals:   06/17/24 1618 06/17/24 2026 06/18/24 0341 06/18/24 1137  BP: 115/75 134/73 139/89 (!) 147/85  Pulse: 85 84 88 81  Resp: 14 19 16 18   Temp: 98.4 F (36.9 C) 99 F (37.2 C) 98.3 F (36.8 C) (!) 97.5 F (36.4 C)   TempSrc: Oral Oral  Oral  SpO2: 97% 98% 95% 99%  Weight:      Height:        Constitutional:  Normal appearance. Non toxic-appearing.  HENT: Head Normocephalic and atraumatic.  Mucous membranes are moist.  Eyes:  Extraocular intact. Conjunctivae normal.  Cardiovascular: Rate and Rhythm: Normal rate and regular rhythm.  Pulmonary: Non labored, symmetric rise of chest wall.  Skin: warm and dry. not jaundiced.  Neurological:Alert, oriented to self and situation.  Psychiatric: Mood and Affect congruent.    The results of significant diagnostics from this hospitalization (including imaging, microbiology, ancillary and laboratory) are listed below for reference.     Microbiology: Recent Results (from the past 240 hours)  Resp panel by RT-PCR (RSV, Flu A&B, Covid) Urine, In & Out Cath     Status: None   Collection Time: 06/14/24  9:50 PM   Specimen: Urine, In & Out Cath; Nasal Swab  Result Value Ref Range Status   SARS Coronavirus 2 by RT PCR NEGATIVE NEGATIVE  Final    Comment: (NOTE) SARS-CoV-2 target nucleic acids are NOT DETECTED.  The SARS-CoV-2 RNA is generally detectable in upper respiratory specimens during the acute phase of infection. The lowest concentration of SARS-CoV-2 viral copies this assay can detect is 138 copies/mL. A negative result does not preclude SARS-Cov-2 infection and should not be used as the sole basis for treatment or other patient management decisions. A negative result may occur with  improper specimen collection/handling, submission of specimen other than nasopharyngeal swab, presence of viral mutation(s) within the areas targeted by this assay, and inadequate number of viral copies(<138 copies/mL). A negative result must be combined with clinical observations, patient history, and epidemiological information. The expected result is Negative.  Fact Sheet for Patients:  bloggercourse.com  Fact Sheet for Healthcare  Providers:  seriousbroker.it  This test is no t yet approved or cleared by the [place]  FDA and  has been authorized for detection and/or diagnosis of SARS-CoV-2 by FDA under an Emergency Use Authorization (EUA). This EUA will remain  in effect (meaning this test can be used) for the duration of the COVID-19 declaration under Section 564(b)(1) of the Act, 21 U.S.C.section 360bbb-3(b)(1), unless the authorization is terminated  or revoked sooner.       Influenza A by PCR NEGATIVE NEGATIVE Final   Influenza B by PCR NEGATIVE NEGATIVE Final    Comment: (NOTE) The Xpert Xpress SARS-CoV-2/FLU/RSV plus assay is intended as an aid in the diagnosis of influenza from Nasopharyngeal swab specimens and should not be used as a sole basis for treatment. Nasal washings and aspirates are unacceptable for Xpert Xpress SARS-CoV-2/FLU/RSV testing.  Fact Sheet for Patients: bloggercourse.com  Fact Sheet for Healthcare Providers: seriousbroker.it  This test is not yet approved or cleared by the [place]  FDA and has been authorized for detection and/or diagnosis of SARS-CoV-2 by FDA under an Emergency Use Authorization (EUA). This EUA will remain in effect (meaning this test can be used) for the duration of the COVID-19 declaration under Section 564(b)(1) of the Act, 21 U.S.C. section 360bbb-3(b)(1), unless the authorization is terminated or revoked.     Resp Syncytial Virus by PCR NEGATIVE NEGATIVE Final    Comment: (NOTE) Fact Sheet for Patients: bloggercourse.com  Fact Sheet for Healthcare Providers: seriousbroker.it  This test is not yet approved or cleared by the [place]  FDA and has been authorized for detection and/or diagnosis of SARS-CoV-2 by FDA under an Emergency Use Authorization (EUA). This EUA will remain in effect (meaning this test can be  used) for the duration of the COVID-19 declaration under Section 564(b)(1) of the Act, 21 U.S.C. section 360bbb-3(b)(1), unless the authorization is terminated or revoked.  Performed at [facility], [address] [place], [place] [address]   Urine Culture     Status: Abnormal   Collection Time: 06/14/24  9:50 PM   Specimen: Urine, Catheterized  Result Value Ref Range Status   Specimen Description   Final    URINE, CATHETERIZED Performed at [facility], [address], [place], [place] [address]    Special Requests   Final    NONE Performed at [facility], [address] [place], [place] [address]    Culture >=100,000 COLONIES/mL [facility] MORGANII (A)  Final   Report Status 06/17/2024 FINAL  Final   Organism ID, Bacteria MORGANELLA MORGANII (A)  Final      Susceptibility   Morganella morganii - MIC*    AMPICILLIN >=32 RESISTANT Resistant  ERTAPENEM <=0.12 SENSITIVE Sensitive     CIPROFLOXACIN  <=0.06 SENSITIVE Sensitive     GENTAMICIN <=1 SENSITIVE Sensitive     NITROFURANTOIN 128 RESISTANT Resistant     TRIMETH/SULFA <=20 SENSITIVE Sensitive     AMPICILLIN/SULBACTAM >=32 RESISTANT Resistant     PIP/TAZO Value in next row Sensitive      <=4 SENSITIVEThis is a modified FDA-approved test that has been validated and its performance characteristics determined by the reporting laboratory.  This laboratory is certified under the Clinical Laboratory Improvement Amendments CLIA as qualified to perform high complexity clinical laboratory testing.    MEROPENEM Value in next row Sensitive      <=4 SENSITIVEThis is a modified FDA-approved test that has been validated and its performance characteristics determined by the reporting laboratory.  This laboratory is certified under the Clinical Laboratory Improvement Amendments CLIA as qualified to perform high complexity clinical laboratory testing.    * >=100,000 COLONIES/mL MORGANELLA MORGANII  Blood  culture (routine x 2)     Status: None (Preliminary result)   Collection Time: 06/15/24 12:36 AM   Specimen: BLOOD  Result Value Ref Range Status   Specimen Description BLOOD BLOOD LEFT ARM  Final   Special Requests   Final    BOTTLES DRAWN AEROBIC AND ANAEROBIC Blood Culture results may not be optimal due to an inadequate volume of blood received in culture bottles   Culture   Final    NO GROWTH 3 DAYS Performed at [facility], [address] [place], [place] [address]    Report Status PENDING  Incomplete  Blood culture (routine x 2)     Status: None (Preliminary result)   Collection Time: 06/15/24 12:36 AM   Specimen: BLOOD  Result Value Ref Range Status   Specimen Description BLOOD BLOOD LEFT ARM  Final   Special Requests   Final    BOTTLES DRAWN AEROBIC AND ANAEROBIC Blood Culture results may not be optimal due to an inadequate volume of blood received in culture bottles   Culture   Final    NO GROWTH 3 DAYS Performed at [facility], [address] [place], [place] [address]    Report Status PENDING  Incomplete     Labs: BNP (last 3 results) No results for input(s): BNP in the last 8760 hours. Basic Metabolic Panel: Recent Labs  Lab 06/14/24 1829 06/15/24 0641  NA 140 143  K 3.8 3.9  CL 101 105  CO2 24 26  GLUCOSE 155* 113*  BUN 18 14  CREATININE 1.19 1.02  CALCIUM  9.6 8.9  MG  --  2.0  PHOS  --  3.3   Liver Function Tests: Recent Labs  Lab 06/14/24 1829 06/15/24 0641  AST 16 14*  ALT 16 12  ALKPHOS 167* 132*  BILITOT 0.9 0.9  PROT 8.6* 7.4  ALBUMIN 4.5 3.9   Recent Labs  Lab 06/14/24 1829  LIPASE 14   No results for input(s): AMMONIA in the last 168 hours. CBC: Recent Labs  Lab 06/14/24 1829 06/15/24 0641 06/16/24 0933 06/18/24 0313  WBC 13.6* 14.0* 11.4* 11.2*  HGB 16.3 14.5 15.7 14.7  HCT 50.3 45.4 49.4 46.9  MCV 82.7 84.1 83.7 86.5  PLT 315 263 256 259   Cardiac Enzymes: No results for input(s):  CKTOTAL, CKMB, CKMBINDEX, TROPONINI in the last 168 hours. BNP: Invalid input(s): POCBNP CBG: No results for input(s): GLUCAP in the last 168 hours. D-Dimer No results for input(s): DDIMER in the last 72 hours. Hgb  A1c No results for input(s): HGBA1C in the last 72 hours. Lipid Profile No results for input(s): CHOL, HDL, LDLCALC, TRIG, CHOLHDL, LDLDIRECT in the last 72 hours. Thyroid  function studies No results for input(s): TSH, T4TOTAL, T3FREE, THYROIDAB in the last 72 hours.  Invalid input(s): FREET3 Anemia work up No results for input(s): VITAMINB12, FOLATE, FERRITIN, TIBC, IRON , RETICCTPCT in the last 72 hours. Urinalysis    Component Value Date/Time   COLORURINE YELLOW (A) 06/14/2024 2150   APPEARANCEUR HAZY (A) 06/14/2024 2150   APPEARANCEUR Clear 04/15/2021 0908   LABSPEC >1.046 (H) 06/14/2024 2150   PHURINE 6.0 06/14/2024 2150   GLUCOSEU NEGATIVE 06/14/2024 2150   HGBUR MODERATE (A) 06/14/2024 2150   BILIRUBINUR NEGATIVE 06/14/2024 2150   BILIRUBINUR Negative 04/15/2021 0908   KETONESUR NEGATIVE 06/14/2024 2150   PROTEINUR 30 (A) 06/14/2024 2150   NITRITE POSITIVE (A) 06/14/2024 2150   LEUKOCYTESUR NEGATIVE 06/14/2024 2150   Sepsis Labs Recent Labs  Lab 06/14/24 1829 06/15/24 0641 06/16/24 0933 06/18/24 0313  WBC 13.6* 14.0* 11.4* 11.2*   Microbiology Recent Results (from the past 240 hours)  Resp panel by RT-PCR (RSV, Flu A&B, Covid) Urine, In & Out Cath     Status: None   Collection Time: 06/14/24  9:50 PM   Specimen: Urine, In & Out Cath; Nasal Swab  Result Value Ref Range Status   SARS Coronavirus 2 by RT PCR NEGATIVE NEGATIVE Final    Comment: (NOTE) SARS-CoV-2 target nucleic acids are NOT DETECTED.  The SARS-CoV-2 RNA is generally detectable in upper respiratory specimens during the acute phase of infection. The lowest concentration of SARS-CoV-2 viral copies this assay can detect is 138  copies/mL. A negative result does not preclude SARS-Cov-2 infection and should not be used as the sole basis for treatment or other patient management decisions. A negative result may occur with  improper specimen collection/handling, submission of specimen other than nasopharyngeal swab, presence of viral mutation(s) within the areas targeted by this assay, and inadequate number of viral copies(<138 copies/mL). A negative result must be combined with clinical observations, patient history, and epidemiological information. The expected result is Negative.  Fact Sheet for Patients:  bloggercourse.com  Fact Sheet for Healthcare Providers:  seriousbroker.it  This test is no t yet approved or cleared by the [place]  FDA and  has been authorized for detection and/or diagnosis of SARS-CoV-2 by FDA under an Emergency Use Authorization (EUA). This EUA will remain  in effect (meaning this test can be used) for the duration of the COVID-19 declaration under Section 564(b)(1) of the Act, 21 U.S.C.section 360bbb-3(b)(1), unless the authorization is terminated  or revoked sooner.       Influenza A by PCR NEGATIVE NEGATIVE Final   Influenza B by PCR NEGATIVE NEGATIVE Final    Comment: (NOTE) The Xpert Xpress SARS-CoV-2/FLU/RSV plus assay is intended as an aid in the diagnosis of influenza from Nasopharyngeal swab specimens and should not be used as a sole basis for treatment. Nasal washings and aspirates are unacceptable for Xpert Xpress SARS-CoV-2/FLU/RSV testing.  Fact Sheet for Patients: bloggercourse.com  Fact Sheet for Healthcare Providers: seriousbroker.it  This test is not yet approved or cleared by the [place]  FDA and has been authorized for detection and/or diagnosis of SARS-CoV-2 by FDA under an Emergency Use Authorization (EUA). This EUA will remain in effect (meaning  this test can be used) for the duration of the COVID-19 declaration under Section 564(b)(1) of the Act, 21 U.S.C. section 360bbb-3(b)(1), unless the authorization  is terminated or revoked.     Resp Syncytial Virus by PCR NEGATIVE NEGATIVE Final    Comment: (NOTE) Fact Sheet for Patients: bloggercourse.com  Fact Sheet for Healthcare Providers: seriousbroker.it  This test is not yet approved or cleared by the [place]  FDA and has been authorized for detection and/or diagnosis of SARS-CoV-2 by FDA under an Emergency Use Authorization (EUA). This EUA will remain in effect (meaning this test can be used) for the duration of the COVID-19 declaration under Section 564(b)(1) of the Act, 21 U.S.C. section 360bbb-3(b)(1), unless the authorization is terminated or revoked.  Performed at [facility], [address] [place], [place] [address]   Urine Culture     Status: Abnormal   Collection Time: 06/14/24  9:50 PM   Specimen: Urine, Catheterized  Result Value Ref Range Status   Specimen Description   Final    URINE, CATHETERIZED Performed at [facility], [address] [place], [place] [address]    Special Requests   Final    NONE Performed at [facility], [address] [place], [place] [address]    Culture >=100,000 COLONIES/mL [facility] MORGANII (A)  Final   Report Status 06/17/2024 FINAL  Final   Organism ID, Bacteria MORGANELLA MORGANII (A)  Final      Susceptibility   Morganella morganii - MIC*    AMPICILLIN >=32 RESISTANT Resistant     ERTAPENEM <=0.12 SENSITIVE Sensitive     CIPROFLOXACIN  <=0.06 SENSITIVE Sensitive     GENTAMICIN <=1 SENSITIVE Sensitive     NITROFURANTOIN 128 RESISTANT Resistant     TRIMETH/SULFA <=20 SENSITIVE Sensitive     AMPICILLIN/SULBACTAM >=32 RESISTANT Resistant     PIP/TAZO Value in next row Sensitive      <=4 SENSITIVEThis is a modified FDA-approved test  that has been validated and its performance characteristics determined by the reporting laboratory.  This laboratory is certified under the Clinical Laboratory Improvement Amendments CLIA as qualified to perform high complexity clinical laboratory testing.    MEROPENEM Value in next row Sensitive      <=4 SENSITIVEThis is a modified FDA-approved test that has been validated and its performance characteristics determined by the reporting laboratory.  This laboratory is certified under the Clinical Laboratory Improvement Amendments CLIA as qualified to perform high complexity clinical laboratory testing.    * >=100,000 COLONIES/mL MORGANELLA MORGANII  Blood culture (routine x 2)     Status: None (Preliminary result)   Collection Time: 06/15/24 12:36 AM   Specimen: BLOOD  Result Value Ref Range Status   Specimen Description BLOOD BLOOD LEFT ARM  Final   Special Requests   Final    BOTTLES DRAWN AEROBIC AND ANAEROBIC Blood Culture results may not be optimal due to an inadequate volume of blood received in culture bottles   Culture   Final    NO GROWTH 3 DAYS Performed at [facility], [address] [place], [place] [address]    Report Status PENDING  Incomplete  Blood culture (routine x 2)     Status: None (Preliminary result)   Collection Time: 06/15/24 12:36 AM   Specimen: BLOOD  Result Value Ref Range Status   Specimen Description BLOOD BLOOD LEFT ARM  Final   Special Requests   Final    BOTTLES DRAWN AEROBIC AND ANAEROBIC Blood Culture results may not be optimal due to an inadequate volume of blood received in culture bottles   Culture   Final    NO GROWTH 3  DAYS Performed at [facility], [address] [place], [place] [address]    Report Status PENDING  Incomplete     Time coordinating discharge: 32 min  SIGNED: Lorane Poland, DO [facility] 06/18/2024, 1:02 PM Pager   If 7PM-7AM, please contact night-coverage

## 2024-06-20 LAB — CULTURE, BLOOD (ROUTINE X 2)
Culture: NO GROWTH
Culture: NO GROWTH

## 2024-08-07 ENCOUNTER — Other Ambulatory Visit: Payer: Self-pay

## 2024-08-08 NOTE — Progress Notes (Unsigned)
 "   08/09/2024 Mark Durham [id number] January 27, 1951  Gastroenterology Office Note    Referring Provider: Leesa Kast, DO Primary Care Physician:  [name]  Primary GI Provider: Celestia Rima, NP; Jinny Carmine, MD    Chief Complaint   Chief Complaint  Patient presents with   New Patient (Initial Visit)    Thalia- worse after stroke-aspirates sometimes- difficulty swallowing feels like food gets stuck     History of Present Illness   Mark Durham is a 74 y.o. male with PMHX of CVA, TIA, Barretts esophagus presenting today at the request of Leesa Kast, DO due to GERD.  Discussed the use of AI scribe software for clinical note transcription with the patient, who gave verbal consent to proceed.  Accompanied by wife today. Progressive difficulty swallowing, primarily with food, has been present. Coughing and a sensation of strangling occur during meals and with drinking, with coughing most of the time while eating. Trouble is noted with the initial swallow. No vomiting, food impaction, or difficulty swallowing medications. His wife assists by cutting up food and sandwiches. No recent speech therapy since his major stroke; last swallow evaluation was after that event.  During a four-day hospitalization last month for urinary tract infection, aspiration with eating and drinking was discovered. Imaging at that time showed possible esophageal wall thickening, and upper endoscopy was recommended.  History of heartburn and reflux, currently well controlled with omeprazole once daily. No current heartburn or reflux symptoms. No abdominal pain, nausea, vomiting, or diarrhea. Patient avoids tomatoes and acidic foods; fried or greasy foods are eaten occasionally. Sodas are infrequent; drinks primarily Kool-Aid and water. Rare use of ibuprofen; does not drink alcohol.  Bowel movements occur daily, sometimes twice daily, without straining, hard stools, or blood. Last  colonoscopy over 10 years ago.   Patient hospitalized 06/14/2024-06/18/2024 with nausea, vomiting, diarrhea, generalized weakness. CT abdomen and pelvis with prominent wall thickening of the distal esophagus with distinct nests of the paraesophageal flap, reflux versus esophagitis, recommend correlation with endoscopy to exclude underlying neoplasm. Fluid throughout the small bowel and ascending colon, can be seen with enteritis or diarrheal illness. No bowel obstruction or inflammation. - Cholelithiasis without CT findings of acute cholecystitis. - Colonic diverticulosis without diverticulitis.   RUQ ultrasound - Cholelithiasis without sonographic evidence of acute cholecystitis.       Past Medical History:  Diagnosis Date   CVA (cerebral vascular accident) (HCC)    Hypertension     Past Surgical History:  Procedure Laterality Date   TOTAL HIP ARTHROPLASTY Right     Current Outpatient Medications  Medication Sig Dispense Refill   acetaminophen  (TYLENOL ) 500 MG tablet Take 1,000 mg by mouth every 6 (six) hours as needed for moderate pain (as needed for pain). Take 2 tablets 3x per day as needed for pain     amLODipine  (NORVASC ) 10 MG tablet Take 10 mg by mouth daily.     ascorbic acid  (VITAMIN C) 500 MG tablet Take 500 mg by mouth.     aspirin  EC 81 MG tablet Take 81 mg by mouth daily. Swallow whole.     atorvastatin  (LIPITOR) 80 MG tablet Take 80 mg by mouth daily. Take 1 tablet by mouth daily     cyanocobalamin  (VITAMIN B12) 1000 MCG tablet Take 1,000 mcg by mouth.     ibuprofen (ADVIL) 600 MG tablet Take 600 mg by mouth.     iron  polysaccharides (NIFEREX) 150 MG capsule Take 150 mg by mouth daily.  lacosamide  (VIMPAT ) 200 MG TABS tablet Take 200 mg by mouth 2 (two) times daily.     magnesium  oxide (MAG-OX) 400 MG tablet Take 400 mg by mouth.     omeprazole (PRILOSEC OTC) 20 MG tablet Take 20 mg by mouth daily. Take 1 tablet by mouth daily as needed.     No current  facility-administered medications for this visit.    Allergies as of 08/09/2024   (No Known Allergies)    Family History  Problem Relation Age of Onset   Hypertension Mother     Social History   Socioeconomic History   Marital status: Married    Spouse name: Not on file   Number of children: Not on file   Years of education: Not on file   Highest education level: Not on file  Occupational History   Not on file  Tobacco Use   Smoking status: Every Day    Passive exposure: Current   Smokeless tobacco: Never  Vaping Use   Vaping status: Never Used  Substance and Sexual Activity   Alcohol use: No   Drug use: Never   Sexual activity: Yes  Other Topics Concern   Not on file  Social History Narrative   Not on file   Social Drivers of Health   Tobacco Use: High Risk (08/09/2024)   Patient History    Smoking Tobacco Use: Every Day    Smokeless Tobacco Use: Never    Passive Exposure: Current  Financial Resource Strain: Low Risk  (06/19/2024)   Received [facility]   Overall Financial Resource Strain (CARDIA)    Difficulty of Paying Living Expenses: Not very hard  Food Insecurity: Food Insecurity Present (06/19/2024)   Received from [facility]   Epic    Within the past 12 months, you worried that your food would run out before you got the money to buy more.: Sometimes true    Within the past 12 months, the food you bought just didn't last and you didn't have money to get more.: Sometimes true  Transportation Needs: No Transportation Needs (06/19/2024)   Received from [facility] Transportation    In the past 12 months, has lack of transportation kept you from medical appointments or from getting medications?: No    Lack of Transportation (Non-Medical): No  Physical Activity: Not on file  Stress: Not on file  Social Connections: Socially Isolated (06/15/2024)   Social Connection and Isolation Panel     Frequency of Communication with Friends and Family: Once a week    Frequency of Social Gatherings with Friends and Family: Once a week    Attends Religious Services: Never    Database Administrator or Organizations: No    Attends Banker Meetings: Never    Marital Status: Married  Catering Manager Violence: Not At Risk (06/15/2024)   Epic    Fear of Current or Ex-Partner: No    Emotionally Abused: No    Physically Abused: No    Sexually Abused: No  Depression (PHQ2-9): Not on file  Alcohol Screen: Not on file  Housing: Low Risk (06/15/2024)   Epic    Unable to Pay for Housing in the Last Year: No    Number of Times Moved in the Last Year: 0    Homeless in the Last Year: No  Utilities: Not At Risk (06/15/2024)   Epic    Threatened with loss of utilities: No  Health Literacy: Medium Risk (08/31/2022)   Received from [facility]    How often do you need to have someone help you when you read instructions, pamphlets, or other written material from your doctor or pharmacy?: Rarely     RELEVANT GI HISTORY, IMAGING AND LABS: CBC    Component Value Date/Time   WBC 11.2 (H) 06/18/2024 0313   RBC 5.42 06/18/2024 0313   HGB 14.7 06/18/2024 0313   HCT 46.9 06/18/2024 0313   PLT 259 06/18/2024 0313   MCV 86.5 06/18/2024 0313   MCH 27.1 06/18/2024 0313   MCHC 31.3 06/18/2024 0313   RDW 13.4 06/18/2024 0313   LYMPHSABS 2.6 11/04/2023 2228   MONOABS 0.8 11/04/2023 2228   EOSABS 0.2 11/04/2023 2228   BASOSABS 0.0 11/04/2023 2228   Recent Labs    11/04/23 2228 11/06/23 0350 06/14/24 1829 06/15/24 0641 06/16/24 0933 06/18/24 0313  HGB 12.2* 11.8* 16.3 14.5 15.7 14.7    CMP     Component Value Date/Time   NA 143 06/15/2024 0641   K 3.9 06/15/2024 0641   CL 105 06/15/2024 0641   CO2 26 06/15/2024 0641   GLUCOSE 113 (H) 06/15/2024 0641   BUN 14 06/15/2024 0641   CREATININE 1.02 06/15/2024 0641   CALCIUM  8.9 06/15/2024 0641   PROT 7.4  06/15/2024 0641   ALBUMIN 3.9 06/15/2024 0641   AST 14 (L) 06/15/2024 0641   ALT 12 06/15/2024 0641   ALKPHOS 132 (H) 06/15/2024 0641   BILITOT 0.9 06/15/2024 0641   GFRNONAA >60 06/15/2024 0641   GFRAA >60 09/20/2017 2344      Latest Ref Rng & Units 06/15/2024    6:41 AM 06/14/2024    6:29 PM 11/04/2023   10:28 PM  Hepatic Function  Total Protein 6.5 - 8.1 g/dL 7.4  8.6  8.1   Albumin 3.5 - 5.0 g/dL 3.9  4.5  4.0   AST 15 - 41 U/L 14  16  17    ALT 0 - 44 U/L 12  16  16    Alk Phosphatase 38 - 126 U/L 132  167  99   Total Bilirubin 0.0 - 1.2 mg/dL 0.9  0.9  1.0       Review of Systems   All systems reviewed and negative except where noted in HPI.    Physical Exam  BP (!) 145/83   Pulse 83   Temp 98.6 F (37 C)   Ht 5' 10 (1.778 m)   Wt 220 lb (99.8 kg)   SpO2 98%   BMI 31.57 kg/m  No LMP for male patient. General:   Alert and oriented. Pleasant and cooperative. Well-nourished and well-developed. In no acute distress.  Head:  Normocephalic and atraumatic. Eyes:  Without icterus Ears:  Normal auditory acuity. Neck:  Supple; no masses or thyromegaly. Lungs:  Respirations even and unlabored.  Clear throughout to auscultation.   No wheezes, crackles, or rhonchi. No acute distress. Heart:  Regular rate and rhythm; no murmurs, clicks, rubs, or gallops. Abdomen:  Normal bowel sounds.  No bruits.  Soft, non-tender and non-distended without masses, hepatosplenomegaly or hernias noted.  No guarding or rebound tenderness.    Rectal:  Deferred. Msk:  Symmetrical without gross deformities. Normal posture. Extremities:  Without edema. Neurologic:  Alert and  oriented x4;  grossly normal neurologically. Skin:  Intact without significant lesions or rashes. Psych:  Alert and cooperative. Normal mood and affect.   Assessment & Plan  Mark Durham is a 74 y.o. male presenting today with dysphagia and  GERD.  Dysphagia. 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 Recent CT scan revealing prominent wall thickening of the distal esophagus.  - Schedule EGD with dilatation to evaluate for stenosis, tumor, erosive/infectious esophagititis, and EOE. I discussed risks of EGD with patient today, including risk of sedation, bleeding or perforation.  Patient provides understanding and gave verbal consent to proceed.  Will obtain neurology clearance.  - consider barium swallow -In the interim patient advised about swallowing precautions.  - Eat slowly, chew food well before swallowing.  - Drink liquids in between each bite to avoid food impaction. - ER precautions discussed with the patient   Gastroesophageal reflux disease: well controlled on omeprazole.  Provided dietary counseling to avoid exacerbating foods and limit NSAID use.  Colorectal cancer screening Due for screening; last colonoscopy over ten years ago. - Scheduled colonoscopy with concurrent upper endoscopy.  Follow up in 6 weeks  Grayce Bohr, DNP, AGNP-C [facility]  "

## 2024-08-09 ENCOUNTER — Ambulatory Visit: Admitting: Family Medicine

## 2024-08-09 ENCOUNTER — Encounter: Payer: Self-pay | Admitting: Family Medicine

## 2024-08-09 ENCOUNTER — Telehealth: Payer: Self-pay

## 2024-08-09 VITALS — BP 145/83 | HR 83 | Temp 98.6°F | Ht 70.0 in | Wt 220.0 lb

## 2024-08-09 DIAGNOSIS — R1312 Dysphagia, oropharyngeal phase: Secondary | ICD-10-CM

## 2024-08-09 DIAGNOSIS — R1310 Dysphagia, unspecified: Secondary | ICD-10-CM

## 2024-08-09 DIAGNOSIS — K219 Gastro-esophageal reflux disease without esophagitis: Secondary | ICD-10-CM

## 2024-08-09 DIAGNOSIS — Z1211 Encounter for screening for malignant neoplasm of colon: Secondary | ICD-10-CM

## 2024-08-09 MED ORDER — PEG 3350-KCL-NA BICARB-NACL 420 G PO SOLR: 4000.0000 mL | Freq: Once | ORAL | 0 refills | Status: AC

## 2024-08-09 NOTE — Telephone Encounter (Signed)
 Left VM of appointment listed below-  Barium swallow study scheduled 08-15-24 @ [facility]-arrive at 12:30 pm . No prep.

## 2024-08-09 NOTE — Telephone Encounter (Signed)
 Faxed Procedure clearance to Dr.Clio A. Rubinos [phone] Office # [phone].

## 2024-08-15 ENCOUNTER — Telehealth: Payer: Self-pay

## 2024-08-15 ENCOUNTER — Ambulatory Visit
Admission: RE | Admit: 2024-08-15 | Discharge: 2024-08-15 | Disposition: A | Source: Ambulatory Visit | Attending: Family Medicine | Admitting: Family Medicine

## 2024-08-15 DIAGNOSIS — R1312 Dysphagia, oropharyngeal phase: Secondary | ICD-10-CM

## 2024-08-15 DIAGNOSIS — K219 Gastro-esophageal reflux disease without esophagitis: Secondary | ICD-10-CM

## 2024-08-15 NOTE — Progress Notes (Signed)
 Modified Barium Swallow Study  Patient Details  Name: Mark Durham MRN: [id number] Date of Birth: 08-24-1950  Today's Date: 08/15/2024  Modified Barium Swallow completed.  Full report located under Chart Review in the Imaging Section.  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  Pt was hospitalized at [facility] from December 4 to June 18, 2024 after admitting w/ N/V.  Chief reason for hospitalization was Gastroesophageal symptoms, general weakness. Found to have UTI.  CT Imaging revealed Esophageal wall thickening of the Distal Esophagus.  Per chart notes, he has a long-standing history of REFLUX for which he takes Prilosec, and it is working well. He is awaiting an EGD on 08/29/2024 with a Gastroenterologist.   Pt (and Wife) reported No weight loss; No change in diet. Pt has Dentures but does not wear them. No reported (by Wife/chart) dx'd pneumonia. Wife endorsed pt coughs at meals sometimes but it is Not consistent nor frequent.    Clinical Impression Patient appears to present with functional oropharyngeal phase swallowing in setting of Baseline, Chronic GERD and recently noted Esophageal Wall thickening- Distal(per chart notes 06/2024. ANY Esophageal phase deficits can impact the oropharyngeal phases of swallowing. Pt also has a prior CVA(2023) w/ R sided weakness and memory decline- he responds appropriately w/ Cue given.  NO laryngeal penetration nor aspiration occurred during this study.       Oral phase is c/b functional lip closure, bolus preparation and containment, and anterior to posterior transit. Intermittently, there is escape of thin liquids into the lateral buccal floor of mouth and a slight delay/hesitation in initiation of A-P transfer- moreso w/ liquids, but this was not consistent w/ all trials. Pt is Mostly  Edentulous which can impact effective mastication of solids- more gumming/mashing occurs. Any slight, diffuse buccal residue in the oral cavity cleared w/ pt's dry swallow b/t trials. Swallow initiation occurs primarily at the level of the Valleculae w/ thin liquids inconsistently spilling from the Valleculae during initiation of the swallow.     Pharyngeal phase is noted for functional tongue base retraction, adequate hyolaryngeal excursion, and adequate pharyngeal constriction. Pharyngeal stripping wave is complete. Epiglottic inversion is complete w/ tight/timely airway closure; full glottic closure w/ No laryngeal penetation nor aspiration occurring. Any slight, diffuse pharyngeal (valleculae; p.s.) residue occurring w/ bolus consistencies immediately clears w/ his dry swallow b/t trials. A dry swallowed was Cued intermittently.    Amplitude/duration of cricopharyngeus opening appeared [facility]. There was adequate/complete clearance through the upper cervical Esophagus. No retrograde flow nor stasis.   Video of MBSS was viewed w/ pt immediately afterwards; Education and recommendations discussed and provided to both pt and Wife post study. Factors that may increase risk of adverse event in presence of aspiration Noe [employer] Lianne 2021): GI disease   Swallow Evaluation Recommendations Recommendations: PO diet PO Diet Recommendation: Regular; Dysphagia 3 (Mechanical soft); Thin liquids (Level 0) = (cut/chopped meats; moistened foods for ease of mashing/gumming and Esophageal clearing) Liquid Administration via: Cup Medication Administration: Whole meds with liquid (or Whole in Puree if needed for ease of swallowing) Supervision: Patient able to self-feed;Intermittent supervision/cueing for swallowing strategies; Set-up assistance for safety Swallowing strategie: Minimize environmental distractions; Slow rate;Small bites/sips; Dry swallow after each bite/sip(Cue as needed by Wife); Follow solids with  liquids Postural changes: Position pt fully upright for meals; Stay upright 30-60 min after meals; Out of  bed for meals (GERD precautions) Oral care recommendations: Oral care BID (2x/day); Pt independent with oral care- Staff/trained caregiver to provide oral care (setup) Recommended consults: Consider GI consultation; Consider esophageal assessment = (pending); GERD management         Comer Portugal, MS, CCC-SLP Speech Language Pathologist Rehab Services; [facility] [phone] (ascom) Jaelynn Currier 08/15/2024,6:11 PM

## 2024-08-15 NOTE — Telephone Encounter (Signed)
 Received medical clearance fro Dr.Clio Rubinos -patient cleared to have procedures.

## 2024-08-16 ENCOUNTER — Ambulatory Visit: Payer: Self-pay | Admitting: Family Medicine

## 2024-08-29 ENCOUNTER — Encounter: Admission: RE | Payer: Self-pay | Source: Home / Self Care

## 2024-08-29 ENCOUNTER — Ambulatory Visit: Admission: RE | Admit: 2024-08-29

## 2024-09-20 ENCOUNTER — Ambulatory Visit: Admitting: Family Medicine
